# Patient Record
Sex: Female | Born: 1950 | Race: White | Hispanic: No | Marital: Married | Smoking: Never smoker
Health system: Southern US, Community
[De-identification: ages and names within clinical notes are randomized; demographics above are authoritative.]

## PROBLEM LIST (undated history)

## (undated) DIAGNOSIS — Z800 Family history of malignant neoplasm of digestive organs: Secondary | ICD-10-CM

## (undated) DIAGNOSIS — G500 Trigeminal neuralgia: Secondary | ICD-10-CM

## (undated) DIAGNOSIS — K5900 Constipation, unspecified: Secondary | ICD-10-CM

## (undated) DIAGNOSIS — I4891 Unspecified atrial fibrillation: Secondary | ICD-10-CM

## (undated) DIAGNOSIS — K219 Gastro-esophageal reflux disease without esophagitis: Secondary | ICD-10-CM

## (undated) DIAGNOSIS — R7303 Prediabetes: Secondary | ICD-10-CM

## (undated) DIAGNOSIS — I10 Essential (primary) hypertension: Secondary | ICD-10-CM

## (undated) DIAGNOSIS — M1710 Unilateral primary osteoarthritis, unspecified knee: Secondary | ICD-10-CM

## (undated) DIAGNOSIS — E559 Vitamin D deficiency, unspecified: Secondary | ICD-10-CM

## (undated) DIAGNOSIS — R002 Palpitations: Secondary | ICD-10-CM

## (undated) DIAGNOSIS — Z8042 Family history of malignant neoplasm of prostate: Secondary | ICD-10-CM

## (undated) DIAGNOSIS — N390 Urinary tract infection, site not specified: Secondary | ICD-10-CM

## (undated) DIAGNOSIS — M1990 Unspecified osteoarthritis, unspecified site: Secondary | ICD-10-CM

## (undated) DIAGNOSIS — E079 Disorder of thyroid, unspecified: Secondary | ICD-10-CM

## (undated) DIAGNOSIS — E669 Obesity, unspecified: Secondary | ICD-10-CM

## (undated) DIAGNOSIS — M2550 Pain in unspecified joint: Secondary | ICD-10-CM

## (undated) DIAGNOSIS — E739 Lactose intolerance, unspecified: Secondary | ICD-10-CM

## (undated) DIAGNOSIS — G4733 Obstructive sleep apnea (adult) (pediatric): Secondary | ICD-10-CM

## (undated) DIAGNOSIS — K5790 Diverticulosis of intestine, part unspecified, without perforation or abscess without bleeding: Secondary | ICD-10-CM

## (undated) DIAGNOSIS — K589 Irritable bowel syndrome without diarrhea: Secondary | ICD-10-CM

## (undated) DIAGNOSIS — E7800 Pure hypercholesterolemia, unspecified: Secondary | ICD-10-CM

## (undated) DIAGNOSIS — F419 Anxiety disorder, unspecified: Secondary | ICD-10-CM

## (undated) DIAGNOSIS — Z803 Family history of malignant neoplasm of breast: Secondary | ICD-10-CM

## (undated) DIAGNOSIS — C801 Malignant (primary) neoplasm, unspecified: Secondary | ICD-10-CM

## (undated) DIAGNOSIS — Z808 Family history of malignant neoplasm of other organs or systems: Secondary | ICD-10-CM

## (undated) DIAGNOSIS — K859 Acute pancreatitis without necrosis or infection, unspecified: Secondary | ICD-10-CM

## (undated) DIAGNOSIS — I499 Cardiac arrhythmia, unspecified: Secondary | ICD-10-CM

## (undated) HISTORY — DX: Anxiety disorder, unspecified: F41.9

## (undated) HISTORY — DX: Disorder of thyroid, unspecified: E07.9

## (undated) HISTORY — DX: Pure hypercholesterolemia, unspecified: E78.00

## (undated) HISTORY — DX: Acute pancreatitis without necrosis or infection, unspecified: K85.90

## (undated) HISTORY — PX: KNEE ARTHROSCOPY: SUR90

## (undated) HISTORY — DX: Unspecified atrial fibrillation: I48.91

## (undated) HISTORY — DX: Unilateral primary osteoarthritis, unspecified knee: M17.10

## (undated) HISTORY — DX: Family history of malignant neoplasm of breast: Z80.3

## (undated) HISTORY — DX: Prediabetes: R73.03

## (undated) HISTORY — DX: Obesity, unspecified: E66.9

## (undated) HISTORY — DX: Diverticulosis of intestine, part unspecified, without perforation or abscess without bleeding: K57.90

## (undated) HISTORY — DX: Obstructive sleep apnea (adult) (pediatric): G47.33

## (undated) HISTORY — DX: Constipation, unspecified: K59.00

## (undated) HISTORY — DX: Lactose intolerance, unspecified: E73.9

## (undated) HISTORY — DX: Family history of malignant neoplasm of digestive organs: Z80.0

## (undated) HISTORY — DX: Cardiac arrhythmia, unspecified: I49.9

## (undated) HISTORY — DX: Palpitations: R00.2

## (undated) HISTORY — DX: Vitamin D deficiency, unspecified: E55.9

## (undated) HISTORY — DX: Irritable bowel syndrome, unspecified: K58.9

## (undated) HISTORY — DX: Gastro-esophageal reflux disease without esophagitis: K21.9

## (undated) HISTORY — DX: Essential (primary) hypertension: I10

## (undated) HISTORY — PX: COLONOSCOPY: SHX174

## (undated) HISTORY — DX: Unspecified osteoarthritis, unspecified site: M19.90

## (undated) HISTORY — PX: TONSILLECTOMY: SUR1361

## (undated) HISTORY — DX: Family history of malignant neoplasm of prostate: Z80.42

## (undated) HISTORY — DX: Trigeminal neuralgia: G50.0

## (undated) HISTORY — PX: BLADDER REPAIR: SHX76

## (undated) HISTORY — DX: Pain in unspecified joint: M25.50

## (undated) HISTORY — PX: CEREBRAL MICROVASCULAR DECOMPRESSION: SHX1328

## (undated) HISTORY — PX: ESOPHAGOGASTRODUODENOSCOPY: SHX1529

## (undated) HISTORY — DX: Urinary tract infection, site not specified: N39.0

## (undated) HISTORY — PX: KNEE ARTHROSCOPY: SHX127

## (undated) HISTORY — DX: Family history of malignant neoplasm of other organs or systems: Z80.8

---

## 1978-07-13 HISTORY — PX: ABDOMINAL HYSTERECTOMY: SHX81

## 2019-02-10 ENCOUNTER — Encounter: Payer: Self-pay | Admitting: Internal Medicine

## 2019-03-14 ENCOUNTER — Ambulatory Visit: Payer: Self-pay | Admitting: Internal Medicine

## 2019-04-17 ENCOUNTER — Ambulatory Visit (INDEPENDENT_AMBULATORY_CARE_PROVIDER_SITE_OTHER): Payer: Medicare Other | Admitting: Internal Medicine

## 2019-04-17 ENCOUNTER — Encounter: Payer: Self-pay | Admitting: Internal Medicine

## 2019-04-17 VITALS — BP 124/70 | HR 58 | Temp 98.3°F | Ht 64.0 in | Wt 248.5 lb

## 2019-04-17 DIAGNOSIS — K5732 Diverticulitis of large intestine without perforation or abscess without bleeding: Secondary | ICD-10-CM | POA: Diagnosis not present

## 2019-04-17 DIAGNOSIS — Z800 Family history of malignant neoplasm of digestive organs: Secondary | ICD-10-CM

## 2019-04-17 DIAGNOSIS — R1030 Lower abdominal pain, unspecified: Secondary | ICD-10-CM | POA: Diagnosis not present

## 2019-04-17 DIAGNOSIS — K219 Gastro-esophageal reflux disease without esophagitis: Secondary | ICD-10-CM

## 2019-04-17 DIAGNOSIS — Z7901 Long term (current) use of anticoagulants: Secondary | ICD-10-CM

## 2019-04-17 MED ORDER — DICYCLOMINE HCL 10 MG PO CAPS: 10.0000 mg | ORAL_CAPSULE | Freq: Three times a day (TID) | ORAL | 2 refills | Status: DC | PRN

## 2019-04-17 MED ORDER — SUPREP BOWEL PREP KIT 17.5-3.13-1.6 GM/177ML PO SOLN: 1.0000 | ORAL | 0 refills | Status: DC

## 2019-04-17 MED ORDER — AMOXICILLIN-POT CLAVULANATE 875-125 MG PO TABS: 1.0000 | ORAL_TABLET | Freq: Two times a day (BID) | ORAL | 0 refills | Status: AC

## 2019-04-17 NOTE — Progress Notes (Signed)
Subjective:    Patient ID: Lisa Cardenas, female    DOB: [birth year], 68 y.o.   MRN: [id number]  HPI Lisa Cardenas is a 68 year old female with past medical history of colonic diverticulosis with history of diverticulitis, GERD, history of atrial fibrillation currently on Xarelto, history of hypertension, history of hyperlipidemia, history of thyroid disease, obesity who is seen to establish care for colon cancer screening but also to discuss lower abdominal pain and loose stools.  She is here alone today.  She reports that for the past week she has been having lower abdominal pain and discomfort.  She reports she generally just does not feel well.  She has noted some urgency and looser stools.  Occurring once or twice a day.  Worse with heavy or fatty foods.  She has lost about 10 pounds.  No blood in her stool or melena.  In the past she has been treated with Cipro and metronidazole for similar symptoms and her symptoms have improved.  She reports her reflux is under good control on Nexium 40 mg daily.  She will occasionally use a second dose if heartburn is troublesome.  No dysphagia or odynophagia.  Her mother had colon cancer in her late 53s.  She also has 2 aunts on her maternal side who have had colon cancer.  Her last colonoscopy was performed by Dr. West Carbo on 09/17/2010.  This showed no polypoid disease or colitis.  Diverticulosis in the sigmoid.  She had an EGD performed also on 09/17/2010 which showed a 5 cm hiatal hernia with erosions and marked reflux.  Biopsies were performed for Barrett's esophagus.  These showed mild chronic cardio gastritis and esophagitis.  There was no evidence for Barrett's esophagus.   Review of Systems As per HPI, otherwise negative  Current Medications, Allergies, Past Medical History, Past Surgical History, Family History and Social History were reviewed in [employer] record.     Objective:   Physical Exam BP 124/70   Pulse  (!) 58   Temp 98.3 F (36.8 C)   Ht 5\' 4"  (1.626 m)   Wt 248 lb 8 oz (112.7 kg)   BMI 42.65 kg/m  Gen: awake, alert, NAD HEENT: anicteric, op clear CV: RRR, no mrg Pulm: CTA b/l Abd: soft, obese, mild lower abdominal tenderness with deep palpation, nondistended, +BS throughout Ext: no c/c/e Neuro: nonfocal      Assessment & Plan:  68 year old female with past medical history of colonic diverticulosis with history of diverticulitis, GERD, history of atrial fibrillation currently on Xarelto, history of hypertension, history of hyperlipidemia, history of thyroid disease, obesity who is seen to establish care for colon cancer screening but also to discuss lower abdominal pain and loose stools.   1.  Lower abdominal pain/history of diverticulitis --she may have early mild diverticulitis.  I am going to treat her for this prior to screening colonoscopy. --Augmentin 875 mg twice daily x7 days --Bentyl 10 to 20 mg every 8 hours as needed lower abdominal crampy pain --She is asked to notify me if symptoms fail to improve  2.  Family history of colon cancer --screening colonoscopy recommended after treatment for diverticulitis.  We discussed the risk, benefits and alternatives and she is agreeable wishes to proceed.  Her Xarelto will need to be held and we will contact her cardiology who prescribes this medication. --Colonoscopy for screening  Will hold Xarelto 2 days prior to endoscopic procedures - will instruct when and how to resume after procedure.  Benefits and risks of procedure explained including risks of bleeding, perforation, infection, missed lesions, reactions to medications and possible need for hospitalization and surgery for complications. Additional rare but real risk of stroke or other vascular clotting events off Xarelto also explained and need to seek urgent help if any signs of these problems occur. Will communicate by phone or EMR with patient's  prescribing provider to confirm  that holding Xarelto is reasonable in this case.   3.  GERD with history of hiatal hernia --no alarm symptoms at present.  She had an upper endoscopy in 2012 without evidence of Barrett's esophagus, biopsies were performed and negative for Barrett's.  Continue Nexium 40 mg daily

## 2019-04-17 NOTE — Patient Instructions (Addendum)
We have sent the following medications to your pharmacy for you to pick up at your convenience: Augmentin 875 mg twice daily x 7 days Bentyl 10-20 mg every 8 hours as needed for lower abdominal pain/spasm  You have been scheduled for a colonoscopy. Please follow written instructions given to you at your visit today.  Please pick up your prep supplies at the pharmacy within the next 1-3 days. If you use inhalers (even only as needed), please bring them with you on the day of your procedure.  If you are age 7 or older, your body mass index should be between 23-30. Your Body mass index is 42.65 kg/m. If this is out of the aforementioned range listed, please consider follow up with your Primary Care Provider.  If you are age 9 or younger, your body mass index should be between 19-25. Your Body mass index is 42.65 kg/m. If this is out of the aformentioned range listed, please consider follow up with your Primary Care Provider.

## 2019-04-24 ENCOUNTER — Telehealth: Payer: Self-pay | Admitting: Internal Medicine

## 2019-04-24 NOTE — Telephone Encounter (Signed)
Pt reported that she is still experiencing diarrhea and pain on her right side.  Please advise.

## 2019-04-25 NOTE — Telephone Encounter (Signed)
If pain is now gone, then proceed to colonoscopy as scheduled If pain returns before this, I recommend CT scan abd/pelvis with contrast

## 2019-04-25 NOTE — Telephone Encounter (Signed)
Pt states she had some diarrhea and pain on her right side yesterday but today she is better. Discussed with her she can take Imodium OTC for diarrhea. Pt finished the augmentin that she was given and states she does feel better since completing the antibiotic. Let her know this information will be sent to Dr. Hilarie Fredrickson and if he has any further recommendations she will be called back.

## 2019-04-26 ENCOUNTER — Telehealth: Payer: Self-pay | Admitting: *Deleted

## 2019-04-26 NOTE — Telephone Encounter (Signed)
Pt scheduled to see Dr. Hilarie Fredrickson 05/11/19@10 :10am. Pt aware of appt.

## 2019-04-26 NOTE — Telephone Encounter (Signed)
Dr Donn Pierini from [facility] sent fax on 04/26/19 with the following:  "Dear [facility],  This is to inform you that the above mentioned patient has been cleared for her upcoming procedure. I feel this patient is low risk for a cardiac standpoint to proceed with colonoscopy with moderate sedation. She should hold her Xarelto 48 hours prior to the procedure and resume it 24 hours after the procedure.  Please do not hesitate to call me if I could be of further assistance.  Sincerely, Susie Cassette, MD [facility]"  I have spoken to patient to advise that per Dr Dorrene German, she may hold Xarelto 48 hours prior to her procedure and resume 24 hours after as long as okayed by Dr Hilarie Fredrickson. She verbalizes understanding.  Please look under "media" tab in Epic for original documentation.

## 2019-05-25 ENCOUNTER — Other Ambulatory Visit: Payer: Self-pay

## 2019-05-25 ENCOUNTER — Ambulatory Visit (AMBULATORY_SURGERY_CENTER): Payer: Medicare Other | Admitting: Internal Medicine

## 2019-05-25 ENCOUNTER — Encounter: Payer: Self-pay | Admitting: Internal Medicine

## 2019-05-25 VITALS — BP 110/49 | HR 56 | Temp 97.9°F | Resp 14 | Ht 64.0 in | Wt 248.0 lb

## 2019-05-25 DIAGNOSIS — Z800 Family history of malignant neoplasm of digestive organs: Secondary | ICD-10-CM | POA: Diagnosis present

## 2019-05-25 DIAGNOSIS — D120 Benign neoplasm of cecum: Secondary | ICD-10-CM

## 2019-05-25 DIAGNOSIS — D122 Benign neoplasm of ascending colon: Secondary | ICD-10-CM

## 2019-05-25 DIAGNOSIS — D124 Benign neoplasm of descending colon: Secondary | ICD-10-CM | POA: Diagnosis not present

## 2019-05-25 MED ORDER — SODIUM CHLORIDE 0.9 % IV SOLN: 500.0000 mL | Freq: Once | INTRAVENOUS | Status: DC

## 2019-05-25 NOTE — Progress Notes (Signed)
To PACU, VSS. Report to [email or web address] 

## 2019-05-25 NOTE — Progress Notes (Signed)
Called to room to assist during endoscopic procedure.  Patient ID and intended procedure confirmed with present staff. Received instructions for my participation in the procedure from the performing physician.  

## 2019-05-25 NOTE — Op Note (Signed)
[place] Patient Name: Lisa Cardenas Procedure Date: 05/25/2019 1:41 PM MRN: [id number] Endoscopist: Jerene Bears , MD Age: 68 Referring MD:  Date of Birth: 01/06/1951 Gender: Female Account #: [id number] Procedure:                Colonoscopy Indications:              Screening in patient at increased risk: Family                            history of 1st-degree relative with colorectal                            cancer before age 51 years, Last colonoscopy: 2012 Medicines:                Monitored Anesthesia Care Procedure:                Pre-Anesthesia Assessment:                           - Prior to the procedure, a History and Physical                            was performed, and patient medications and                            allergies were reviewed. The patient's tolerance of                            previous anesthesia was also reviewed. The risks                            and benefits of the procedure and the sedation                            options and risks were discussed with the patient.                            All questions were answered, and informed consent                            was obtained. Prior Anticoagulants: The patient has                            taken Xarelto (rivaroxaban), last dose was 2 days                            prior to procedure. ASA Grade Assessment: III - A                            patient with severe systemic disease. After                            reviewing the risks and benefits, the patient was  deemed in satisfactory condition to undergo the                            procedure.                           After obtaining informed consent, the colonoscope                            was passed under direct vision. Throughout the                            procedure, the patient's blood pressure, pulse, and                            oxygen saturations were monitored continuously. The                             Colonoscope was introduced through the anus and                            advanced to the cecum, identified by appendiceal                            orifice and ileocecal valve. The colonoscopy was                            performed without difficulty. The patient tolerated                            the procedure well. The quality of the bowel                            preparation was good. The ileocecal valve,                            appendiceal orifice, and rectum were photographed. Scope In: 1:44:51 PM Scope Out: 2:10:31 PM Scope Withdrawal Time: 0 hours 22 minutes 24 seconds  Total Procedure Duration: 0 hours 25 minutes 40 seconds  Findings:                 The digital rectal exam was normal.                           Three sessile polyps were found in the cecum. The                            polyps were 3 to 5 mm in size. These polyps were                            removed with a cold snare. Resection and retrieval                            were complete.  A 4 mm polyp was found in the ileocecal valve. The                            polyp was sessile. The polyp was removed with a                            cold biopsy forceps. Resection and retrieval were                            complete.                           Two sessile polyps were found in the ascending                            colon. The polyps were 4 to 5 mm in size. These                            polyps were removed with a cold snare. Resection                            and retrieval were complete.                           Two sessile polyps were found in the descending                            colon. The polyps were 3 to 4 mm in size. These                            polyps were removed with a cold snare. Resection                            and retrieval were complete.                           Multiple small-mouthed diverticula were found in                             the sigmoid colon. Petechia were visualized in                            association with the diverticular opening.                           Internal hemorrhoids were found during                            retroflexion. The hemorrhoids were small. Complications:            No immediate complications. Estimated Blood Loss:     Estimated blood loss was minimal. Impression:               - Three 3 to 5 mm polyps in the cecum, removed  with                            a cold snare. Resected and retrieved.                           - One 4 mm polyp at the ileocecal valve, removed                            with a cold biopsy forceps. Resected and retrieved.                           - Two 4 to 5 mm polyps in the ascending colon,                            removed with a cold snare. Resected and retrieved.                           - Two 3 to 4 mm polyps in the descending colon,                            removed with a cold snare. Resected and retrieved.                           - Moderate diverticulosis in the sigmoid colon.                            Petechia were visualized in association with the                            diverticular opening.                           - Internal hemorrhoids. Recommendation:           - Patient has a contact number available for                            emergencies. The signs and symptoms of potential                            delayed complications were discussed with the                            patient. Return to normal activities tomorrow.                            Written discharge instructions were provided to the                            patient.                           - Resume previous diet.                           -  Continue present medications.                           - Await pathology results.                           - Repeat colonoscopy is recommended for                            surveillance. The  colonoscopy date will be                            determined after pathology results from today's                            exam become available for review. Jerene Bears, MD 05/25/2019 2:19:00 PM This report has been signed electronically.

## 2019-05-25 NOTE — Progress Notes (Signed)
JB- Temp CW- Vitals 

## 2019-05-25 NOTE — Patient Instructions (Addendum)
PLease see handouts given to you on Polyps, Diverticulosis and Hemorrhoids. Resume xarelto tomorrow.     YOU HAD AN ENDOSCOPIC PROCEDURE TODAY AT [facility]:   Refer to the procedure report that was given to you for any specific questions about what was found during the examination.  If the procedure report does not answer your questions, please call your gastroenterologist to clarify.  If you requested that your care partner not be given the details of your procedure findings, then the procedure report has been included in a sealed envelope for you to review at your convenience later.  YOU SHOULD EXPECT: Some feelings of bloating in the abdomen. Passage of more gas than usual.  Walking can help get rid of the air that was put into your GI tract during the procedure and reduce the bloating. If you had a lower endoscopy (such as a colonoscopy or flexible sigmoidoscopy) you may notice spotting of blood in your stool or on the toilet paper. If you underwent a bowel prep for your procedure, you may not have a normal bowel movement for a few days.  Please Note:  You might notice some irritation and congestion in your nose or some drainage.  This is from the oxygen used during your procedure.  There is no need for concern and it should clear up in a day or so.  SYMPTOMS TO REPORT IMMEDIATELY:   Following lower endoscopy (colonoscopy or flexible sigmoidoscopy):  Excessive amounts of blood in the stool  Significant tenderness or worsening of abdominal pains  Swelling of the abdomen that is new, acute  Fever of 100F or higher   For urgent or emergent issues, a gastroenterologist can be reached at any hour by calling [phone].   DIET:  We do recommend a small meal at first, but then you may proceed to your regular diet.  Drink plenty of fluids but you should avoid alcoholic beverages for 24 hours.  ACTIVITY:  You should plan to take it easy for the rest of today and you  should NOT DRIVE or use heavy machinery until tomorrow (because of the sedation medicines used during the test).    FOLLOW UP: Our staff will call the number listed on your records 48-72 hours following your procedure to check on you and address any questions or concerns that you may have regarding the information given to you following your procedure. If we do not reach you, we will leave a message.  We will attempt to reach you two times.  During this call, we will ask if you have developed any symptoms of COVID 19. If you develop any symptoms (ie: fever, flu-like symptoms, shortness of breath, cough etc.) before then, please call [phone].  If you test positive for Covid 19 in the 2 weeks post procedure, please call and report this information to [place].    If any biopsies were taken you will be contacted by phone or by letter within the next 1-3 weeks.  Please call us at [phone] if you have not heard about the biopsies in 3 weeks.    SIGNATURES/CONFIDENTIALITY: You and/or your care partner have signed paperwork which will be entered into your electronic medical record.  These signatures attest to the fact that that the information above on your After Visit Summary has been reviewed and is understood.  Full responsibility of the confidentiality of this discharge information lies with you and/or your care-partner.

## 2019-05-27 ENCOUNTER — Other Ambulatory Visit: Payer: Self-pay | Admitting: Internal Medicine

## 2019-05-29 ENCOUNTER — Telehealth: Payer: Self-pay

## 2019-05-29 NOTE — Telephone Encounter (Signed)
  Follow up Call-  Call back number 05/25/2019  Post procedure Call Back phone  # [phone]  Permission to leave phone message No  Some recent data might be hidden     Patient questions:  Do you have a fever, pain , or abdominal swelling? No. Pain Score  0 *  Have you tolerated food without any problems? Yes.    Have you been able to return to your normal activities? Yes.    Do you have any questions about your discharge instructions: Diet   No. Medications  No. Follow up visit  No.  Do you have questions or concerns about your Care? No.  Actions: * If pain score is 4 or above: No action needed, pain <4.  1. Have you developed a fever since your procedure? no  2.   Have you had an respiratory symptoms (SOB or cough) since your procedure? no  3.   Have you tested positive for COVID 19 since your procedure no  4.   Have you had any family members/close contacts diagnosed with the COVID 19 since your procedure?  no   If yes to any of these questions please route to Joylene John, RN and Alphonsa Gin, Therapist, sports.

## 2019-05-30 ENCOUNTER — Encounter: Payer: Self-pay | Admitting: Internal Medicine

## 2019-06-26 ENCOUNTER — Telehealth: Payer: Self-pay | Admitting: Internal Medicine

## 2019-06-26 MED ORDER — AMOXICILLIN-POT CLAVULANATE 875-125 MG PO TABS: 1.0000 | ORAL_TABLET | Freq: Two times a day (BID) | ORAL | 0 refills | Status: AC

## 2019-06-26 MED ORDER — METRONIDAZOLE 250 MG PO TABS: 250.0000 mg | ORAL_TABLET | Freq: Three times a day (TID) | ORAL | 0 refills | Status: DC

## 2019-06-26 NOTE — Telephone Encounter (Signed)
The pt is calling with continued lower abd pain and cramping.  She has a history of diverticulitis. Colon done on 11/12.  She would like to have abx called in for diverticulitis.  Please advise

## 2019-06-26 NOTE — Telephone Encounter (Signed)
Cipro 500 mg BID Flagyl 250 mg TID  --both x 7 days  She should call if symptoms not better and completely resolved

## 2019-06-26 NOTE — Telephone Encounter (Signed)
The pt has been advised and flagyl prescription was cancelled.

## 2019-06-26 NOTE — Telephone Encounter (Signed)
The pt has been advised but states she has afib and cannot take cipro.  She says she was given augmentin with the last flare she had with the flagyl. Please advise

## 2019-06-26 NOTE — Telephone Encounter (Signed)
Ok change to augmentin 850 mg BID x 7 days (this will replace both cipro and flagyl)

## 2019-10-06 ENCOUNTER — Other Ambulatory Visit: Payer: Self-pay | Admitting: General Surgery

## 2019-10-25 ENCOUNTER — Telehealth: Payer: Self-pay | Admitting: Oncology

## 2019-10-25 NOTE — Telephone Encounter (Signed)
Received an urgent referral from Dr. Donne Hazel for a dx of breast cancer. Pt has been cld and scheduled to see Dr. Jana Hakim on 4/15 at 4pm w/labs at 330pm. Pt aware to arrive 15 minutes early.

## 2019-10-26 ENCOUNTER — Other Ambulatory Visit: Payer: Self-pay | Admitting: Licensed Clinical Social Worker

## 2019-10-26 ENCOUNTER — Telehealth: Payer: Self-pay | Admitting: Oncology

## 2019-10-26 ENCOUNTER — Encounter: Payer: Self-pay | Admitting: Licensed Clinical Social Worker

## 2019-10-26 ENCOUNTER — Other Ambulatory Visit: Payer: Self-pay | Admitting: General Surgery

## 2019-10-26 ENCOUNTER — Encounter: Payer: Self-pay | Admitting: *Deleted

## 2019-10-26 ENCOUNTER — Inpatient Hospital Stay: Payer: Medicare Other

## 2019-10-26 ENCOUNTER — Other Ambulatory Visit: Payer: Self-pay

## 2019-10-26 ENCOUNTER — Inpatient Hospital Stay (HOSPITAL_BASED_OUTPATIENT_CLINIC_OR_DEPARTMENT_OTHER): Payer: Medicare Other | Admitting: Licensed Clinical Social Worker

## 2019-10-26 ENCOUNTER — Telehealth: Payer: Self-pay | Admitting: *Deleted

## 2019-10-26 ENCOUNTER — Inpatient Hospital Stay: Payer: Medicare Other | Attending: Oncology | Admitting: Oncology

## 2019-10-26 ENCOUNTER — Other Ambulatory Visit: Payer: Self-pay | Admitting: *Deleted

## 2019-10-26 ENCOUNTER — Other Ambulatory Visit: Payer: Medicare Other

## 2019-10-26 VITALS — BP 155/70 | HR 58 | Temp 98.7°F | Resp 18 | Ht 64.0 in | Wt 240.3 lb

## 2019-10-26 DIAGNOSIS — E7800 Pure hypercholesterolemia, unspecified: Secondary | ICD-10-CM | POA: Insufficient documentation

## 2019-10-26 DIAGNOSIS — C50412 Malignant neoplasm of upper-outer quadrant of left female breast: Secondary | ICD-10-CM

## 2019-10-26 DIAGNOSIS — Z803 Family history of malignant neoplasm of breast: Secondary | ICD-10-CM

## 2019-10-26 DIAGNOSIS — K219 Gastro-esophageal reflux disease without esophagitis: Secondary | ICD-10-CM

## 2019-10-26 DIAGNOSIS — Z800 Family history of malignant neoplasm of digestive organs: Secondary | ICD-10-CM

## 2019-10-26 DIAGNOSIS — I4891 Unspecified atrial fibrillation: Secondary | ICD-10-CM | POA: Diagnosis not present

## 2019-10-26 DIAGNOSIS — Z170 Estrogen receptor positive status [ER+]: Secondary | ICD-10-CM | POA: Diagnosis not present

## 2019-10-26 DIAGNOSIS — Z6841 Body Mass Index (BMI) 40.0 and over, adult: Secondary | ICD-10-CM | POA: Insufficient documentation

## 2019-10-26 DIAGNOSIS — Z8042 Family history of malignant neoplasm of prostate: Secondary | ICD-10-CM | POA: Insufficient documentation

## 2019-10-26 DIAGNOSIS — Z79899 Other long term (current) drug therapy: Secondary | ICD-10-CM | POA: Insufficient documentation

## 2019-10-26 DIAGNOSIS — Z808 Family history of malignant neoplasm of other organs or systems: Secondary | ICD-10-CM

## 2019-10-26 DIAGNOSIS — Z90710 Acquired absence of both cervix and uterus: Secondary | ICD-10-CM

## 2019-10-26 DIAGNOSIS — I482 Chronic atrial fibrillation, unspecified: Secondary | ICD-10-CM

## 2019-10-26 DIAGNOSIS — C50812 Malignant neoplasm of overlapping sites of left female breast: Secondary | ICD-10-CM | POA: Diagnosis present

## 2019-10-26 DIAGNOSIS — Z7901 Long term (current) use of anticoagulants: Secondary | ICD-10-CM | POA: Insufficient documentation

## 2019-10-26 DIAGNOSIS — Z8616 Personal history of COVID-19: Secondary | ICD-10-CM

## 2019-10-26 LAB — CBC WITH DIFFERENTIAL (CANCER CENTER ONLY)
Abs Immature Granulocytes: 0.01 10*3/uL (ref 0.00–0.07)
Basophils Absolute: 0.1 10*3/uL (ref 0.0–0.1)
Basophils Relative: 1 %
Eosinophils Absolute: 0.1 10*3/uL (ref 0.0–0.5)
Eosinophils Relative: 2 %
HCT: 44 % (ref 36.0–46.0)
Hemoglobin: 14.2 g/dL (ref 12.0–15.0)
Immature Granulocytes: 0 %
Lymphocytes Relative: 24 %
Lymphs Abs: 1.6 10*3/uL (ref 0.7–4.0)
MCH: 28.1 pg (ref 26.0–34.0)
MCHC: 32.3 g/dL (ref 30.0–36.0)
MCV: 87 fL (ref 80.0–100.0)
Monocytes Absolute: 0.3 10*3/uL (ref 0.1–1.0)
Monocytes Relative: 4 %
Neutro Abs: 4.6 10*3/uL (ref 1.7–7.7)
Neutrophils Relative %: 69 %
Platelet Count: 219 10*3/uL (ref 150–400)
RBC: 5.06 MIL/uL (ref 3.87–5.11)
RDW: 14.7 % (ref 11.5–15.5)
WBC Count: 6.7 10*3/uL (ref 4.0–10.5)
nRBC: 0 % (ref 0.0–0.2)

## 2019-10-26 LAB — CMP (CANCER CENTER ONLY)
ALT: 15 U/L (ref 0–44)
AST: 11 U/L — ABNORMAL LOW (ref 15–41)
Albumin: 4.1 g/dL (ref 3.5–5.0)
Alkaline Phosphatase: 79 U/L (ref 38–126)
Anion gap: 8 (ref 5–15)
BUN: 12 mg/dL (ref 8–23)
CO2: 30 mmol/L (ref 22–32)
Calcium: 10 mg/dL (ref 8.9–10.3)
Chloride: 100 mmol/L (ref 98–111)
Creatinine: 0.78 mg/dL (ref 0.44–1.00)
GFR, Est AFR Am: >60 mL/min (ref >60)
GFR, Estimated: >60 mL/min (ref >60)
Glucose, Bld: 123 mg/dL — ABNORMAL HIGH (ref 70–99)
Potassium: 3.6 mmol/L (ref 3.5–5.1)
Sodium: 138 mmol/L (ref 135–145)
Total Bilirubin: 0.4 mg/dL (ref 0.3–1.2)
Total Protein: 7.4 g/dL (ref 6.5–8.1)

## 2019-10-26 LAB — GENETIC SCREENING ORDER

## 2019-10-26 NOTE — Progress Notes (Signed)
[place]  Telephone:(336) [phone] Fax:(336) [phone]     ID: MARIANN PALO DOB: 11-25-1950  MR#: [id number]  OQH#:[id number]  Patient Care Team: Moshe Cipro, MD as PCP - General (Internal Medicine) Rolm Bookbinder, MD as Consulting Physician (General Surgery) Ziasia Lenoir, Virgie Dad, MD as Consulting Physician (Oncology) Chauncey Cruel, MD OTHER MD:  CHIEF COMPLAINT: Estrogen receptor positive lobular breast cancer  CURRENT TREATMENT: Awaiting definitive surgery   HISTORY OF CURRENT ILLNESS: ZAKIYAH DIOP [pronounced [id number]] had routine screening mammography on 08/10/2019 showing a possible abnormality in the left breast. She underwent left diagnostic mammography with tomography and left breast ultrasonography in [place], [place] on 09/11/2019 showing: scattered fibroglandular densities asymmetry in upper-outer left breast with no definitive sonographic correlate.  Accordingly on 10/06/2019 she proceeded to biopsy of the left breast area in question. The pathology from this procedure ([id number] at [facility] in [place]) ) showed: invasive lobular carcinoma, E-cadherin negative, grade 1. Prognostic indicators significant for: estrogen receptor, greater than 90% positive and progesterone receptor, greater than 90% positive, both with strong staining intensity proliferation marker Ki67 at 5-7%. HER2 negative by immunohistochemistry.  The patient's subsequent history is as detailed below.   INTERVAL HISTORY: Tya was evaluated in the breast cancer clinic on 10/26/2019 accompanied by her husband Clair Gulling.  She has previously met with Dr. Donne Hazel who is setting her up for breast MRI.  Of note she was diagnosed with Covid-19 on 09/03/2019. She has since received the J&J vaccine.   REVIEW OF SYSTEMS: There were no specific symptoms leading to the original mammogram, which was routinely scheduled. The patient denies unusual headaches, visual changes, nausea,  vomiting, stiff neck, dizziness, or gait imbalance. There has been no cough, phlegm production, or pleurisy, no chest pain or pressure, and no change in bowel or bladder habits. The patient denies fever, rash, bleeding, unexplained fatigue or unexplained weight loss.  She has some knee pain problems and this limits her exercise.  She enjoys swimming but has not been able to do that during the pandemic.  A detailed review of systems was otherwise entirely negative.   PAST MEDICAL HISTORY: Past Medical History:  Diagnosis Date  . A-fib [facility])    no issues since May 2020  . Arrhythmia   . Diverticulosis   . Elevated cholesterol   . Family history of breast cancer   . Family history of melanoma   . Family history of pancreatic cancer   . Family history of prostate cancer   . Hypertension   . Obesity   . Pancreatitis   . Thyroid disease   . Trigeminal neuralgia   . UTI (urinary tract infection)   Chronic anticoagulation secondary to atrial fibrillation, GERD, irritable bowel syndrome  PAST SURGICAL HISTORY: Past Surgical History:  Procedure Laterality Date  . ABDOMINAL HYSTERECTOMY  [facility]  [facility]  [facility]OS[facility]     [facility]  . KNEE ARTHROSCOPY Left   . TONSILLECTOMY    Ovaries still in place, right fallopian tube removed, status post cystocele repair  FAMILY HISTORY: Family History  Problem Relation Age of Onset  . Colon cancer Mother        dx 47  . Breast cancer Maternal Grandmother   . Breast cancer Maternal Aunt   . Prostate cancer Maternal Uncle   . Breast cancer Paternal Aunt   . Melanoma Paternal Uncle   . Breast cancer Maternal Aunt   . Leukemia Maternal Uncle   .  Breast cancer Paternal Aunt   . Colon cancer Paternal Aunt   . Melanoma Daughter   The family history is best documented in the genetics note but in brief the patient's father died at the age of 74 from sepsis and the patient's mother at the age of 46 from COPD.  The patient's mother  had colon cancer.  The maternal grandmother had breast cancer in her late 53s and 2 maternal aunts had breast cancer and 1 maternal cousin had breast cancer and later developed pancreatic cancer.  On the father's side 2 paternal aunts had breast cancer, one paternal aunt had colon cancer, all older than 35 years old, 1 paternal uncle had leukemia and one paternal uncle had prostate cancer   GYNECOLOGIC HISTORY:  No LMP recorded. Menarche: 69 years old Age at first live birth: 69 years old [place] P 3 LMP hysterectomy age 61 Contraceptive used oral contraceptives only a few months, without complication HRT estrogen only for 7 to 10 years  Hysterectomy? yes BSO?  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  Her husband of more than 58 years, Clair Gulling, has a Oceanographer in Engineer, mining and work in Social research officer, government for [employer].  He is retired and now Health and safety inspector.  Daughter Cyril Mourning lives in [place] and owns and runs a center for pediatric treatment as well as a school for autistic children's.  Daughter Loree Fee in [place] has a Oceanographer in Animal nutritionist studies and teaches teaching at [employer]; daughter Janett Billow lives in [place] and works at [employer] base there.  The patient has 3 grandchildren.  Is a member of the [employer] denomination.  ADVANCED DIRECTIVES: In the absence of any documents to the contrary the patient's husband is her healthcare power of attorney   HEALTH MAINTENANCE: Social History   Tobacco Use  . Smoking status: Never Smoker  . Smokeless tobacco: Never Used  Substance Use Topics  . Alcohol use: Not Currently  . Drug use: Not Currently     Colonoscopy: October 2020/ [facility]  PAP: 2016  Bone density: Remote   Allergies  Allergen Reactions  . Plaquenil [Hydroxychloroquine]     Increased heart rate    Current Outpatient Medications  Medication Sig Dispense Refill  .  carbamazepine (TEGRETOL) 200 MG tablet Take 200 mg by mouth daily with breakfast. 1/2 tablet daily    . esomeprazole (NEXIUM) 40 MG capsule Take 40 mg by mouth 2 (two) times daily as needed.    . gabapentin (NEURONTIN) 300 MG capsule Take 300 mg by mouth daily.    Marland Kitchen levothyroxine (SYNTHROID) 125 MCG tablet Take 137 mcg by mouth daily. Current dose 137 mcg daily    . LORazepam (ATIVAN) 0.5 MG tablet Take 0.5 mg by mouth as needed for anxiety.    . metoprolol succinate (TOPROL-XL) 100 MG 24 hr tablet Take 100 mg by mouth 2 (two) times daily.    Marland Kitchen olmesartan-hydrochlorothiazide (BENICAR HCT) 20-12.5 MG tablet Take 1 tablet by mouth daily.    . primidone (MYSOLINE) 50 MG tablet Take 50 mg by mouth daily.     . rivaroxaban (XARELTO) 20 MG TABS tablet Take 20 mg by mouth daily.    . sertraline (ZOLOFT) 50 MG tablet Take 50 mg by mouth daily.    . simvastatin (ZOCOR) 40 MG tablet Take 40 mg by mouth daily.     No current facility-administered medications for this  visit.    OBJECTIVE: White woman who appears younger than stated age  69:   10/26/19 1550  BP: (!) 155/70  Pulse: (!) 58  Resp: 18  Temp: 98.7 F (37.1 C)  SpO2: 93%     Body mass index is 41.25 kg/m.   Wt Readings from Last 3 Encounters:  10/26/19 240 lb 4.8 oz (109 kg)  05/25/19 248 lb (112.5 kg)  04/17/19 248 lb 8 oz (112.7 kg)      ECOG FS:1 - Symptomatic but completely ambulatory  Ocular: Sclerae unicteric, pupils round and equal Ear-nose-throat: Wearing a mask Lymphatic: No cervical or supraclavicular adenopathy Lungs no rales or rhonchi Heart regular rate and rhythm Abd soft, nontender, positive bowel sounds MSK no focal spinal tenderness, no joint edema Neuro: non-focal, well-oriented, appropriate affect Breasts: The right breast is unremarkable.  The left breast is status post recent biopsy.  There is a minimal ecchymosis.  There is no palpable mass.  There are no skin or nipple changes of concern.  Both  axillae are benign.   LAB RESULTS:  CMP     Component Value Date/Time   NA 138 10/26/2019 1507   K 3.6 10/26/2019 1507   CL 100 10/26/2019 1507   CO2 30 10/26/2019 1507   GLUCOSE 123 (H) 10/26/2019 1507   BUN 12 10/26/2019 1507   CREATININE 0.78 10/26/2019 1507   CALCIUM 10.0 10/26/2019 1507   PROT 7.4 10/26/2019 1507   ALBUMIN 4.1 10/26/2019 1507   AST 11 (L) 10/26/2019 1507   ALT 15 10/26/2019 1507   ALKPHOS 79 10/26/2019 1507   BILITOT 0.4 10/26/2019 1507   GFRNONAA >60 10/26/2019 1507   GFRAA >60 10/26/2019 1507    No results found for: TOTALPROTELP, ALBUMINELP, A1GS, A2GS, BETS, BETA2SER, GAMS, MSPIKE, SPEI  Lab Results  Component Value Date   WBC 6.7 10/26/2019   NEUTROABS 4.6 10/26/2019   HGB 14.2 10/26/2019   HCT 44.0 10/26/2019   MCV 87.0 10/26/2019   PLT 219 10/26/2019    No results found for: LABCA2  No components found for: EQASTM196  No results for input(s): INR in the last 168 hours.  No results found for: LABCA2  No results found for: QIW979  No results found for: GXQ119  No results found for: ERD408  No results found for: CA2729  No components found for: HGQUANT  No results found for: CEA1 / No results found for: CEA1   No results found for: AFPTUMOR  No results found for: CHROMOGRNA  No results found for: KPAFRELGTCHN, LAMBDASER, KAPLAMBRATIO (kappa/lambda light chains)  No results found for: HGBA, HGBA2QUANT, HGBFQUANT, HGBSQUAN (Hemoglobinopathy evaluation)   No results found for: LDH  No results found for: IRON, TIBC, IRONPCTSAT (Iron and TIBC)  No results found for: FERRITIN  Urinalysis No results found for: COLORURINE, APPEARANCEUR, LABSPEC, PHURINE, GLUCOSEU, HGBUR, BILIRUBINUR, KETONESUR, PROTEINUR, UROBILINOGEN, NITRITE, LEUKOCYTESUR   STUDIES: No results found.   ELIGIBLE FOR AVAILABLE RESEARCH PROTOCOL:AET  ASSESSMENT: 69 y.o.  [place], [place] woman status post left breast overlapping sites biopsy 10/06/2019  for a clinical TXN0, stage IA invasive lobular carcinoma, grade 1, E-cadherin negative, estrogen and progesterone receptor positive, HER-2 not amplified, with an MIB-1 of 5-7%.  (a) breast MRI  (1) genetics testing 10/26/2019  (2) definitive surgery pending  (3) chemotherapy not anticipated: Oncotype may be requested  (4) adjuvant radiation  (5) to start anastrozole at the completion of local treatment.    PLAN: I met today with Ellarose to review  her new diagnosis. Specifically we discussed the biology of her breast cancer, its diagnosis, staging, treatment  options and prognosis. We first reviewed the fact that cancer is not one disease but more than 100 different diseases and that it is important to keep them separate-- otherwise when friends and relatives discuss their own cancer experiences with Cliffie confusion can result. Similarly we explained that if breast cancer spreads to the bone or liver, the patient would not have bone cancer or liver cancer, but breast cancer in the bone and breast cancer in the liver: one cancer in three places-- not 3 different cancers which otherwise would have to be treated in 3 different ways.  We discussed the difference between local and systemic therapy. In terms of loco-regional treatment, lumpectomy plus radiation is equivalent to mastectomy as far as survival is concerned. For this reason, and because the cosmetic results are generally superior, we recommend breast conserving surgery.   Even though the patient lives in [place] she is considering getting treated here and I will arrange for consult with radiation oncology to begin those discussions.  We then discussed the rationale for systemic therapy. There is some risk that this cancer may have already spread to other parts of her body. Patients frequently ask at this point about bone scans, CAT scans and PET scans to find out if they have occult breast cancer somewhere else. The problem is that in  early stage disease we are much more likely to find false positives then true cancers and this would expose the patient to unnecessary procedures as well as unnecessary radiation. Scans cannot answer the question the patient really would like to know, which is whether she has microscopic disease elsewhere in her body. For those reasons we do not recommend them.  Of course we would proceed to aggressive evaluation of any symptoms that might suggest metastatic disease, but that is not the case here.  Next we went over the options for systemic therapy which are anti-estrogens, anti-HER-2 immunotherapy, and chemotherapy. Anisha does not meet criteria for anti-HER-2 immunotherapy. She is a good candidate for anti-estrogens.  The question of chemotherapy is more complicated. Chemotherapy is most effective in rapidly growing, aggressive tumors. It is much less effective in low-grade, slow growing cancers, like Audine 's. For that reason I do not anticipate that she will benefit from chemotherapy or need chemotherapy to have a good prognosis.  Nevertheless if the cancer is greater than 1 cm we are going to request an Oncotype from the definitive surgical sample, as suggested by NCCN guidelines. That will help [place] make a definitive decision regarding chemotherapy in this case.  The patient also qualifies for genetics testing. In patients who carry a deleterious mutation [for example in a  BRCA gene], the risk of a new breast cancer developing in the future may be sufficiently great that the patient may choose bilateral mastectomies. However if she wishes to keep her breasts in that situation it is safe to do so. That would require intensified screening, which generally means not only yearly mammography but a yearly breast MRI as well.  After today's discussion it seems clear that intensified screening would be her clear choice  We then briefly discussed antiestrogens and I particularly recommended anastrozole given  her cardiac history.  However she will not start this until after she is done with surgery and radiation and we will have time to review side effects toxicities and complications again at that time.  Tate has a good  understanding of the overall plan. She agrees with it. She knows the goal of treatment in her case is cure. She will call with any problems that may develop before her next visit here.  Total encounter time 70 minutes.Sarajane Jews C. Oluwademilade Mckiver, MD 10/26/2019 5:08 PM Medical Oncology and Hematology [facility] [address], [place] [address] Tel. [phone]    Fax. [phone]   This document serves as a record of services personally performed by Lurline Del, MD. It was created on his behalf by Wilburn Mylar, a trained medical scribe. The creation of this record is based on the scribe's personal observations and the provider's statements to them.   I, Lurline Del MD, have reviewed the above documentation for accuracy and completeness, and I agree with the above.    *Total Encounter Time as defined by the Centers for Medicare and Medicaid Services includes, in addition to the face-to-face time of a patient visit (documented in the note above) non-face-to-face time: obtaining and reviewing outside history, ordering and reviewing medications, tests or procedures, care coordination (communications with other health care professionals or caregivers) and documentation in the medical record.

## 2019-10-26 NOTE — Telephone Encounter (Signed)
I spoke to patient about coming in today at 3 for genetics.  She agreed.

## 2019-10-26 NOTE — Telephone Encounter (Signed)
I received a staff message from [facility] 4/15 stating to cancel her appts for today w/ Dr Jana Hakim.  I then called [facility] and spoke with [place] who has advised me that she will NOT be cancelling her appointments today w/ Magrinat.  IB message sent back to [facility] with this updated information

## 2019-10-27 LAB — SURGICAL PATHOLOGY

## 2019-10-27 NOTE — Progress Notes (Addendum)
REFERRING PROVIDER: Rolm Bookbinder, MD [address] [place],  [place] [address]  PRIMARY PROVIDER:  Moshe Cipro, MD  PRIMARY REASON FOR VISIT:  1. Malignant neoplasm of upper-outer quadrant of left breast in female, estrogen receptor positive ([place])   2. Family history of breast cancer   3. Family history of pancreatic cancer   4. Family history of prostate cancer   5. Family history of melanoma      HISTORY OF PRESENT ILLNESS:   Lisa Cardenas, a 69 y.o. female, was seen for a [place] cancer genetics consultation at the request of Dr. Currie Paris due to a personal and family history of cancer.  Lisa Cardenas presents to clinic today to discuss the possibility of a hereditary predisposition to cancer, genetic testing, and to further clarify her future cancer risks, as well as potential cancer risks for family members.   In 2021, at the age of 74, Lisa Cardenas was diagnosed with invasive lobular carcinoma of the left breast, ER/PR+, Her2-. The treatment plan includes surgery, adjuvant radiation and anastrozole.    CANCER HISTORY:  Oncology History   No history exists.     RISK FACTORS:  Menarche was at age 71.  First live birth at age 81.  OCP use for approximately 2 months Ovaries intact: yes.  Hysterectomy: yes.  Menopausal status: postmenopausal.  HRT use: 7 years Colonoscopy: yes; every 3 years. Mammogram within the last year: yes. Number of breast biopsies: 1. Up to date with pelvic exams: yes. Any excessive radiation exposure in the past: no  Past Medical History:  Diagnosis Date  . A-fib [facility])    no issues since May 2020  . Arrhythmia   . Diverticulosis   . Elevated cholesterol   . Family history of breast cancer   . Family history of melanoma   . Family history of pancreatic cancer   . Family history of prostate cancer   . Hypertension   . Obesity   . Pancreatitis   . Thyroid disease   . Trigeminal neuralgia   . UTI (urinary tract infection)      Past Surgical History:  Procedure Laterality Date  . ABDOMINAL HYSTERECTOMY  [facility]  [facility]  . KNEE ARTHROSCOPY Left   . TONSILLECTOMY      Social History   Socioeconomic History  . Marital status: Married    Spouse name: Not on file  . Number of children: Not on file  . Years of education: Not on file  . Highest education level: Not on file  Occupational History  . Not on file  Tobacco Use  . Smoking status: Never Smoker  . Smokeless tobacco: Never Used  Substance and Sexual Activity  . Alcohol use: Not Currently  . Drug use: Not Currently  . Sexual activity: Not on file  Other Topics Concern  . Not on file  Social History Narrative  . Not on file   Social Determinants of Health   Financial Resource Strain:   . Difficulty of Paying Living Expenses:   Food Insecurity:   . Worried About Charity fundraiser in the Last Year:   . Arboriculturist in the Last Year:   Transportation Needs:   . Film/video editor (Medical):   Marland Kitchen Lack of Transportation (Non-Medical):   Physical Activity:   . Days of Exercise per Week:   . Minutes of Exercise per Session:  Stress:   . Feeling of Stress :   Social Connections:   . Frequency of Communication with Friends and Family:   . Frequency of Social Gatherings with Friends and Family:   . Attends Religious Services:   . Active Member of Clubs or Organizations:   . Attends Archivist Meetings:   Marland Kitchen Marital Status:      FAMILY HISTORY:  We obtained a detailed, 4-generation family history.  Significant diagnoses are listed below: Family History  Problem Relation Age of Onset  . Colon cancer Mother        dx 106  . Breast cancer Maternal Grandmother   . Breast cancer Maternal Aunt   . Prostate cancer Maternal Uncle   . Breast cancer Paternal Aunt   . Melanoma Paternal Uncle   . Breast cancer Maternal Aunt   . Leukemia Maternal Uncle   . Breast cancer  Paternal Aunt   . Colon cancer Paternal Aunt   . Melanoma Daughter    Lisa Cardenas has 3 daughters, ages 66, 71 and 6. Her oldest daughter has had a few melanomas removed in areas that had sun exposure. Patient does not have siblings.  Lisa Cardenas mother was diagnosed with colon cancer at 5 and died recently at 42. Patient had 4 maternal aunts and 2 maternal uncles. One aunt had breast cancer in her 36s and lung cancer. Another aunt had breast cancer in her 63s. An uncle had prostate cancer in his 67s and another had leukemia in his 24s. A maternal cousin had breast cancer in her 64s, pancreatic cancer at 67 and died at 9. Maternal grandmother had breast cancer in her 63s, grandfather passed in his 66s as well.   Lisa Cardenas's father died at 43, no cancers. Patient had 4 paternal aunts and 1 paternal uncle. One of her aunts had breast cancer and colon cancer diagnosed in her 61s, and another aunt had breast in her 39s. An uncle had melanoma at 28 and died at 66. Paternal grandfather died "young" and grandmother died in her 29s.   Lisa Cardenas is unaware of previous family history of genetic testing for hereditary cancer risks. Patient's maternal ancestors are of [place] descent, and paternal ancestors are of [place] descent. There is no reported Ashkenazi Jewish ancestry. There is no known consanguinity.  GENETIC COUNSELING ASSESSMENT: Lisa Cardenas is a 69 y.o. female with a personal and family history of breast cancer and other cancers which is somewhat suggestive of a hereditary cancer syndrome and predisposition to cancer. We, therefore, discussed and recommended the following at today's visit.   DISCUSSION: We discussed that 5 - 10% of breast cancer is hereditary, with most cases associated with BRCA1/BRCA2 mutations.  There are other genes that can be associated with hereditary breast cancer syndromes.   We discussed that testing is beneficial for several reasons including surgical decision-making for  breast cancer, knowing how to follow individuals after completing their treatment, and understand if other family members could be at risk for cancer and allow them to undergo genetic testing.   We reviewed the characteristics, features and inheritance patterns of hereditary cancer syndromes. We also discussed genetic testing, including the appropriate family members to test, the process of testing, insurance coverage and turn-around-time for results. We discussed the implications of a negative, positive and/or variant of uncertain significant result. We recommended Lisa Cardenas pursue genetic testing for the [facility] STAT Breast Cancer Panel + Common Hereditary Cancers gene panel.   The STAT Breast  cancer panel offered by [facility] includes sequencing and rearrangement analysis for the following 9 genes:  ATM, BRCA1, BRCA2, CDH1, CHEK2, PALB2, PTEN, STK11 and TP53.    The Common Hereditary Cancers Panel offered by [facility] includes sequencing and/or deletion duplication testing of the following 48 genes: APC, ATM, AXIN2, BARD1, BMPR1A, BRCA1, BRCA2, BRIP1, CDH1, CDKN2A (p14ARF), CDKN2A (p16INK4a), CKD4, CHEK2, CTNNA1, DICER1, EPCAM (Deletion/duplication testing only), GREM1 (promoter region deletion/duplication testing only), KIT, MEN1, MLH1, MSH2, MSH3, MSH6, MUTYH, NBN, NF1, NHTL1, PALB2, PDGFRA, PMS2, POLD1, POLE, PTEN, RAD50, RAD51C, RAD51D, RNF43, SDHB, SDHC, SDHD, SMAD4, SMARCA4. STK11, TP53, TSC1, TSC2, and VHL.  The following genes were evaluated for sequence changes only: SDHA and HOXB13 c.251G>A variant only.  Based on Lisa Cardenas's personal and family history of cancer, she meets medical criteria for genetic testing. Despite that she meets criteria, she may still have an out of pocket cost.   PLAN: After considering the risks, benefits, and limitations, Lisa Cardenas provided informed consent to pursue genetic testing and the blood sample was sent to [facility] for analysis of the STAT Breast  Cancer Panel + Common Hereditary Cancers Panel. Results should be available within approximately 1weeks' time, at which point they will be disclosed by telephone to Lisa Cardenas, as will any additional recommendations warranted by these results. Lisa Cardenas will receive a summary of her genetic counseling visit and a copy of her results once available. This information will also be available in Epic.   Lisa Cardenas questions were answered to her satisfaction today. Our contact information was provided should additional questions or concerns arise. Thank you for the referral and allowing [place] to share in the care of your patient.   Faith Rogue, MS, [facility] Genetic Counselor [email or web address] Phone: [phone]  The patient was seen for a total of 25 minutes in face-to-face genetic counseling. Patient's husband was also present.  Drs. Magrinat, Lindi Adie and/or Burr Medico were available for discussion regarding this case.   _______________________________________________________________________ For Office Staff:  Number of people involved in session: 2 Was an Intern/ student involved with case: no

## 2019-10-30 ENCOUNTER — Encounter: Payer: Self-pay | Admitting: *Deleted

## 2019-10-30 ENCOUNTER — Telehealth: Payer: Self-pay | Admitting: Oncology

## 2019-10-30 NOTE — Telephone Encounter (Signed)
Scheduled appts per 4/15 los. Pt confirmed appt date and time.

## 2019-11-03 NOTE — Progress Notes (Signed)
Location of Breast Cancer: Malignant neoplasm of upper-outer quadrant of LEFT breast, estrogen receptor positive  Histology per Pathology Report:  10/06/2019 (Biopsy done at [facility] in [place], [place]) Invasive lobular carcinoma, E-cadherin negative, grade 1.   Receptor Status: ER(90%), PR (90%), Her2-neu (negative), Ki-67(5-7%)  Did patient present with symptoms (if so, please note symptoms) or was this found on screening mammography?:  Routine screening mammography on 08/10/2019 showing a possible abnormality in the left breast. She underwent left diagnostic mammography with tomography and left breast ultrasonography in [place], [place] on 09/11/2019 showing: scattered fibroglandular densities asymmetry in upper-outer left breast with no definitive sonographic correlate  Past/Anticipated interventions by surgeon, if any: TBD: She has previously met with Dr. Rolm Bookbinder who is setting her up for breast MRI 11/07/2019.  Past/Anticipated interventions by medical oncology, if any:  Under care of Dr. Sarajane Jews Magrinat 10/26/2019 (1) genetics testing 10/26/2019 (2) definitive surgery pending (3) chemotherapy not anticipated: Oncotype may be requested (4) adjuvant radiation (5) to start anastrozole at the completion of local treatment.  Lymphedema issues, if any:  None    Pain issues, if any:  None   SAFETY ISSUES:  Prior radiation? No  Pacemaker/ICD? No  Possible current pregnancy? No--hysterectomy in 1980  Is the patient on methotrexate? No  Current Complaints / other details:   Patient scheduled for breast MRI this morning as well. Otherwise, nothing of note.    Zola Button, RN 11/03/2019,3:47 PM

## 2019-11-06 ENCOUNTER — Encounter: Payer: Self-pay | Admitting: Rehabilitation

## 2019-11-06 ENCOUNTER — Telehealth: Payer: Self-pay | Admitting: Licensed Clinical Social Worker

## 2019-11-06 ENCOUNTER — Ambulatory Visit: Payer: Medicare Other | Attending: General Surgery | Admitting: Rehabilitation

## 2019-11-06 ENCOUNTER — Other Ambulatory Visit: Payer: Self-pay

## 2019-11-06 DIAGNOSIS — Z170 Estrogen receptor positive status [ER+]: Secondary | ICD-10-CM | POA: Diagnosis present

## 2019-11-06 DIAGNOSIS — C50912 Malignant neoplasm of unspecified site of left female breast: Secondary | ICD-10-CM | POA: Diagnosis present

## 2019-11-06 DIAGNOSIS — R293 Abnormal posture: Secondary | ICD-10-CM | POA: Diagnosis present

## 2019-11-06 NOTE — Therapy (Signed)
[place], [place], [address] Phone: [phone]   Fax:  [phone]  Physical Therapy Evaluation  Patient Details  Name: Lisa Cardenas MRN: [id number] Date of Birth: 10/01/1950 Referring Provider (PT): Dr. Donne Hazel    Encounter Date: 11/06/2019  PT End of Session - 11/06/19 1045    Visit Number  1    Number of Visits  2    Date for PT Re-Evaluation  12/11/19    PT Start Time  1006    PT Stop Time  1043    PT Time Calculation (min)  37 min    Activity Tolerance  Patient tolerated treatment well    Behavior During Therapy  [facility] for tasks assessed/performed       Past Medical History:  Diagnosis Date  . A-fib [facility])    no issues since May 2020  . Arrhythmia   . Diverticulosis   . Elevated cholesterol   . Family history of breast cancer   . Family history of melanoma   . Family history of pancreatic cancer   . Family history of prostate cancer   . Hypertension   . Obesity   . Pancreatitis   . Thyroid disease   . Trigeminal neuralgia   . UTI (urinary tract infection)     Past Surgical History:  Procedure Laterality Date  . ABDOMINAL HYSTERECTOMY  1980  [facility]  [facility]OS[facility]     [facility]  . KNEE ARTHROSCOPY Left   . TONSILLECTOMY      There were no vitals filed for this visit.   Subjective Assessment - 11/06/19 1011    Subjective  I move ok.  Just arthritis.    Pertinent History  Pt will be having Left lumpectomy with SLNB with Dr. Donne Hazel most likely no chemotherapy and will have radiation. 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   Other history: A-fib, high cholesterol ,HTN, trigeminal neuralgia, essential tremors    Patient Stated Goals  learn from all providers    Currently in Pain?  No/denies         [facility] PT Assessment - 11/06/19 0001      Assessment   Medical Diagnosis  Left breast cancer    Referring Provider (PT)  Dr.  Donne Hazel     Onset Date/Surgical Date  11/06/19    Hand Dominance  Right    Next MD Visit  tomorrow      Precautions   Precaution Comments  surgical/lymphedema after surgery      Restrictions   Weight Bearing Restrictions  No      Balance Screen   Has the patient fallen in the past 6 months  No    Has the patient had a decrease in activity level because of a fear of falling?   No    Is the patient reluctant to leave their home because of a fear of falling?   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    Leisure  walking; gardening       Cognition   Overall  Cognitive Status  Within Functional Limits for tasks assessed      Coordination   Gross Motor Movements are Fluid and Coordinated  Yes      Posture/Postural Control   Posture/Postural Control  Postural limitations    Postural Limitations  Rounded Shoulders;Forward head;Increased thoracic kyphosis      ROM / Strength   AROM / PROM / Strength  AROM      AROM   AROM Assessment Site  Shoulder    Right/Left Shoulder  Right;Left    Right Shoulder Extension  65 Degrees    Right Shoulder Flexion  170 Degrees    Right Shoulder ABduction  165 Degrees    Right Shoulder Internal Rotation  70 Degrees    Right Shoulder External Rotation  85 Degrees    Left Shoulder Extension  70 Degrees    Left Shoulder Flexion  164 Degrees    Left Shoulder ABduction  160 Degrees    Left Shoulder Internal Rotation  70 Degrees    Left Shoulder External Rotation  85 Degrees        LYMPHEDEMA/ONCOLOGY QUESTIONNAIRE - 11/06/19 1026      Type   Cancer Type  left breast cancer      Lymphedema Assessments   Lymphedema Assessments  Upper extremities       Right Upper Extremity Lymphedema   15 cm Proximal to Olecranon Process  39.4 cm    10 cm Proximal to Olecranon Process  35.3 cm    Olecranon Process  30 cm    10 cm Proximal to Ulnar Styloid Process  22.8 cm    Just Proximal to Ulnar Styloid Process  16.6 cm    Across Hand at [employer]  20.1 cm    At [place] of 2nd Digit  6.7 cm      Left Upper Extremity Lymphedema   15 cm Proximal to Olecranon Process  41.1 cm    10 cm Proximal to Olecranon Process  37.4 cm    Olecranon Process  29.5 cm    10 cm Proximal to Ulnar Styloid Process  23.4 cm    Just Proximal to Ulnar Styloid Process  16.6 cm    Across Hand at [employer]  20 cm    At [place] of 2nd Digit  6.4 cm             Objective measurements completed on examination: See above findings.              PT Education - 11/06/19 1044    Education Details  lymphedema briefly, POC, post op stretches, ABC class    Person(s) Educated  Patient    Methods  Explanation;Demonstration;Tactile cues;Verbal cues;Handout    Comprehension  Verbalized understanding           Breast Clinic Goals - 11/06/19 1050      Patient will be able to verbalize understanding of pertinent lymphedema risk reduction practices relevant to her diagnosis specifically related to skin care.   Status  Achieved      Patient will be able to return demonstrate and/or verbalize understanding of the post-op home exercise program related to regaining shoulder range of motion.   Status  Achieved      Patient will be able to verbalize understanding of the importance of attending the postoperative After Breast Cancer Class for further lymphedema risk reduction education and therapeutic exercise.   Status  Achieved  Plan - 11/06/19 1045    Clinical Impression Statement  Pt presents pre-operatively for her left sided invasive lobular carcinoma ER/PR positive, HER2negative for post op exercise instruction and lymphedema assessment.   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  Pt was unable to do the SOZO screen due to A-fib.    Personal Factors and Comorbidities  Age;Fitness;Comorbidity 2    Comorbidities  will be having radiation and SLNB    Stability/Clinical Decision Making  Stable/Uncomplicated    Clinical Decision Making  Low    Rehab Potential  Excellent    PT Frequency  Other (comment)   post op visit   PT Duration  8 weeks    PT Treatment/Interventions  ADLs/Self Care Home Management;Patient/family education;Therapeutic exercise;Manual techniques;Manual lymph drainage;Passive range of motion;Scar mobilization    PT Next Visit Plan  post op screen with visits as needed; going to do ABC class or need in person instruction? give info on getting compression if needed    PT Home Exercise Plan  post op    Consulted and Agree with Plan of Care  Patient       Patient will benefit from skilled therapeutic intervention in order to improve the following deficits and impairments:  Postural dysfunction  Visit Diagnosis: Abnormal posture  Malignant neoplasm of left breast in female, estrogen receptor positive, unspecified site of breast [facility])     Problem List Patient Active Problem List   Diagnosis Date Noted  . Malignant neoplasm of upper-outer quadrant of left breast in female, estrogen receptor positive ([place]) 10/26/2019  . Atrial fibrillation ([place]) 10/26/2019  . Chronic anticoagulation 10/26/2019  . Morbid obesity with BMI of 40.0-44.9, adult ([place]) 10/26/2019  . Family history of breast cancer   . Family history of pancreatic cancer   . Family history of prostate cancer   . Family history of melanoma     Stark Bray 11/06/2019, 10:50 AM  [place], [place], [address] Phone: [phone]   Fax:  [phone]  Name: Lisa Cardenas MRN: [id number] Date of Birth: 10-31-1950

## 2019-11-06 NOTE — Patient Instructions (Signed)
Physical Therapy Information for After Breast Cancer Surgery/Treatment:   Lymphedema is a swelling condition that you may be at risk for in your arm if you have lymph nodes removed from the armpit area.  After a sentinel node biopsy, the risk is approximately 5-9% and is higher after an axillary node dissection.  There is treatment available for this condition and it is not life-threatening.  Contact your physician or physical therapist with concerns.  You may begin the 4 shoulder/posture exercises (see additional sheet) when permitted by your physician (typically a week after surgery).  If you have drains, you may need to wait until those are removed before beginning range of motion exercises.  A general recommendation is to not lift your arms above shoulder height until drains are removed.  These exercises should be done to your tolerance and gently.  This is not a "no pain/no gain" type of recovery so listen to your body and stretch into the range of motion that you can tolerate, stopping if you have pain.  If you are having immediate reconstruction, ask your plastic surgeon about doing exercises as he or she may want you to wait.  We encourage you to attend the free one time ABC (After Breast Cancer) class offered by [facility].  You will learn information related to lymphedema risk, prevention and treatment and additional exercises to regain mobility following surgery.  You can call [phone] for more information.  This is offered the 1st and 3rd Monday of each month.  You only attend the class one time.  While undergoing any medical procedure or treatment, try to avoid blood pressure being taken or needle sticks from occurring on the arm on the side of cancer.   This recommendation begins after surgery and continues for the rest of your life.  This may help reduce your risk of getting lymphedema (swelling in your arm).  An excellent resource for those seeking information  on lymphedema is the National Lymphedema Network's web site. It can be accessed at [facility].org  If you notice swelling in your hand, arm or breast at any time following surgery (even if it is many years from now), please contact your doctor or physical therapist to discuss this.  Lymphedema can be treated at any time but it is easier for you if it is treated early on.  If you feel like your shoulder motion is not returning to normal in a reasonable amount of time, please contact your surgeon or physical therapist.   ABC CLASS After Breast Cancer Class  After Breast Cancer Class is a specially designed exercise class to assist you in a safe recover after having breast cancer surgery.  In this class you will learn how to get back to full function whether your drains were just removed or if you had surgery a month ago.  This one-time class is held the 1st and 3rd Monday of every month from 11:00 a.m. until 12:00 noon at the [facility] located at [place], [place] [address]  This class is FREE and space is limited. For more information or to register for the next available class, call [phone].  Class Goals   Understand specific stretches to improve the flexibility of you chest and shoulder.  Learn ways to safely strengthen your upper body and improve your posture.  Understand the warning signs of infection and why you may be at risk for an arm infection.  Learn about Lymphedema  and prevention.  ** You do not attend this class until after surgery.  Drains must be removed to participate  Patient was instructed today in a home exercise program today for post op shoulder range of motion. These included active assist shoulder flexion in sitting, scapular retraction, wall walking with shoulder abduction, and hands behind head external rotation.  She was encouraged to do these twice a day, holding 3 seconds and repeating 5 times when permitted  by her physician.

## 2019-11-06 NOTE — Telephone Encounter (Signed)
Revealed negative genetic testing.   We discussed that we do not know why she has breast cancer or why there is cancer in the family. It could be due to a different gene that we are not testing, or something our current technology cannot pick up.  It will be important for her to keep in contact with genetics to learn if additional testing may be needed in the future.  

## 2019-11-07 ENCOUNTER — Encounter: Payer: Self-pay | Admitting: Radiation Oncology

## 2019-11-07 ENCOUNTER — Other Ambulatory Visit: Payer: Self-pay | Admitting: General Surgery

## 2019-11-07 ENCOUNTER — Other Ambulatory Visit: Payer: Self-pay

## 2019-11-07 ENCOUNTER — Encounter: Payer: Self-pay | Admitting: Licensed Clinical Social Worker

## 2019-11-07 ENCOUNTER — Ambulatory Visit
Admission: RE | Admit: 2019-11-07 | Discharge: 2019-11-07 | Disposition: A | Discharge status: Home / Self Care | Payer: Medicare Other | Source: Ambulatory Visit | Attending: General Surgery | Admitting: General Surgery

## 2019-11-07 ENCOUNTER — Ambulatory Visit
Admission: RE | Admit: 2019-11-07 | Discharge: 2019-11-07 | Disposition: A | Discharge status: Home / Self Care | Payer: Medicare Other | Source: Ambulatory Visit | Attending: Radiation Oncology | Admitting: Radiation Oncology

## 2019-11-07 ENCOUNTER — Ambulatory Visit: Payer: Self-pay | Admitting: Licensed Clinical Social Worker

## 2019-11-07 DIAGNOSIS — Z170 Estrogen receptor positive status [ER+]: Secondary | ICD-10-CM

## 2019-11-07 DIAGNOSIS — C50412 Malignant neoplasm of upper-outer quadrant of left female breast: Secondary | ICD-10-CM

## 2019-11-07 DIAGNOSIS — Z808 Family history of malignant neoplasm of other organs or systems: Secondary | ICD-10-CM

## 2019-11-07 DIAGNOSIS — Z1379 Encounter for other screening for genetic and chromosomal anomalies: Secondary | ICD-10-CM

## 2019-11-07 DIAGNOSIS — Z1331 Encounter for screening for depression: Secondary | ICD-10-CM | POA: Insufficient documentation

## 2019-11-07 DIAGNOSIS — Z800 Family history of malignant neoplasm of digestive organs: Secondary | ICD-10-CM

## 2019-11-07 DIAGNOSIS — Z8042 Family history of malignant neoplasm of prostate: Secondary | ICD-10-CM

## 2019-11-07 DIAGNOSIS — Z803 Family history of malignant neoplasm of breast: Secondary | ICD-10-CM

## 2019-11-07 MED ORDER — GADOBUTROL 1 MMOL/ML IV SOLN
10.0000 mL | Freq: Once | INTRAVENOUS | Status: AC | PRN
  Administered 2019-11-07: 11:00:00 10 mL via INTRAVENOUS

## 2019-11-07 NOTE — Progress Notes (Signed)
HPI:  Ms. Sassi was previously seen in the [facility] clinic due to a personal and family history of cancer and concerns regarding a hereditary predisposition to cancer. Please refer to our prior cancer genetics clinic note for more information regarding our discussion, assessment and recommendations, at the time. Ms. Byrer recent genetic test results were disclosed to her, as were recommendations warranted by these results. These results and recommendations are discussed in more detail below.  CANCER HISTORY:  Oncology History  Malignant neoplasm of upper-outer quadrant of left breast in female, estrogen receptor positive ([place])  10/26/2019 Initial Diagnosis   Malignant neoplasm of upper-outer quadrant of left breast in female, estrogen receptor positive ([place])   11/01/2019 Cancer Staging   Staging form: Breast, AJCC 8th Edition - Clinical: Stage IA (cT1a, cN0, cM0, G2, ER+, PR+, HER2-) - Signed by Gardenia Phlegm, NP on 11/01/2019   11/03/2019 Genetic Testing   Negative genetic testing. No pathogenic variants identified on the [facility] Breast Cancer STAT Panel + Common Hereditary Cancers Panel. The report date is 11/03/2019.  The STAT Breast cancer panel offered by [facility] includes sequencing and rearrangement analysis for the following 9 genes:  ATM, BRCA1, BRCA2, CDH1, CHEK2, PALB2, PTEN, STK11 and TP53.    The Common Hereditary Cancers Panel offered by [facility] includes sequencing and/or deletion duplication testing of the following 48 genes: APC, ATM, AXIN2, BARD1, BMPR1A, BRCA1, BRCA2, BRIP1, CDH1, CDKN2A (p14ARF), CDKN2A (p16INK4a), CKD4, CHEK2, CTNNA1, DICER1, EPCAM (Deletion/duplication testing only), GREM1 (promoter region deletion/duplication testing only), KIT, MEN1, MLH1, MSH2, MSH3, MSH6, MUTYH, NBN, NF1, NHTL1, PALB2, PDGFRA, PMS2, POLD1, POLE, PTEN, RAD50, RAD51C, RAD51D, RNF43, SDHB, SDHC, SDHD, SMAD4, SMARCA4. STK11, TP53, TSC1, TSC2, and VHL.  The following  genes were evaluated for sequence changes only: SDHA and HOXB13 c.251G>A variant only.     FAMILY HISTORY:  We obtained a detailed, 4-generation family history.  Significant diagnoses are listed below: Family History  Problem Relation Age of Onset  . Colon cancer Mother        dx 105  . Breast cancer Maternal Grandmother   . Breast cancer Maternal Aunt   . Prostate cancer Maternal Uncle   . Breast cancer Paternal Aunt   . Melanoma Paternal Uncle   . Breast cancer Maternal Aunt   . Leukemia Maternal Uncle   . Breast cancer Paternal Aunt   . Colon cancer Paternal Aunt   . Melanoma Daughter    Ms. Lovejoy has 3 daughters, ages 38, 44 and 81. Her oldest daughter has had a few melanomas removed in areas that had sun exposure. Patient does not have siblings.  Ms. Thang mother was diagnosed with colon cancer at 53 and died recently at 31. Patient had 4 maternal aunts and 2 maternal uncles. One aunt had breast cancer in her 6s and lung cancer. Another aunt had breast cancer in her 49s. An uncle had prostate cancer in his 70s and another had leukemia in his 24s. A maternal cousin had breast cancer in her 12s, pancreatic cancer at 57 and died at 88. Maternal grandmother had breast cancer in her 55s, grandfather passed in his 71s as well.   Ms. Hackman's father died at 34, no cancers. Patient had 4 paternal aunts and 1 paternal uncle. One of her aunts had breast cancer and colon cancer diagnosed in her 47s, and another aunt had breast in her 70s. An uncle had melanoma at 24 and died at 2. Paternal grandfather died "young" and grandmother died  in her 56s.   Ms. Borgerding is unaware of previous family history of genetic testing for hereditary cancer risks. Patient's maternal ancestors are of [place] descent, and paternal ancestors are of [place] descent. There is no reported Ashkenazi Jewish ancestry. There is no known consanguinity.  GENETIC TEST RESULTS: Genetic testing reported out on 11/03/2019  through the [facility] Breast Cancer STAT Panel + Common Hereditary cancer panel found no pathogenic mutations.   The STAT Breast cancer panel offered by [facility] includes sequencing and rearrangement analysis for the following 9 genes:  ATM, BRCA1, BRCA2, CDH1, CHEK2, PALB2, PTEN, STK11 and TP53.    The Common Hereditary Cancers Panel offered by [facility] includes sequencing and/or deletion duplication testing of the following 48 genes: APC, ATM, AXIN2, BARD1, BMPR1A, BRCA1, BRCA2, BRIP1, CDH1, CDKN2A (p14ARF), CDKN2A (p16INK4a), CKD4, CHEK2, CTNNA1, DICER1, EPCAM (Deletion/duplication testing only), GREM1 (promoter region deletion/duplication testing only), KIT, MEN1, MLH1, MSH2, MSH3, MSH6, MUTYH, NBN, NF1, NHTL1, PALB2, PDGFRA, PMS2, POLD1, POLE, PTEN, RAD50, RAD51C, RAD51D, RNF43, SDHB, SDHC, SDHD, SMAD4, SMARCA4. STK11, TP53, TSC1, TSC2, and VHL.  The following genes were evaluated for sequence changes only: SDHA and HOXB13 c.251G>A variant only.  The test report has been scanned into EPIC and is located under the Molecular Pathology section of the Results Review tab.  A portion of the result report is included below for reference.     We discussed with Ms. Riecke that because current genetic testing is not perfect, it is possible there may be a gene mutation in one of these genes that current testing cannot detect, but that chance is small.  We also discussed, that there could be another gene that has not yet been discovered, or that we have not yet tested, that is responsible for the cancer diagnoses in the family. It is also possible there is a hereditary cause for the cancer in the family that Ms. Zavada did not inherit and therefore was not identified in her testing.  Therefore, it is important to remain in touch with cancer genetics in the future so that we can continue to offer Ms. Bonawitz the most up to date genetic testing.   ADDITIONAL GENETIC TESTING: We discussed with Ms. Silbernagel that her  genetic testing was fairly extensive.  If there are genes identified to increase cancer risk that can be analyzed in the future, we would be happy to discuss and coordinate this testing at that time.    CANCER SCREENING RECOMMENDATIONS: Ms. Eppinger test result is considered negative (normal).  This means that we have not identified a hereditary cause for her  personal and family history of cancer at this time. Most cancers happen by chance and this negative test suggests that her cancer may fall into this category.    While reassuring, this does not definitively rule out a hereditary predisposition to cancer. It is still possible that there could be genetic mutations that are undetectable by current technology. There could be genetic mutations in genes that have not been tested or identified to increase cancer risk.  Therefore, it is recommended she continue to follow the cancer management and screening guidelines provided by her oncology and primary healthcare provider.   An individual's cancer risk and medical management are not determined by genetic test results alone. Overall cancer risk assessment incorporates additional factors, including personal medical history, family history, and any available genetic information that may result in a personalized plan for cancer prevention and surveillance.    RECOMMENDATIONS FOR FAMILY MEMBERS:  Relatives  in this family might be at some increased risk of developing cancer, over the general population risk, simply due to the family history of cancer.  We recommended female relatives in this family have a yearly mammogram beginning at age 38, or 52 years younger than the earliest onset of cancer, an annual clinical breast exam, and perform monthly breast self-exams. Female relatives in this family should also have a gynecological exam as recommended by their primary provider. All family members should have a colonoscopy by age 61, or as directed by their  physicians.   It is also possible there is a hereditary cause for the cancer in Ms. Hodgman's family that she did not inherit and therefore was not identified in her.  Based on Ms. Critz's family history, we recommended her maternal relatives, especially those related to her cousin who had breast and pancreatic cancer, have genetic counseling and testing. Ms. Suh will let us know if we can be of any assistance in coordinating genetic counseling and/or testing for these family members.  FOLLOW-UP: Lastly, we discussed with Ms. Lege that cancer genetics is a rapidly advancing field and it is possible that new genetic tests will be appropriate for her and/or her family members in the future. We encouraged her to remain in contact with cancer genetics on an annual basis so we can update her personal and family histories and let her know of advances in cancer genetics that may benefit this family.   Our contact number was provided. Ms. Rau questions were answered to her satisfaction, and she knows she is welcome to call us at anytime with additional questions or concerns.   Faith Rogue, MS, [facility] Genetic Counselor [email or web address] Phone: [phone]

## 2019-11-07 NOTE — Progress Notes (Signed)
Radiation Oncology         [phone] ________________________________  Initial outpatient Consultation by telephone.  The patient opted for telemedicine to maximize safety during the pandemic.  MyChart video was not obtainable.   Name: Lisa Cardenas MRN: [id number]  Date: 11/07/2019  DOB: August 23, 1950  HQ:[id number], Legrand Como, MD  Magrinat, Virgie Dad, MD   REFERRING PHYSICIAN: Magrinat, Virgie Dad, MD  DIAGNOSIS:    ICD-10-CM   1. Malignant neoplasm of upper-outer quadrant of left breast in female, estrogen receptor positive ([place])  C50.412    Z17.0   Cancer Staging Malignant neoplasm of upper-outer quadrant of left breast in female, estrogen receptor positive ([place]) Staging form: Breast, AJCC 8th Edition - Clinical: Stage IA (cT1a, cN0, cM0, G2, ER+, PR+, HER2-) - Signed by Gardenia Phlegm, NP on 11/01/2019  ( Note -staging is incomplete, pending MRI of breasts)  CHIEF COMPLAINT: Here to discuss management of left breast cancer  HISTORY OF PRESENT ILLNESS::Lisa Cardenas is a 69 y.o. female who presented with breast abnormality on the following imaging: screening mammogram on the date of 08/10/2019.  Symptoms, if any, at that time, were: none.   Ultrasound of breast on 09/11/2019 revealed scattered fibroglandular densities in upper-outer left breast without sonographic correlate.   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  ER status: >90%; PR status >90%, Her2 status negative; Grade 1.  She met with Dr. Jana Hakim on 10/26/2019, who recommended breast MRI, definitive surgery, possible oncotype testing, adjuvant radiation therapy, and finally anastrozole. Of note, she also underwent genetic testing the same day, and this returned negative results.  She is scheduled for breast MRI later today.  She is a retired Haematologist.  She reports a history of atrial fibrillation and is on Xarelto and metoprolol.  She denies having a pacemaker.   PREVIOUS RADIATION THERAPY:  No  PAST MEDICAL HISTORY:  has a past medical history of A-fib ([place]), Arrhythmia, Diverticulosis, Elevated cholesterol, Family history of breast cancer, Family history of melanoma, Family history of pancreatic cancer, Family history of prostate cancer, Hypertension, Obesity, Pancreatitis, Thyroid disease, Trigeminal neuralgia, and UTI (urinary tract infection).    PAST SURGICAL HISTORY: Past Surgical History:  Procedure Laterality Date  . ABDOMINAL HYSTERECTOMY  1980  [facility]  [facility]OPH[facility]ODU[facility]ENOSCOPY     [facility]  . KNEE ARTHROSCOPY Left   . TONSILLECTOMY      FAMILY HISTORY: family history includes Breast cancer in her maternal aunt, maternal aunt, maternal grandmother, paternal aunt, and paternal aunt; Colon cancer in her mother and paternal aunt; Leukemia in her maternal uncle; Melanoma in her daughter and paternal uncle; Prostate cancer in her maternal uncle.  SOCIAL HISTORY:  reports that she has never smoked. She has never used smokeless tobacco. She reports previous alcohol use. She reports previous drug use.  ALLERGIES: Plaquenil [hydroxychloroquine]  MEDICATIONS:  Current Outpatient Medications  Medication Sig Dispense Refill  . carbamazepine (TEGRETOL) 200 MG tablet Take 200 mg by mouth daily with breakfast. 1/2 tablet daily    . celecoxib (CELEBREX) 200 MG capsule Take 200 mg by mouth every 30 (thirty) days.    Marland Kitchen esomeprazole (NEXIUM) 40 MG capsule Take 40 mg by mouth 2 (two) times daily as needed.    . gabapentin (NEURONTIN) 300 MG capsule Take 300 mg by mouth daily.    Marland Kitchen levothyroxine (SYNTHROID) 137 MCG tablet Take 137 mcg by mouth daily. Current dose 137 mcg daily    .  LORazepam (ATIVAN) 0.5 MG tablet Take 0.5 mg by mouth as needed for anxiety.    . metoprolol succinate (TOPROL-XL) 100 MG 24 hr tablet Take 100 mg by mouth 2 (two) times daily.    Marland Kitchen olmesartan-hydrochlorothiazide (BENICAR HCT) 20-12.5 MG tablet Take 1 tablet by mouth daily.    . primidone  (MYSOLINE) 50 MG tablet Take 50 mg by mouth daily.     . rivaroxaban (XARELTO) 20 MG TABS tablet Take 20 mg by mouth daily.    . sertraline (ZOLOFT) 50 MG tablet Take 50 mg by mouth daily.    . simvastatin (ZOCOR) 40 MG tablet Take 40 mg by mouth daily.    . Vitamin D, Ergocalciferol, (DRISDOL) 1.25 MG (50000 UNIT) CAPS capsule Take 50,000 Units by mouth once a week.     No current facility-administered medications for this encounter.    REVIEW OF SYSTEMS: As above  PHYSICAL EXAM:  vitals were not taken for this visit.   General: Alert and oriented, in no acute distress   LABORATORY DATA:  Lab Results  Component Value Date   WBC 6.7 10/26/2019   HGB 14.2 10/26/2019   HCT 44.0 10/26/2019   MCV 87.0 10/26/2019   PLT 219 10/26/2019   CMP     Component Value Date/Time   NA 138 10/26/2019 1507   K 3.6 10/26/2019 1507   CL 100 10/26/2019 1507   CO2 30 10/26/2019 1507   GLUCOSE 123 (H) 10/26/2019 1507   BUN 12 10/26/2019 1507   CREATININE 0.78 10/26/2019 1507   CALCIUM 10.0 10/26/2019 1507   PROT 7.4 10/26/2019 1507   ALBUMIN 4.1 10/26/2019 1507   AST 11 (L) 10/26/2019 1507   ALT 15 10/26/2019 1507   ALKPHOS 79 10/26/2019 1507   BILITOT 0.4 10/26/2019 1507   GFRNONAA >60 10/26/2019 1507   GFRAA >60 10/26/2019 1507        RADIOGRAPHY:    As above    IMPRESSION/PLAN: Left Breast Cancer   This is a delightful 69 year old woman from [place] with left breast cancer.  She would like to receive all of her treatment in [place]  It was a pleasure meeting the patient today. We discussed the risks, benefits, and side effects of radiotherapy.  We discussed that in the setting of lumpectomy for stage I breast cancer, generally patients benefit from a 4-week course of radiotherapy to the breast.  We discussed that if she has positive nodes or needs a mastectomy she would more likely require a 6-week course of radiotherapy.  I recommend adjuvant radiotherapy to reduce her  risk of locoregional recurrence by 2/3.  We discussed that I would give the patient a few weeks to heal following surgery before starting treatment planning.  If chemotherapy were to be given, this would precede radiotherapy. We spoke about acute effects including skin irritation and fatigue as well as much less common late effects including internal organ injury or irritation. We spoke about the latest technology (including heart sparing techniques) that is used to minimize the risk of late effects for patients undergoing radiotherapy to the breast or chest wall. No guarantees of treatment were given. The patient is enthusiastic about proceeding with treatment. I look forward to participating in the patient's care.  I will await her referral back to me for postoperative follow-up and eventual CT simulation/treatment planning.  Of note, she has received the [place] for COVID-19.  This encounter was provided by telemedicine platform by telephone.  The  patient opted for telemedicine to maximize safety during the pandemic.  MyChart video was not obtainable. The patient has given verbal consent for this type of encounter and has been advised to only accept a meeting of this type in a secure network environment. On date of service, in total, I spent 35 minutes on this encounter. The attendants for this meeting include Eppie Gibson  and Kennyth Arnold.  During the encounter, Eppie Gibson was located at [facility] Radiation Oncology Department.  Lisa Cardenas was located at home.     __________________________________________   Eppie Gibson, MD   This document serves as a record of services personally performed by Eppie Gibson, MD. It was created on her behalf by Wilburn Mylar, a trained medical scribe. The creation of this record is based on the scribe's personal observations and the provider's statements to them. This document has been checked and approved by the  attending provider.

## 2019-11-08 ENCOUNTER — Encounter: Payer: Self-pay | Admitting: *Deleted

## 2019-11-10 ENCOUNTER — Ambulatory Visit
Admission: RE | Admit: 2019-11-10 | Discharge: 2019-11-10 | Disposition: A | Discharge status: Home / Self Care | Payer: Medicare Other | Source: Ambulatory Visit | Attending: General Surgery | Admitting: General Surgery

## 2019-11-10 ENCOUNTER — Other Ambulatory Visit: Payer: Medicare Other

## 2019-11-10 ENCOUNTER — Other Ambulatory Visit: Payer: Self-pay

## 2019-11-10 DIAGNOSIS — Z170 Estrogen receptor positive status [ER+]: Secondary | ICD-10-CM

## 2019-11-10 DIAGNOSIS — C50412 Malignant neoplasm of upper-outer quadrant of left female breast: Secondary | ICD-10-CM

## 2019-11-11 HISTORY — PX: BREAST BIOPSY: SHX20

## 2019-11-13 ENCOUNTER — Other Ambulatory Visit: Payer: Self-pay | Admitting: General Surgery

## 2019-11-13 DIAGNOSIS — Z170 Estrogen receptor positive status [ER+]: Secondary | ICD-10-CM

## 2019-11-13 DIAGNOSIS — C50412 Malignant neoplasm of upper-outer quadrant of left female breast: Secondary | ICD-10-CM

## 2019-11-16 ENCOUNTER — Encounter: Payer: Self-pay | Admitting: *Deleted

## 2019-11-16 ENCOUNTER — Ambulatory Visit: Payer: Medicare Other | Admitting: Physical Therapy

## 2019-11-17 ENCOUNTER — Encounter (HOSPITAL_BASED_OUTPATIENT_CLINIC_OR_DEPARTMENT_OTHER): Payer: Self-pay | Admitting: General Surgery

## 2019-11-17 ENCOUNTER — Other Ambulatory Visit: Payer: Self-pay

## 2019-11-20 ENCOUNTER — Encounter (HOSPITAL_BASED_OUTPATIENT_CLINIC_OR_DEPARTMENT_OTHER)
Admission: RE | Admit: 2019-11-20 | Discharge: 2019-11-20 | Disposition: A | Discharge status: Home / Self Care | Payer: Medicare Other | Source: Ambulatory Visit | Attending: General Surgery | Admitting: General Surgery

## 2019-11-20 ENCOUNTER — Other Ambulatory Visit: Payer: Self-pay

## 2019-11-20 ENCOUNTER — Other Ambulatory Visit (HOSPITAL_COMMUNITY)
Admission: RE | Admit: 2019-11-20 | Discharge: 2019-11-20 | Disposition: A | Discharge status: Home / Self Care | Payer: Medicare Other | Source: Ambulatory Visit | Attending: General Surgery | Admitting: General Surgery

## 2019-11-20 DIAGNOSIS — I10 Essential (primary) hypertension: Secondary | ICD-10-CM | POA: Diagnosis not present

## 2019-11-20 DIAGNOSIS — Z20822 Contact with and (suspected) exposure to covid-19: Secondary | ICD-10-CM | POA: Diagnosis not present

## 2019-11-20 DIAGNOSIS — Z01818 Encounter for other preprocedural examination: Secondary | ICD-10-CM | POA: Diagnosis not present

## 2019-11-20 DIAGNOSIS — R001 Bradycardia, unspecified: Secondary | ICD-10-CM | POA: Diagnosis not present

## 2019-11-20 LAB — BASIC METABOLIC PANEL
Anion gap: 10 (ref 5–15)
BUN: 15 mg/dL (ref 8–23)
CO2: 27 mmol/L (ref 22–32)
Calcium: 9.8 mg/dL (ref 8.9–10.3)
Chloride: 101 mmol/L (ref 98–111)
Creatinine, Ser: 0.76 mg/dL (ref 0.44–1.00)
GFR calc Af Amer: >60 mL/min (ref >60)
GFR calc non Af Amer: >60 mL/min (ref >60)
Glucose, Bld: 100 mg/dL — ABNORMAL HIGH (ref 70–99)
Potassium: 4 mmol/L (ref 3.5–5.1)
Sodium: 138 mmol/L (ref 135–145)

## 2019-11-20 LAB — SARS CORONAVIRUS 2 (TAT 6-24 HRS): SARS Coronavirus 2: NEGATIVE

## 2019-11-20 MED ORDER — ENSURE PRE-SURGERY PO LIQD: 296.0000 mL | Freq: Once | ORAL | Status: DC

## 2019-11-20 NOTE — Progress Notes (Signed)

## 2019-11-23 ENCOUNTER — Ambulatory Visit
Admission: RE | Admit: 2019-11-23 | Discharge: 2019-11-23 | Disposition: A | Discharge status: Home / Self Care | Payer: Medicare Other | Source: Ambulatory Visit | Attending: General Surgery | Admitting: General Surgery

## 2019-11-23 ENCOUNTER — Ambulatory Visit (HOSPITAL_BASED_OUTPATIENT_CLINIC_OR_DEPARTMENT_OTHER): Payer: Medicare Other | Admitting: Anesthesiology

## 2019-11-23 ENCOUNTER — Ambulatory Visit (HOSPITAL_BASED_OUTPATIENT_CLINIC_OR_DEPARTMENT_OTHER)
Admission: RE | Admit: 2019-11-23 | Discharge: 2019-11-23 | Disposition: A | Discharge status: Home / Self Care | Payer: Medicare Other | Attending: General Surgery | Admitting: General Surgery

## 2019-11-23 ENCOUNTER — Encounter (HOSPITAL_BASED_OUTPATIENT_CLINIC_OR_DEPARTMENT_OTHER): Payer: Self-pay | Admitting: General Surgery

## 2019-11-23 ENCOUNTER — Other Ambulatory Visit: Payer: Self-pay | Admitting: General Surgery

## 2019-11-23 ENCOUNTER — Encounter (HOSPITAL_BASED_OUTPATIENT_CLINIC_OR_DEPARTMENT_OTHER)
Admission: RE | Disposition: A | Discharge status: Home / Self Care | Payer: Self-pay | Source: Home / Self Care | Attending: General Surgery

## 2019-11-23 ENCOUNTER — Ambulatory Visit (HOSPITAL_COMMUNITY)
Admission: RE | Admit: 2019-11-23 | Discharge: 2019-11-23 | Disposition: A | Discharge status: Home / Self Care | Payer: Medicare Other | Source: Ambulatory Visit | Attending: General Surgery | Admitting: General Surgery

## 2019-11-23 ENCOUNTER — Other Ambulatory Visit: Payer: Self-pay

## 2019-11-23 DIAGNOSIS — C50412 Malignant neoplasm of upper-outer quadrant of left female breast: Secondary | ICD-10-CM | POA: Diagnosis present

## 2019-11-23 DIAGNOSIS — Z8619 Personal history of other infectious and parasitic diseases: Secondary | ICD-10-CM | POA: Insufficient documentation

## 2019-11-23 DIAGNOSIS — E039 Hypothyroidism, unspecified: Secondary | ICD-10-CM | POA: Diagnosis not present

## 2019-11-23 DIAGNOSIS — Z6841 Body Mass Index (BMI) 40.0 and over, adult: Secondary | ICD-10-CM | POA: Insufficient documentation

## 2019-11-23 DIAGNOSIS — Z170 Estrogen receptor positive status [ER+]: Secondary | ICD-10-CM | POA: Insufficient documentation

## 2019-11-23 DIAGNOSIS — I4891 Unspecified atrial fibrillation: Secondary | ICD-10-CM | POA: Diagnosis not present

## 2019-11-23 DIAGNOSIS — Z803 Family history of malignant neoplasm of breast: Secondary | ICD-10-CM | POA: Diagnosis not present

## 2019-11-23 DIAGNOSIS — Z79899 Other long term (current) drug therapy: Secondary | ICD-10-CM | POA: Diagnosis not present

## 2019-11-23 DIAGNOSIS — I10 Essential (primary) hypertension: Secondary | ICD-10-CM | POA: Diagnosis not present

## 2019-11-23 DIAGNOSIS — Z79890 Hormone replacement therapy (postmenopausal): Secondary | ICD-10-CM | POA: Diagnosis not present

## 2019-11-23 DIAGNOSIS — Z7901 Long term (current) use of anticoagulants: Secondary | ICD-10-CM | POA: Diagnosis not present

## 2019-11-23 HISTORY — PX: BREAST LUMPECTOMY: SHX2

## 2019-11-23 HISTORY — PX: BREAST LUMPECTOMY WITH RADIOACTIVE SEED AND SENTINEL LYMPH NODE BIOPSY: SHX6550

## 2019-11-23 SURGERY — BREAST LUMPECTOMY WITH RADIOACTIVE SEED AND SENTINEL LYMPH NODE BIOPSY
Anesthesia: Regional | Site: Breast | Laterality: Left

## 2019-11-23 MED ORDER — FENTANYL CITRATE (PF) 100 MCG/2ML IJ SOLN
INTRAMUSCULAR | Status: DC | PRN
  Administered 2019-11-23: 25 ug via INTRAVENOUS
  Administered 2019-11-23: 50 ug via INTRAVENOUS
  Administered 2019-11-23: 25 ug via INTRAVENOUS

## 2019-11-23 MED ORDER — MIDAZOLAM HCL 2 MG/2ML IJ SOLN
INTRAMUSCULAR | Status: AC
  Filled 2019-11-23: qty 2

## 2019-11-23 MED ORDER — TECHNETIUM TC 99M SULFUR COLLOID FILTERED
1.0000 | Freq: Once | INTRAVENOUS | Status: AC | PRN
  Administered 2019-11-23: 1 via INTRADERMAL

## 2019-11-23 MED ORDER — ROPIVACAINE HCL 5 MG/ML IJ SOLN
INTRAMUSCULAR | Status: DC | PRN
Start: 2019-11-23 — End: 2019-11-23
  Administered 2019-11-23: 30 mL via PERINEURAL

## 2019-11-23 MED ORDER — ONDANSETRON HCL 4 MG/2ML IJ SOLN
INTRAMUSCULAR | Status: DC | PRN
  Administered 2019-11-23: 4 mg via INTRAVENOUS

## 2019-11-23 MED ORDER — DEXAMETHASONE SODIUM PHOSPHATE 10 MG/ML IJ SOLN
INTRAMUSCULAR | Status: DC | PRN
  Administered 2019-11-23: 5 mg

## 2019-11-23 MED ORDER — FENTANYL CITRATE (PF) 100 MCG/2ML IJ SOLN
INTRAMUSCULAR | Status: AC
  Filled 2019-11-23: qty 2

## 2019-11-23 MED ORDER — TRAMADOL HCL 50 MG PO TABS: 50.0000 mg | ORAL_TABLET | Freq: Four times a day (QID) | ORAL | 0 refills | Status: DC | PRN

## 2019-11-23 MED ORDER — PROPOFOL 10 MG/ML IV BOLUS
INTRAVENOUS | Status: AC
  Filled 2019-11-23: qty 20

## 2019-11-23 MED ORDER — BUPIVACAINE HCL (PF) 0.25 % IJ SOLN
INTRAMUSCULAR | Status: DC | PRN
  Administered 2019-11-23: 5 mL

## 2019-11-23 MED ORDER — KETOROLAC TROMETHAMINE 30 MG/ML IJ SOLN
30.0000 mg | Freq: Once | INTRAMUSCULAR | Status: DC | PRN
  Administered 2019-11-23: 30 mg via INTRAVENOUS

## 2019-11-23 MED ORDER — ACETAMINOPHEN 500 MG PO TABS
ORAL_TABLET | ORAL | Status: AC
  Filled 2019-11-23: qty 2

## 2019-11-23 MED ORDER — GABAPENTIN 100 MG PO CAPS
ORAL_CAPSULE | ORAL | Status: AC
  Filled 2019-11-23: qty 1

## 2019-11-23 MED ORDER — HEMOSTATIC AGENTS (NO CHARGE) OPTIME
TOPICAL | Status: DC | PRN
  Administered 2019-11-23: 1 via TOPICAL

## 2019-11-23 MED ORDER — DEXAMETHASONE SODIUM PHOSPHATE 4 MG/ML IJ SOLN
INTRAMUSCULAR | Status: DC | PRN
  Administered 2019-11-23: 10 mg via INTRAVENOUS

## 2019-11-23 MED ORDER — EPHEDRINE SULFATE-NACL 50-0.9 MG/10ML-% IV SOSY
PREFILLED_SYRINGE | INTRAVENOUS | Status: DC | PRN
  Administered 2019-11-23: 5 mg via INTRAVENOUS
  Administered 2019-11-23: 20 mg via INTRAVENOUS
  Administered 2019-11-23: 10 mg via INTRAVENOUS

## 2019-11-23 MED ORDER — ACETAMINOPHEN 500 MG PO TABS
1000.0000 mg | ORAL_TABLET | ORAL | Status: AC
  Administered 2019-11-23: 1000 mg via ORAL

## 2019-11-23 MED ORDER — LIDOCAINE HCL (CARDIAC) PF 100 MG/5ML IV SOSY
PREFILLED_SYRINGE | INTRAVENOUS | Status: DC | PRN
  Administered 2019-11-23: 50 mg via INTRAVENOUS

## 2019-11-23 MED ORDER — GABAPENTIN 100 MG PO CAPS
100.0000 mg | ORAL_CAPSULE | ORAL | Status: AC
  Administered 2019-11-23: 100 mg via ORAL

## 2019-11-23 MED ORDER — KETOROLAC TROMETHAMINE 30 MG/ML IJ SOLN
INTRAMUSCULAR | Status: AC
  Filled 2019-11-23: qty 1

## 2019-11-23 MED ORDER — PHENYLEPHRINE 40 MCG/ML (10ML) SYRINGE FOR IV PUSH (FOR BLOOD PRESSURE SUPPORT)
PREFILLED_SYRINGE | INTRAVENOUS | Status: AC
  Filled 2019-11-23: qty 10

## 2019-11-23 MED ORDER — LACTATED RINGERS IV SOLN: INTRAVENOUS | Status: DC

## 2019-11-23 MED ORDER — MIDAZOLAM HCL 2 MG/2ML IJ SOLN
1.0000 mg | INTRAMUSCULAR | Status: DC | PRN
  Administered 2019-11-23: 2 mg via INTRAVENOUS

## 2019-11-23 MED ORDER — OXYCODONE HCL 5 MG PO TABS
5.0000 mg | ORAL_TABLET | Freq: Once | ORAL | Status: AC
  Administered 2019-11-23: 5 mg via ORAL

## 2019-11-23 MED ORDER — FENTANYL CITRATE (PF) 100 MCG/2ML IJ SOLN
50.0000 ug | INTRAMUSCULAR | Status: DC | PRN
  Administered 2019-11-23: 100 ug via INTRAVENOUS

## 2019-11-23 MED ORDER — OXYCODONE HCL 5 MG PO TABS
ORAL_TABLET | ORAL | Status: AC
  Filled 2019-11-23: qty 1

## 2019-11-23 MED ORDER — CEFAZOLIN SODIUM-DEXTROSE 2-4 GM/100ML-% IV SOLN
INTRAVENOUS | Status: AC
  Filled 2019-11-23: qty 100

## 2019-11-23 MED ORDER — PROPOFOL 10 MG/ML IV BOLUS
INTRAVENOUS | Status: DC | PRN
  Administered 2019-11-23: 180 mg via INTRAVENOUS

## 2019-11-23 MED ORDER — FENTANYL CITRATE (PF) 100 MCG/2ML IJ SOLN
25.0000 ug | INTRAMUSCULAR | Status: DC | PRN
  Administered 2019-11-23: 50 ug via INTRAVENOUS

## 2019-11-23 MED ORDER — CEFAZOLIN SODIUM-DEXTROSE 2-4 GM/100ML-% IV SOLN
2.0000 g | INTRAVENOUS | Status: AC
  Administered 2019-11-23: 2 g via INTRAVENOUS

## 2019-11-23 SURGICAL SUPPLY — 56 items
APPLIER CLIP 9.375 MED OPEN (MISCELLANEOUS) ×2
BINDER BREAST LRG (GAUZE/BANDAGES/DRESSINGS) IMPLANT
BINDER BREAST MEDIUM (GAUZE/BANDAGES/DRESSINGS) IMPLANT
BINDER BREAST XLRG (GAUZE/BANDAGES/DRESSINGS) IMPLANT
BINDER BREAST XXLRG (GAUZE/BANDAGES/DRESSINGS) ×1 IMPLANT
BLADE SURG 15 STRL LF DISP TIS (BLADE) ×1 IMPLANT
BLADE SURG 15 STRL SS (BLADE) ×1
CANISTER SUC SOCK COL 7IN (MISCELLANEOUS) IMPLANT
CANISTER SUCT 1200ML W/VALVE (MISCELLANEOUS) IMPLANT
CHLORAPREP W/TINT 26 (MISCELLANEOUS) ×2 IMPLANT
CLIP APPLIE 9.375 MED OPEN (MISCELLANEOUS) IMPLANT
CLIP VESOCCLUDE SM WIDE 6/CT (CLIP) ×2 IMPLANT
COVER BACK TABLE 60X90IN (DRAPES) ×2 IMPLANT
COVER MAYO STAND STRL (DRAPES) ×2 IMPLANT
COVER PROBE W GEL 5X96 (DRAPES) ×2 IMPLANT
COVER WAND RF STERILE (DRAPES) IMPLANT
DECANTER SPIKE VIAL GLASS SM (MISCELLANEOUS) IMPLANT
DERMABOND ADVANCED (GAUZE/BANDAGES/DRESSINGS) ×1
DERMABOND ADVANCED .7 DNX12 (GAUZE/BANDAGES/DRESSINGS) ×1 IMPLANT
DRAPE LAPAROSCOPIC ABDOMINAL (DRAPES) ×2 IMPLANT
DRAPE UTILITY XL STRL (DRAPES) ×2 IMPLANT
ELECT COATED BLADE 2.86 ST (ELECTRODE) ×2 IMPLANT
ELECT REM PT RETURN 9FT ADLT (ELECTROSURGICAL) ×2
ELECTRODE REM PT RTRN 9FT ADLT (ELECTROSURGICAL) ×1 IMPLANT
GLOVE BIO SURGEON STRL SZ7 (GLOVE) ×4 IMPLANT
GLOVE BIOGEL PI IND STRL 7.5 (GLOVE) ×1 IMPLANT
GLOVE BIOGEL PI INDICATOR 7.5 (GLOVE) ×1
GOWN STRL REUS W/ TWL LRG LVL3 (GOWN DISPOSABLE) ×2 IMPLANT
GOWN STRL REUS W/TWL LRG LVL3 (GOWN DISPOSABLE) ×2
HEMOSTAT ARISTA ABSORB 3G PWDR (HEMOSTASIS) ×1 IMPLANT
KIT MARKER MARGIN INK (KITS) ×2 IMPLANT
NDL HYPO 25X1 1.5 SAFETY (NEEDLE) ×1 IMPLANT
NDL SAFETY ECLIPSE 18X1.5 (NEEDLE) IMPLANT
NEEDLE HYPO 18GX1.5 SHARP (NEEDLE)
NEEDLE HYPO 25X1 1.5 SAFETY (NEEDLE) ×2 IMPLANT
NS IRRIG 1000ML POUR BTL (IV SOLUTION) IMPLANT
PENCIL SMOKE EVACUATOR (MISCELLANEOUS) ×2 IMPLANT
RETRACTOR ONETRAX LX 90X20 (MISCELLANEOUS) ×1 IMPLANT
SET BASIN DAY SURGERY F.S. (CUSTOM PROCEDURE TRAY) ×2 IMPLANT
SLEEVE SCD COMPRESS KNEE MED (MISCELLANEOUS) ×2 IMPLANT
SPONGE LAP 4X18 RFD (DISPOSABLE) ×2 IMPLANT
STRIP CLOSURE SKIN 1/2X4 (GAUZE/BANDAGES/DRESSINGS) ×2 IMPLANT
SUT ETHILON 2 0 FS 18 (SUTURE) IMPLANT
SUT MNCRL AB 4-0 PS2 18 (SUTURE) ×2 IMPLANT
SUT MON AB 5-0 PS2 18 (SUTURE) ×1 IMPLANT
SUT SILK 2 0 SH (SUTURE) ×2 IMPLANT
SUT VIC AB 2-0 SH 27 (SUTURE) ×2
SUT VIC AB 2-0 SH 27XBRD (SUTURE) ×1 IMPLANT
SUT VIC AB 3-0 SH 27 (SUTURE) ×2
SUT VIC AB 3-0 SH 27X BRD (SUTURE) ×1 IMPLANT
SUT VIC AB 5-0 PS2 18 (SUTURE) ×1 IMPLANT
SYR CONTROL 10ML LL (SYRINGE) ×2 IMPLANT
TOWEL GREEN STERILE FF (TOWEL DISPOSABLE) ×2 IMPLANT
TRAY FAXITRON CT DISP (TRAY / TRAY PROCEDURE) ×2 IMPLANT
TUBE CONNECTING 20X1/4 (TUBING) IMPLANT
YANKAUER SUCT BULB TIP NO VENT (SUCTIONS) IMPLANT

## 2019-11-23 NOTE — Op Note (Signed)
Preoperative diagnosis: clinical stage Ileft breast cancer Postoperative diagnosis: saa Procedure: 1. Left breast seed guided lumpectomy 2. Left deep axillary sentinel node biopsy Surgeon: Dr Serita Grammes Anesthesia: general with pec block EBL: 25 cc Specimens: 1. Left breast lumpectomy containing both seed and clip 2. Deepleft axillary nodeswithhighest count was39 3. Additional left breast margins medial, anterior, inferior and posterior marked short superior , long lateral double deep Complications none Drains none Sponge and needle count correct dispo recovery stable  Indications:69 yof with family history of breast cancer in 2 paternal and 2 maternal aunts. she has had covid 2/21 and has 3 weeks ago had JJ vaccine. she has no prior breast history. she is on xarelto for afib. 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 she had no mass or dc. she had screening mm that showed a left uoq asymmetry. there is no [place] correlate. dont see where they did [place] of axilla in [place]. Breasts are b density. 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 MRI showed no additional disease and small mass. We elected to proceed with lumpectomy/sn.    Procedure: She hadher seedplaced prior to beginning. After informed consent was obtained she first underwent a pectoral block. Antibiotics were given. SCDs were in place. She underwent injection of technetium in the standard periareolar fashion. She was then placed under general anesthesia. She was prepped and draped in the standard sterile surgical fashion. A surgical timeout was then performed..  The tumor was in thecentral upper breast and I infiltrated marcaine around the areola and then made a periareolar incision to hide the scar later. I then used the neoprobe to remove the breast cancer with an attempt to get a clear margin. Mammogram confirmed removal of the seed. The clip had migrated at  biopsy.I did remove additional margins.I then obtained hemostasis. I then placed clips in the cavity.I pulled the breast tissue together with 2-0 vicryl. The skin was closed with 3-0 vicryl and 5-0 monocryl. Glue and steristrips were placed  I then made a curvilinear incision below the axillary hairline. There was radioactivity noted.I then entered the axilla. I removed what appear to be a couple sentinel nodes with highest radioactivity as above. There was no background radioactivity. Hemostasis was obtained. I closed the axillary fasciawith 2-0 Vicryl. I then closed the dermis with 3-0 Vicryl and the skin with 4-0 Monocryl. Glue and Steri-Strips were applied. She tolerated this well. A binder was placed. She was extubated and transferred to recovery in stable condition.

## 2019-11-23 NOTE — Discharge Instructions (Signed)
No tylenol until 4pm No ibuprofen until [place] Office Phone Number [phone]  BREAST BIOPSY/ PARTIAL MASTECTOMY: POST OP INSTRUCTIONS Take 400 mg of ibuprofen every 8 hours or 650 mg tylenol every 6 hours for next 72 hours then as needed. Use ice several times daily also. Always review your discharge instruction sheet given to you by the facility where your surgery was performed.  IF YOU HAVE DISABILITY OR FAMILY LEAVE FORMS, YOU MUST BRING THEM TO THE OFFICE FOR PROCESSING.  DO NOT GIVE THEM TO YOUR DOCTOR.  1. A prescription for pain medication may be given to you upon discharge.  Take your pain medication as prescribed, if needed.  If narcotic pain medicine is not needed, then you may take acetaminophen (Tylenol), naprosyn (Alleve) or ibuprofen (Advil) as needed. 2. Take your usually prescribed medications unless otherwise directed 3. If you need a refill on your pain medication, please contact your pharmacy.  They will contact our office to request authorization.  Prescriptions will not be filled after 5pm or on week-ends. 4. You should eat very light the first 24 hours after surgery, such as soup, crackers, pudding, etc.  Resume your normal diet the day after surgery. 5. Most patients will experience some swelling and bruising in the breast.  Ice packs and a good support bra will help.  Wear the breast binder provided or a sports bra for 72 hours day and night.  After that wear a sports bra during the day until you return to the office. Swelling and bruising can take several days to resolve.  6. It is common to experience some constipation if taking pain medication after surgery.  Increasing fluid intake and taking a stool softener will usually help or prevent this problem from occurring.  A mild laxative (Milk of Magnesia or Miralax) should be taken according to package directions if there are no bowel movements after 48 hours. 7. Unless discharge  instructions indicate otherwise, you may remove your bandages 48 hours after surgery and you may shower at that time.  You may have steri-strips (small skin tapes) in place directly over the incision.  These strips should be left on the skin for 7-10 days and will come off on their own.  If your surgeon used skin glue on the incision, you may shower in 24 hours.  The glue will flake off over the next 2-3 weeks.  Any sutures or staples will be removed at the office during your follow-up visit. 8. ACTIVITIES:  You may resume regular daily activities (gradually increasing) beginning the next day.  Wearing a good support bra or sports bra minimizes pain and swelling.  You may have sexual intercourse when it is comfortable. a. You may drive when you no longer are taking prescription pain medication, you can comfortably wear a seatbelt, and you can safely maneuver your car and apply brakes. b. RETURN TO WORK:  ______________________________________________________________________________________ 9. You should see your doctor in the office for a follow-up appointment approximately two weeks after your surgery.  Your doctor's nurse will typically make your follow-up appointment when she calls you with your pathology report.  Expect your pathology report 3-4 business days after your surgery.  You may call to check if you do not hear from [place] after three days. 10. OTHER INSTRUCTIONS: _______________________________________________________________________________________________ _____________________________________________________________________________________________________________________________________ _____________________________________________________________________________________________________________________________________ _____________________________________________________________________________________________________________________________________  WHEN TO CALL DR WAKEFIELD: 1. Fever over  101.0 2. Nausea and/or vomiting. 3. Extreme swelling or bruising. 4. Continued bleeding from incision.  5. Increased pain, redness, or drainage from the incision.  The clinic staff is available to answer your questions during regular business hours.  Please don't hesitate to call and ask to speak to one of the nurses for clinical concerns.  If you have a medical emergency, go to the nearest emergency room or call 911.  A surgeon from [facility] is always on call at the hospital.  For further questions, please visit [email or web address]    Post Anesthesia Home Care Instructions  Activity: Get plenty of rest for the remainder of the day. A responsible individual must stay with you for 24 hours following the procedure.  For the next 24 hours, DO NOT: -Drive a car -Paediatric nurse -Drink alcoholic beverages -Take any medication unless instructed by your physician -Make any legal decisions or sign important papers.  Meals: Start with liquid foods such as gelatin or soup. Progress to regular foods as tolerated. Avoid greasy, spicy, heavy foods. If nausea and/or vomiting occur, drink only clear liquids until the nausea and/or vomiting subsides. Call your physician if vomiting continues.  Special Instructions/Symptoms: Your throat may feel dry or sore from the anesthesia or the breathing tube placed in your throat during surgery. If this causes discomfort, gargle with warm salt water. The discomfort should disappear within 24 hours.  If you had a scopolamine patch placed behind your ear for the management of post- operative nausea and/or vomiting:  1. The medication in the patch is effective for 72 hours, after which it should be removed.  Wrap patch in a tissue and discard in the trash. Wash hands thoroughly with soap and water. 2. You may remove the patch earlier than 72 hours if you experience unpleasant side effects which may include dry mouth, dizziness or visual  disturbances. 3. Avoid touching the patch. Wash your hands with soap and water after contact with the patch.

## 2019-11-23 NOTE — Anesthesia Preprocedure Evaluation (Addendum)
Anesthesia Evaluation  Patient identified by MRN, date of birth, ID band Patient awake    Reviewed: Allergy & Precautions, NPO status , Patient's Chart, lab work & pertinent test results, reviewed documented beta blocker date and time   Airway Mallampati: III  TM Distance: >3 FB Neck ROM: Full    Dental no notable dental hx. (+) Teeth Intact, Dental Advisory Given   Pulmonary neg pulmonary ROS,    Pulmonary exam normal breath sounds clear to auscultation       Cardiovascular hypertension, Pt. on home beta blockers and Pt. on medications Normal cardiovascular exam+ dysrhythmias Atrial Fibrillation  Rhythm:Regular Rate:Normal  TTE 2018 Normal EF, mild MR   Neuro/Psych  Neuromuscular disease (trigeminal neuralgia) negative psych ROS   GI/Hepatic negative GI ROS, Neg liver ROS,   Endo/Other  Hypothyroidism Morbid obesity (BMI 41)  Renal/GU negative Renal ROS  negative genitourinary   Musculoskeletal negative musculoskeletal ROS (+)   Abdominal   Peds  Hematology  (+) Blood dyscrasia (on xarelto), ,   Anesthesia Other Findings   Reproductive/Obstetrics                            Anesthesia Physical Anesthesia Plan  ASA: III  Anesthesia Plan: General and Regional   Post-op Pain Management:  Regional for Post-op pain   Induction: Intravenous  PONV Risk Score and Plan: 3 and Midazolam, Dexamethasone and Ondansetron  Airway Management Planned: Oral ETT  Additional Equipment:   Intra-op Plan:   Post-operative Plan: Extubation in OR  Informed Consent: I have reviewed the patients History and Physical, chart, labs and discussed the procedure including the risks, benefits and alternatives for the proposed anesthesia with the patient or authorized representative who has indicated his/her understanding and acceptance.     Dental advisory given  Plan Discussed with: CRNA  Anesthesia Plan  Comments:         Anesthesia Quick Evaluation

## 2019-11-23 NOTE — Anesthesia Postprocedure Evaluation (Signed)
Anesthesia Post Note  Patient: SHULAMIT CALLIHAN  Procedure(s) Performed: LEFT BREAST LUMPECTOMY WITH RADIOACTIVE SEED AND LEFT AXILLARY SENTINEL LYMPH NODE BIOPSY (Left Breast)     Patient location during evaluation: PACU Anesthesia Type: General and Regional Level of consciousness: awake and alert Pain management: pain level controlled Vital Signs Assessment: post-procedure vital signs reviewed and stable Respiratory status: spontaneous breathing, nonlabored ventilation, respiratory function stable and patient connected to nasal cannula oxygen Cardiovascular status: blood pressure returned to baseline and stable Postop Assessment: no apparent nausea or vomiting Anesthetic complications: no    Last Vitals:  Vitals:   11/23/19 1315 11/23/19 1346  BP: 127/76 136/73  Pulse: (!) 54 (!) 59  Resp: 10 18  Temp:  36.6 C  SpO2: 96% 95%    Last Pain:  Vitals:   11/23/19 1346  TempSrc: Oral  PainSc: 4                  Haydan Mansouri L Lyndie Vanderloop

## 2019-11-23 NOTE — Progress Notes (Signed)
   Assisted Dr. Woodrum with left, ultrasound guided, pectoralis block. Side rails up, monitors on throughout procedure. See vital signs in flow sheet. Tolerated Procedure well. 

## 2019-11-23 NOTE — Progress Notes (Signed)
Nuc med injections completed. Patient tolerated well.  Emotional support provided.

## 2019-11-23 NOTE — Anesthesia Procedure Notes (Signed)
Procedure Name: LMA Insertion Date/Time: 11/23/2019 10:59 AM Performed by: [place] Keaun Schnabel C, CRNA Pre-anesthesia Checklist: Patient identified, Emergency Drugs available, Suction available and Patient being monitored Patient Re-evaluated:Patient Re-evaluated prior to induction Oxygen Delivery Method: Circle system utilized Preoxygenation: Pre-oxygenation with 100% oxygen Induction Type: IV induction Ventilation: Mask ventilation without difficulty LMA: LMA inserted LMA Size: 4.0 Number of attempts: 1 Airway Equipment and Method: Bite block Placement Confirmation: positive ETCO2 Tube secured with: Tape Dental Injury: Teeth and Oropharynx as per pre-operative assessment

## 2019-11-23 NOTE — Anesthesia Procedure Notes (Signed)
Anesthesia Regional Block: Pectoralis block   Pre-Anesthetic Checklist: ,, timeout performed, Correct Patient, Correct Site, Correct Laterality, Correct Procedure, Correct Position, site marked, Risks and benefits discussed,  Surgical consent,  Pre-op evaluation,  At surgeon's request and post-op pain management  Laterality: Left  Prep: Maximum Sterile Barrier Precautions used, chloraprep       Needles:  Injection technique: Single-shot  Needle Type: Echogenic Stimulator Needle     Needle Length: 9cm  Needle Gauge: 22     Additional Needles:   Procedures:,,,, ultrasound used (permanent image in chart),,,,  Narrative:  Start time: 11/23/2019 9:40 AM End time: 11/23/2019 9:50 AM Injection made incrementally with aspirations every 5 mL.  Performed by: Personally  Anesthesiologist: Freddrick March, MD  Additional Notes: Monitors applied. No increased pain on injection. No increased resistance to injection. Injection made in 5cc increments. Good needle visualization. Patient tolerated procedure well.

## 2019-11-23 NOTE — Transfer of Care (Signed)
Immediate Anesthesia Transfer of Care Note  Patient: Lisa Cardenas  Procedure(s) Performed: LEFT BREAST LUMPECTOMY WITH RADIOACTIVE SEED AND LEFT AXILLARY SENTINEL LYMPH NODE BIOPSY (Left Breast)  Patient Location: PACU  Anesthesia Type:General  Level of Consciousness: awake, alert  and oriented  Airway & Oxygen Therapy: Patient Spontanous Breathing and Patient connected to face mask oxygen  Post-op Assessment: Report given to RN and Post -op Vital signs reviewed and stable  Post vital signs: Reviewed and stable  Last Vitals:  Vitals Value Taken Time  BP 118/51 11/23/19 1210  Temp    Pulse 58 11/23/19 1212  Resp 16 11/23/19 1212  SpO2 100 % 11/23/19 1212  Vitals shown include unvalidated device data.  Last Pain:  Vitals:   11/23/19 0946  TempSrc: Oral  PainSc: 0-No pain      Patients Stated Pain Goal: 3 ([id number])  Complications: No apparent anesthesia complications

## 2019-11-23 NOTE — H&P (Signed)
.18 yof with family history of breast cancer in 2 paternal and 2 maternal aunts. she has had covid 2/21 and has 3 weeks ago had JJ vaccine. she has no prior breast history. she is on xarelto for afib. 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 she had no mass or dc. she had screening mm that showed a left uoq asymmetry. there is no [place] correlate. dont see where they did [place] of axilla in [place]. Breasts are b density. 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 she is here to discuss options   Past Surgical History Breast Biopsy  Left. Colon Polyp Removal - Colonoscopy  Hysterectomy (not due to cancer) - Partial  Knee Surgery  Left. Tonsillectomy   Diagnostic Studies History  Colonoscopy  within last year Mammogram  within last year Pap Smear  >5 years ago  Allergies  No Known Drug Allergies   Allergies Reconciled   Medication History LORazepam (0.5MG Tablet, Oral) Active. Sertraline HCl (100MG Tablet, Oral) Active. Simvastatin (40MG Tablet, Oral) Active. Primidone (50MG Tablet, Oral) Active. Olmesartan Medoxomil-HCTZ (20-12.5MG Tablet, Oral) Active. Xarelto (20MG Tablet, Oral) Active. Gabapentin (300MG Capsule, Oral) Active. Levothyroxine Sodium (137MCG Tablet, Oral) Active. Esomeprazole Magnesium (40MG Capsule DR, Oral) Active. carBAMazepine (200MG Tablet, Oral) Active. Metoprolol Tartrate (100MG Tablet, Oral) Active. Vitamin D (Ergocalciferol) (1.25 MG(50000 UT) Capsule, Oral) Active. Medications Reconciled  Social History  Caffeine use  Carbonated beverages, Tea. No alcohol use  No drug use  Tobacco use  Never smoker.  Family History  Arthritis  Mother. Breast Cancer  Family Members In General. Colon Cancer  Family Members In General, Mother. Diabetes Mellitus  Daughter. Heart Disease  Mother. Hypertension  Mother. Migraine Headache  Mother. Prostate Cancer  Family Members In  General. Respiratory Condition  Father. Thyroid problems  Daughter, Mother.    Physical Exam Rolm Bookbinder MD; 10/23/2019 2:00 PM) General Mental Status-Alert. Orientation-Oriented X3. Breast Nipples-No Discharge. Breast Lump-No Palpable Breast Mass. Lymphatic Head & Neck General Head & Neck Lymphatics: Bilateral - Description - Normal. Axillary General Axillary Region: Bilateral - Description - Normal. 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 We discussed all of the different options for treatment for breast cancer including surgery, chemotherapy, radiation therapy, Herceptin, and antiestrogen therapy. We discussed a sentinel node biopsy as nodes appear negative. We discussed the performance of that with injection of radioactive tracer. we will await the permanent pathology to make any other first further decisions in terms of her treatment. We discussed up to a 5-7% risk lifetime of chronic shoulder pain as well as lymphedema associated with a sentinel lymph node biopsy. will get sozo measurements. We discussed the options for treatment of the breast cancer which included lumpectomy versus a mastectomy. We discussed the performance of the lumpectomy with radioactive seed placement. We discussed a 5-10% chance of a positive margin requiring reexcision in the operating room. We also discussed that she will need radiation therapy if she undergoes lumpectomy. The breast cannot undergo more radiation therapy in the same breast after lumpectomy in the future. We discussed mastectomy and the postoperative care for that as well. Mastectomy can be followed by reconstruction. The decision for lumpectomy vs mastectomy has no impact on decision for chemotherapy. Most mastectomy  patients will not  need radiation therapy. We discussed that there is no difference in her survival whether she undergoes lumpectomy with radiation therapy or antiestrogen therapy versus a mastectomy. There is also no real difference between her recurrence in the breast. We discussed the risks of operation including bleeding, infection, possible reoperation. She understands her further therapy is based on surgery results

## 2019-11-24 ENCOUNTER — Encounter: Payer: Self-pay | Admitting: *Deleted

## 2019-11-27 ENCOUNTER — Other Ambulatory Visit: Payer: Medicare Other

## 2019-11-28 ENCOUNTER — Encounter: Payer: Self-pay | Admitting: *Deleted

## 2019-11-28 ENCOUNTER — Telehealth: Payer: Self-pay | Admitting: *Deleted

## 2019-11-28 LAB — SURGICAL PATHOLOGY

## 2019-11-28 NOTE — Telephone Encounter (Signed)
Ordered oncotype per Dr. Magrinat.  Faxed requisition to pathology. 

## 2019-12-12 ENCOUNTER — Telehealth: Payer: Self-pay | Admitting: *Deleted

## 2019-12-12 ENCOUNTER — Encounter: Payer: Self-pay | Admitting: *Deleted

## 2019-12-12 NOTE — Telephone Encounter (Signed)
Received oncotype results of 18/5%.  Patient is aware.  Msg sent for an appointment with Dr. Isidore Moos.  Patient would like to keep appointment with Dr. Jana Hakim for 6/21.

## 2019-12-13 ENCOUNTER — Encounter (HOSPITAL_COMMUNITY): Payer: Self-pay | Admitting: Oncology

## 2019-12-14 ENCOUNTER — Encounter: Payer: Self-pay | Admitting: *Deleted

## 2019-12-14 ENCOUNTER — Encounter: Payer: Self-pay | Admitting: Oncology

## 2019-12-20 NOTE — Progress Notes (Signed)
Location of Breast Cancer: Malignant neoplasm of upper-outer quadrant of LEFT breast, estrogen receptor positive  Histology per Pathology Report:  11/23/2019 FINAL MICROSCOPIC DIAGNOSIS:  A. BREAST, LEFT, LUMPECTOMY:  - Invasive and in situ lobular carcinoma, 0.9 cm.  - Margins not involved.  - Biopsy site changes.  B. SENTINEL LYMPH NODE, LEFT AXILLARY, BIOPSY:  - One lymph node with no metastatic carcinoma (0/1).  C. BREAST, LEFT ADDITIONAL MEDIAL MARGIN, EXCISION:  - Benign breast tissue.  - No malignancy identified.  - Final medial margin greater than 1 cm.  D. BREAST, LEFT ADDITIONAL POSTERIOR MARGIN, EXCISION:  - Benign breast tissue.  - No malignancy identified.  - Final posterior margin greater than 1 cm.  E. BREAST, LEFT ADDITIONAL INFERIOR MARGIN, EXCISION:  - Benign breast tissue.  - No malignancy identified.  - Final inferior margin greater than 1 cm.  F. BREAST, LEFT ADDITIONAL ANTERIOR MARGIN, EXCISION:  - Benign breast tissue.  - No malignancy identified.  - Final anterior margin greater than 1 cm.   Receptor Status: ER(90%), PR (90%), Her2-neu (negative), Ki-67 (5-7%)  Did patient present with symptoms (if so, please note symptoms) or was this found on screening mammography?:  Routine screening mammography on01/28/2021showing a possible abnormality in the leftbreast. She underwent leftdiagnostic mammography with tomography and leftbreast ultrasonography in [place], Kerin Ransom 3/1/2021showing: scattered fibroglandular densitiesasymmetry in upper-outer left breast with no definitive sonographic correlate  Past/Anticipated interventions by surgeon, if any: 11/23/2019 Dr. Rolm Bookbinder 1. Left breast seed guided lumpectomy 2. Left deep axillary sentinel node   Past/Anticipated interventions by medical oncology, if any:  Under care of Dr. Sarajane Jews Magrinat 1) chemotherapy not anticipated (per [facility]-Breast Navigator "Received oncotype results of  18/5%") 2) adjuvant radiation 3) to start anastrozole at the completion of local treatment F/U with Dr. Jana Hakim for 01/01/2020  Lymphedema issues, if any:  Patient denies    Pain issues, if any:  Minor discomfort to left axilla   SAFETY ISSUES:  Prior radiation? No  Pacemaker/ICD? No  Possible current pregnancy? No--hysterectomy in 1980  Is the patient on methotrexate? No  Current Complaints / other details:  Patient reports she still has a hematoma to the left breast. Dr. Donne Hazel is aware and told her to massage area with vitamin E oil, but patient doesn't feel area has improved. She also is dealing with a flare up of her trigeminal neuralgia, and has had to increase her Tegretol and Gabapentin prescriptions to manage.

## 2019-12-22 ENCOUNTER — Ambulatory Visit
Admission: RE | Admit: 2019-12-22 | Discharge: 2019-12-22 | Disposition: A | Discharge status: Home / Self Care | Payer: Medicare Other | Source: Ambulatory Visit | Attending: Radiation Oncology | Admitting: Radiation Oncology

## 2019-12-22 ENCOUNTER — Other Ambulatory Visit: Payer: Self-pay

## 2019-12-22 ENCOUNTER — Encounter: Payer: Self-pay | Admitting: Radiation Oncology

## 2019-12-22 VITALS — BP 141/64 | HR 54 | Temp 97.5°F | Resp 20 | Ht 64.0 in | Wt 249.2 lb

## 2019-12-22 DIAGNOSIS — Z170 Estrogen receptor positive status [ER+]: Secondary | ICD-10-CM | POA: Insufficient documentation

## 2019-12-22 DIAGNOSIS — C50412 Malignant neoplasm of upper-outer quadrant of left female breast: Secondary | ICD-10-CM

## 2019-12-22 DIAGNOSIS — Z510 Encounter for antineoplastic radiation therapy: Secondary | ICD-10-CM | POA: Insufficient documentation

## 2019-12-22 NOTE — Progress Notes (Signed)
Radiation Oncology         [phone]) [phone] ________________________________  Name: Lisa Cardenas MRN: [id number]  Date: 12/22/2019  DOB: 09-Jul-1951  Follow-Up Visit Note  Outpatient  CC: Moshe Cipro, MD  Magrinat, Virgie Dad, MD  Diagnosis:      ICD-10-CM   1. Malignant neoplasm of upper-outer quadrant of left breast in female, estrogen receptor positive ([place])  C50.412    Z17.0      Cancer Staging Malignant neoplasm of upper-outer quadrant of left breast in female, estrogen receptor positive ([place]) Staging form: Breast, AJCC 8th Edition - Clinical: Stage IA (cT1a, cN0, cM0, G2, ER+, PR+, HER2-) - Signed by Gardenia Phlegm, NP on 11/01/2019 - Pathologic stage from 11/23/2019: Stage IA (pT1b, pN0, cM0, G2, ER+, PR+, HER2-) - Signed by Gardenia Phlegm, NP on 12/13/2019   CHIEF COMPLAINT: Here to discuss management of left breast cancer  Narrative:  The patient returns today for follow-up to discuss radiation treatment options. She was seen in consultation on 11/07/19.     Since consultation date, she underwent breast MRI on 11/07/19 revealing: 7 mm biopsy-proven upper-outer left breast malignancy; no evidence of malignancy in the remainder of the left breast, the right breast, or either axilla.    She opted to proceed with left lumpectomy on date of 11/23/19 with pathology report revealing: tumor size of 0.9 cm; histology of lobular carcinoma; margin status to invasive disease of >10 mm and margin status to in situ disease of >10 mm; nodal status of negative (0/1); Grade 2.  Oncotype DX was obtained on the final surgical sample and the recurrence score of 18 predicts a risk of recurrence outside the breast over the next 9 years of 5%, if the patient's only systemic therapy is an antiestrogen for 5 years.  It also predicts no benefit from chemotherapy.  Symptomatically, the patient reports: Patient reports she still has a hematoma to the left breast. Dr. Donne Hazel is aware  and told her to massage area with vitamin E oil, but patient doesn't feel area has improved. She also is dealing with a flare up of her trigeminal neuralgia, and has had to increase her Tegretol and Gabapentin prescriptions to manage.          ALLERGIES:  is allergic to plaquenil [hydroxychloroquine].  Meds: Current Outpatient Medications  Medication Sig Dispense Refill  . carbamazepine (TEGRETOL) 200 MG tablet Take 200 mg by mouth daily with breakfast.     . celecoxib (CELEBREX) 200 MG capsule Take 200 mg by mouth every 30 (thirty) days.    Marland Kitchen esomeprazole (NEXIUM) 40 MG capsule Take 40 mg by mouth 2 (two) times daily as needed.    . gabapentin (NEURONTIN) 300 MG capsule Take 300 mg by mouth 2 (two) times daily. One in late morning and one at night    . levothyroxine (SYNTHROID) 137 MCG tablet Take 137 mcg by mouth daily. Current dose 137 mcg daily    . LORazepam (ATIVAN) 0.5 MG tablet Take 0.5 mg by mouth as needed for anxiety.    . metoprolol succinate (TOPROL-XL) 100 MG 24 hr tablet Take 100 mg by mouth 2 (two) times daily.    Marland Kitchen olmesartan-hydrochlorothiazide (BENICAR HCT) 20-12.5 MG tablet Take 1 tablet by mouth daily.    . primidone (MYSOLINE) 50 MG tablet Take 50 mg by mouth daily.     . rivaroxaban (XARELTO) 20 MG TABS tablet Take 20 mg by mouth daily.    . sertraline (ZOLOFT) 100 MG  tablet Take 50 mg by mouth daily.    . sertraline (ZOLOFT) 50 MG tablet Take 50 mg by mouth daily.    . simvastatin (ZOCOR) 40 MG tablet Take 40 mg by mouth daily.    . traMADol (ULTRAM) 50 MG tablet Take 1 tablet (50 mg total) by mouth every 6 (six) hours as needed. 10 tablet 0  . Vitamin D, Ergocalciferol, (DRISDOL) 1.25 MG (50000 UNIT) CAPS capsule Take 50,000 Units by mouth once a week.     No current facility-administered medications for this encounter.    Physical Findings:  height is _0  (1.626 m) and weight is 249 lb 3.2 oz (113 kg). Her temperature is 97.5 F (36.4 C) (abnormal). Her blood  pressure is 141/64 (abnormal) and her pulse is 54 (abnormal). Her respiration is 20 and oxygen saturation is 97%. .     General: Alert and oriented, in no acute distress Psychiatric: Judgment and insight are intact. Affect is appropriate. Breast exam reveals diffuse left breast bruising and palpable seroma v hematoma; no sign of infection; scars healed well.  Lab Findings: Lab Results  Component Value Date   WBC 6.7 10/26/2019   HGB 14.2 10/26/2019   HCT 44.0 10/26/2019   MCV 87.0 10/26/2019   PLT 219 10/26/2019    Radiographic Findings: NM Sentinel Node Inj-No Rpt (Breast)  Result Date: 11/23/2019 Sulfur colloid was injected by the nuclear medicine technologist for melanoma sentinel node.   MM Breast Surgical Specimen  Result Date: 11/23/2019 CLINICAL DATA:  Radioactive seed localization was performed of the left breast on Nov 23, 2019 prior to lumpectomy. Please note that the biopsy clip is known to be approximately 1 cm and 2 cm inferior to the site of left breast cancer. EXAM: SPECIMEN RADIOGRAPH OF THE LEFT BREAST COMPARISON:  Previous exam(s). FINDINGS: Status post excision of the left breast. The radioactive seed is present completely intact, and were marked for pathology. The biopsy clip, which was separate from the biopsy-proven cancer is not included in the specimen. IMPRESSION: Specimen radiograph of the left breast. Electronically Signed   By: Curlene Dolphin M.D.   On: 11/23/2019 11:33   MM LT RADIOACTIVE SEED LOC MAMMO GUIDE  Result Date: 11/23/2019 CLINICAL DATA:  Newly diagnosed LEFT breast cancer scheduled for breast conservation surgery requiring preoperative radioactive seed localization. EXAM: MAMMOGRAPHIC GUIDED RADIOACTIVE SEED LOCALIZATION OF THE LEFT BREAST COMPARISON:  Previous exam(s). FINDINGS: Patient presents for radioactive seed localization prior to breast conservation surgery. I met with the patient and we discussed the procedure of seed localization including  benefits and alternatives. We discussed the high likelihood of a successful procedure. We discussed the risks of the procedure including infection, bleeding, tissue injury and further surgery. We discussed the low dose of radioactivity involved in the procedure. Informed, written consent was given. Case was reviewed with Dr. Donne Hazel per the procedure. The usual time-out protocol was performed immediately prior to the procedure. Using mammographic guidance, sterile technique, 1% lidocaine and an I-125 radioactive seed, the mass and underlying biopsy clip was localized using a lateral approach. The follow-up mammogram images confirm the seed in the expected location and were marked for Dr. Donne Hazel. Follow-up survey of the patient confirms presence of the radioactive seed. Order number of I-125 seed:  [id number]. Total activity:  7.544 millicuries reference Date: 10/26/2019 The patient tolerated the procedure well and was released from the [facility]. She was given instructions regarding seed removal. IMPRESSION: Radioactive seed localization left breast. No apparent complications.  Electronically Signed   By: Franki Cabot M.D.   On: 11/23/2019 08:18    Impression/Plan: Left Breast Cancer  We discussed adjuvant radiotherapy today.  I recommend 3.5 weeks directed at the left breast in order to reduce the risk of locoregional recurrence by 2/3.  The risks, benefits and side effects of this treatment were discussed in detail.  She understands that radiotherapy is associated with skin irritation and fatigue in the acute setting. Late effects can include cosmetic changes and rare injury to internal organs.  She is enthusiastic about proceeding with treatment. A consent form has been signed and placed in her chart.  I suspect her hematoma v seroma will linger for months; it is not prohibitive against starting RT.  We discussed her trigeminal neuralgia - if it does not respond adequately to her medication I am  happen to refer her to a neurosurgeon for alternative therapies.  Simulation will occur today.  On date of service, in total, I spent 30 min on this encounter. She was seen in person. _____________________________________   Eppie Gibson, MD   This document serves as a record of services personally performed by Eppie Gibson, MD. It was created on her behalf by Wilburn Mylar, a trained medical scribe. The creation of this record is based on the scribe's personal observations and the provider's statements to them. This document has been checked and approved by the attending provider.

## 2019-12-23 ENCOUNTER — Encounter: Payer: Self-pay | Admitting: Radiation Oncology

## 2019-12-26 DIAGNOSIS — Z510 Encounter for antineoplastic radiation therapy: Secondary | ICD-10-CM | POA: Diagnosis not present

## 2019-12-27 ENCOUNTER — Encounter: Payer: Self-pay | Admitting: *Deleted

## 2019-12-31 NOTE — Progress Notes (Signed)
[facility]  Telephone:(336) [phone] Fax:(336) [phone]     ID: Lisa Cardenas DOB: 11-24-2000  MR#: [id number]  MEQ#:[id number]  Patient Care Team: Moshe Cipro, MD as PCP - General (Internal Medicine) Rolm Bookbinder, MD as Consulting Physician (General Surgery) Sheliah Fiorillo, Virgie Dad, MD as Consulting Physician (Oncology) Pyrtle, Lajuan Lines, MD as Consulting Physician (Gastroenterology) Rockwell Germany, RN as Oncology Nurse Navigator Mauro Kaufmann, RN as Oncology Nurse Navigator Chauncey Cruel, MD OTHER MD:  CHIEF COMPLAINT: Estrogen receptor positive lobular breast cancer  CURRENT TREATMENT: Adjuvant ready   INTERVAL HISTORY: [name] returns today for follow up of her estrogen receptor positive lobular breast cancer. She was evaluated in the breast cancer clinic on 10/26/2019.  Since her last visit here she underwent left lumpectomy and sentinel lymph node biopsy 11/23/2019, showing ([id number]) invasive lobular carcinoma, measuring 0.9 cm, with negative margins.  The single sentinel lymph node was clear.  An Oncotype was obtained on the material showing a score of 18, predicting a risk of recurrence outside the breast after 9 years of 5% if the patient's only systemic therapy is antiestrogens for 5 years.  She did well with her surgery and is already scheduled to start radiation treatments 01/02/2020   REVIEW OF SShe has already underYSTEMS: Paulena took primarily Tylenol for her postoperative pain.  She says she has a seroma in the surgical breast.  It is not tender or erythematous and she has had no fevers and no dehiscence.  She feels a bit tired but still get through her ADLs without difficulty..  A detailed review of systems today was otherwise stable.   HISTORY OF CURRENT ILLNESS: From the original intake note:  Lisa Cardenas [pronounced [id number]] had routine screening mammography on 08/10/2019 showing a possible abnormality in the left breast. She  underwent left diagnostic mammography with tomography and left breast ultrasonography in [place], [place] on 09/11/2019 showing: scattered fibroglandular densities asymmetry in upper-outer left breast with no definitive sonographic correlate.  Accordingly on 10/06/2019 she proceeded to biopsy of the left breast area in question. The pathology from this procedure ([id number] at [facility] [place]) ) showed: invasive lobular carcinoma, E-cadherin negative, grade 1. Prognostic indicators significant for: estrogen receptor, greater than 90% positive and progesterone receptor, greater than 90% positive, both with strong staining intensity proliferation marker Ki67 at 5-7%. HER2 negative by immunohistochemistry.  The patient's subsequent history is as detailed below.   PAST MEDICAL HISTORY: Past Medical History:  Diagnosis Date  . A-fib [facility])    no issues since May 2020  . Arrhythmia   . Diverticulosis   . Elevated cholesterol   . Family history of breast cancer   . Family history of melanoma   . Family history of pancreatic cancer   . Family history of prostate cancer   . Hypertension   . Obesity   . Pancreatitis   . Thyroid disease   . Trigeminal neuralgia   . Trigeminal neuralgia   . UTI (urinary tract infection)   Chronic anticoagulation secondary to atrial fibrillation, GERD, irritable bowel syndrome   PAST SURGICAL HISTORY: Past Surgical History:  Procedure Laterality Date  . ABDOMINAL HYSTERECTOMY  1980  . BLADDER REPAIR    . BREAST LUMPECTOMY WITH RADIOACTIVE SEED AND SENTINEL LYMPH NODE BIOPSY Left 11/23/2019   Procedure: LEFT BREAST LUMPECTOMY WITH RADIOACTIVE SEED AND LEFT AXILLARY SENTINEL LYMPH NODE BIOPSY;  Surgeon: Rolm Bookbinder, MD;  Location: [employer] [facility];  Service: General;  Laterality: Left;  PEC BLOCK  . [facility]  [facility]     [facility]  . KNEE ARTHROSCOPY Left   . TONSILLECTOMY    Ovaries still in place, right  fallopian tube removed, status post cystocele repair   FAMILY HISTORY: Family History  Problem Relation Age of Onset  . Colon cancer Mother        dx 48  . Breast cancer Maternal Grandmother   . Breast cancer Maternal Aunt   . Prostate cancer Maternal Uncle   . Breast cancer Paternal Aunt   . Melanoma Paternal Uncle   . Breast cancer Maternal Aunt   . Leukemia Maternal Uncle   . Breast cancer Paternal Aunt   . Colon cancer Paternal Aunt   . Melanoma Daughter   The family history is best documented in the genetics note but in brief the patient's father died at the age of 79 from sepsis and the patient's mother at the age of 70 from COPD.  The patient's mother had colon cancer.  The maternal grandmother had breast cancer in her late 11s and 2 maternal aunts had breast cancer and 1 maternal cousin had breast cancer and later developed pancreatic cancer.  On the father's side 2 paternal aunts had breast cancer, one paternal aunt had colon cancer, all older than 40 years old, 1 paternal uncle had leukemia and one paternal uncle had prostate cancer   GYNECOLOGIC HISTORY:  No LMP recorded. Patient has had a hysterectomy. Menarche: 69 years old Age at first live birth: 69 years old [place] P 3 LMP hysterectomy age 14 Contraceptive used oral contraceptives only a few months, without complication HRT estrogen only for 7 to 10 years  Hysterectomy? yes BSO?  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  Her husband of more than 78 years, Lisa Cardenas, has a Oceanographer in Engineer, mining and work in Social research officer, government for [employer].  He is retired and now Health and safety inspector.  Daughter Lisa Cardenas lives in [place] and owns and runs a center for pediatric treatment as well as a school for autistic children's.  Daughter Lisa Cardenas in [place] has a Oceanographer in Animal nutritionist studies and teaches teaching at [employer]; daughter Lisa Cardenas lives in [place] and works at [employer] base there.  The patient has 3 grandchildren.  She is a member of the [employer] denomination.   ADVANCED DIRECTIVES: In the absence of any documents to the contrary the patient's husband is her healthcare power of attorney   HEALTH MAINTENANCE: Social History   Tobacco Use  . Smoking status: Never Smoker  . Smokeless tobacco: Never Used  Vaping Use  . Vaping Use: Never used  Substance Use Topics  . Alcohol use: Not Currently  . Drug use: Not Currently     Colonoscopy: October 2020/ Pyrtle  PAP: 2016  Bone density: Remote   Allergies  Allergen Reactions  . Plaquenil [Hydroxychloroquine]     Increased heart rate    Current Outpatient Medications  Medication Sig Dispense Refill  . carbamazepine (TEGRETOL) 200 MG tablet Take 200 mg by mouth daily with breakfast.     . celecoxib (CELEBREX) 200 MG capsule Take 200 mg by mouth every 30 (thirty) days.    Marland Kitchen esomeprazole (NEXIUM) 40 MG capsule Take 40 mg by mouth 2 (two) times daily as needed.    . gabapentin (NEURONTIN) 300 MG capsule Take  300 mg by mouth 2 (two) times daily. One in late morning and one at night    . levothyroxine (SYNTHROID) 137 MCG tablet Take 137 mcg by mouth daily. Current dose 137 mcg daily    . LORazepam (ATIVAN) 0.5 MG tablet Take 0.5 mg by mouth as needed for anxiety.    . metoprolol succinate (TOPROL-XL) 100 MG 24 hr tablet Take 100 mg by mouth 2 (two) times daily.    Marland Kitchen olmesartan-hydrochlorothiazide (BENICAR HCT) 20-12.5 MG tablet Take 1 tablet by mouth daily.    . primidone (MYSOLINE) 50 MG tablet Take 50 mg by mouth daily.     . rivaroxaban (XARELTO) 20 MG TABS tablet Take 20 mg by mouth daily.    . sertraline (ZOLOFT) 100 MG tablet Take 50 mg by mouth daily.    . sertraline (ZOLOFT) 50 MG tablet Take 50 mg by mouth daily.    . simvastatin (ZOCOR) 40 MG tablet Take 40 mg by mouth daily.    . traMADol (ULTRAM) 50 MG tablet Take 1 tablet (50 mg total) by mouth every 6  (six) hours as needed. 10 tablet 0  . Vitamin D, Ergocalciferol, (DRISDOL) 1.25 MG (50000 UNIT) CAPS capsule Take 50,000 Units by mouth once a week.     No current facility-administered medications for this visit.    OBJECTIVE: White woman in no acute distress  Vitals:   01/01/20 1504  BP: (!) 120/55  Pulse: 61  Resp: 18  Temp: 98.3 F (36.8 C)  SpO2: 99%     Body mass index is 42.55 kg/m.   Wt Readings from Last 3 Encounters:  01/01/20 247 lb 14.4 oz (112.4 kg)  12/22/19 249 lb 3.2 oz (113 kg)  11/23/19 240 lb 15.4 oz (109.3 kg)      ECOG FS:1 - Symptomatic but completely ambulatory  Sclerae unicteric, EOMs intact Wearing a mask No cervical or supraclavicular adenopathy Lungs no rales or rhonchi Heart regular rate and rhythm Abd soft, obese nontender, positive bowel sounds MSK no focal spinal tenderness, no upper extremity lymphedema Neuro: nonfocal, well oriented, appropriate affect Breasts: The right breast is benign.  The left breast is status post lumpectomy.  The incisions are healing very nicely.  There is no dehiscence or erythema.  In the superior aspect of the breast there is an area which may be a seroma or may be scar tissue.  It is fairly firm.  Both axillae are benign.   LAB RESULTS:  CMP     Component Value Date/Time   NA 138 11/20/2019 1100   K 4.0 11/20/2019 1100   CL 101 11/20/2019 1100   CO2 27 11/20/2019 1100   GLUCOSE 100 (H) 11/20/2019 1100   BUN 15 11/20/2019 1100   CREATININE 0.76 11/20/2019 1100   CREATININE 0.78 10/26/2019 1507   CALCIUM 9.8 11/20/2019 1100   PROT 7.4 10/26/2019 1507   ALBUMIN 4.1 10/26/2019 1507   AST 11 (L) 10/26/2019 1507   ALT 15 10/26/2019 1507   ALKPHOS 79 10/26/2019 1507   BILITOT 0.4 10/26/2019 1507   GFRNONAA >60 11/20/2019 1100   GFRNONAA >60 10/26/2019 1507   GFRAA >60 11/20/2019 1100   GFRAA >60 10/26/2019 1507    No results found for: TOTALPROTELP, ALBUMINELP, A1GS, A2GS, BETS, BETA2SER, GAMS,  MSPIKE, SPEI  Lab Results  Component Value Date   WBC 6.7 10/26/2019   NEUTROABS 4.6 10/26/2019   HGB 14.2 10/26/2019   HCT 44.0 10/26/2019   MCV 87.0 10/26/2019  PLT 219 10/26/2019    No results found for: LABCA2  No components found for: [id number]  No results for input(s): INR in the last 168 hours.  No results found for: LABCA2  No results found for: WUJ811  No results found for: BJY782  No results found for: NFA213  No results found for: CA2729  No components found for: HGQUANT  No results found for: CEA1 / No results found for: CEA1   No results found for: AFPTUMOR  No results found for: CHROMOGRNA  No results found for: KPAFRELGTCHN, LAMBDASER, KAPLAMBRATIO (kappa/lambda light chains)  No results found for: HGBA, HGBA2QUANT, HGBFQUANT, HGBSQUAN (Hemoglobinopathy evaluation)   No results found for: LDH  No results found for: IRON, TIBC, IRONPCTSAT (Iron and TIBC)  No results found for: FERRITIN  Urinalysis No results found for: COLORURINE, APPEARANCEUR, LABSPEC, PHURINE, GLUCOSEU, HGBUR, BILIRUBINUR, KETONESUR, PROTEINUR, UROBILINOGEN, NITRITE, LEUKOCYTESUR   STUDIES: No results found.   ELIGIBLE FOR AVAILABLE RESEARCH PROTOCOL:AET  ASSESSMENT: 69 y.o.  [place], [place] woman status post left breast overlapping sites biopsy 10/06/2019 for a clinical TXN0, stage IA invasive lobular carcinoma, grade 1, E-cadherin negative, estrogen and progesterone receptor positive, HER-2 not amplified, with an MIB-1 of 5-7%.  (a) breast MRI 11/07/2019 showed a 0.7 cm upper outer quadrant mass in the left breast with no other evidence of disease  (1) genetics testing 04/23/2021on the [facility] Breast Cancer STAT Panel + Common Hereditary Cancers Panel found no deleterious mutations in ATM, BRCA1, BRCA2, CDH1, CHEK2, PALB2, PTEN, STK11 and TP53, APC, ATM, AXIN2, BARD1, BMPR1A, BRCA1, BRCA2, BRIP1, CDH1, CDKN2A (p14ARF), CDKN2A (p16INK4a), CKD4, CHEK2, CTNNA1, DICER1, EPCAM  (Deletion/duplication testing only), GREM1 (promoter region deletion/duplication testing only), KIT, MEN1, MLH1, MSH2, MSH3, MSH6, MUTYH, NBN, NF1, NHTL1, PALB2, PDGFRA, PMS2, POLD1, POLE, PTEN, RAD50, RAD51C, RAD51D, RNF43, SDHB, SDHC, SDHD, SMAD4, SMARCA4. STK11, TP53, TSC1, TSC2, and VHL.  The following genes were evaluated for sequence changes only: SDHA and HOXB13 c.251G>A variant only.  (2) status post left lumpectomy and sentinel lymph node sampling 11/23/2019 for a pT1b pN0, stage IA invasive lobular carcinoma, with negative margins  (a) 1 sentinel lymph node removed  (3) Oncotype score of 18 predicts a risk of recurrence outside the breast in the next 9 years of 5% if the patient's only systemic therapy is an antiestrogen for 5 years.  It predicts no benefit from chemotherapy.  (4) adjuvant radiation starting 01/02/2020  (5) to start anastrozole 02/26/2020  (a) bone density scan October 2021   PLAN: Denea did well with her surgery and is recovering uneventfully.  She is concerned about the possible seroma in her breast.  She understands that this can be reabsorbed although sometimes it organizes and becomes more like a permanent scar.  She is starting radiation tomorrow.  She will have a total of 4weeks.  We discussed some of the possible complications of that and specifically the benefit of the breath-holding technique which is available here  After that she will be off a couple of weeks and then start anastrozole.  She has a good understanding of the possible toxicities side effects and complications of that as opposed to tamoxifen and we are staying away from tamoxifen chiefly because of concerns regarding clots even though she is on NOAC's.  I am adding a bone density to her next mammogram which will be in October.  I will then see her in November.  She knows to call for any other issue that may develop before the next visit  Total encounter time 30 minutes.Sarajane Jews C.  Ashle Stief, MD 01/01/2020 3:14 PM Medical Oncology and Hematology [facility] [place], [place] [address] Tel. [phone]    Fax. [phone]   This document serves as a record of services personally performed by Lurline Del, MD. It was created on his behalf by Wilburn Mylar, a trained medical scribe. The creation of this record is based on the scribe's personal observations and the provider's statements to them.   I, Lurline Del MD, have reviewed the above documentation for accuracy and completeness, and I agree with the above.   *Total Encounter Time as defined by the Centers for Medicare and Medicaid Services includes, in addition to the face-to-face time of a patient visit (documented in the note above) non-face-to-face time: obtaining and reviewing outside history, ordering and reviewing medications, tests or procedures, care coordination (communications with other health care professionals or caregivers) and documentation in the medical record.

## 2020-01-01 ENCOUNTER — Other Ambulatory Visit: Payer: Self-pay

## 2020-01-01 ENCOUNTER — Inpatient Hospital Stay: Payer: Medicare Other | Attending: Oncology | Admitting: Oncology

## 2020-01-01 ENCOUNTER — Telehealth: Payer: Self-pay | Admitting: Oncology

## 2020-01-01 VITALS — BP 120/55 | HR 61 | Temp 98.3°F | Resp 18 | Ht 64.0 in | Wt 247.9 lb

## 2020-01-01 DIAGNOSIS — Z6841 Body Mass Index (BMI) 40.0 and over, adult: Secondary | ICD-10-CM

## 2020-01-01 DIAGNOSIS — Z808 Family history of malignant neoplasm of other organs or systems: Secondary | ICD-10-CM | POA: Insufficient documentation

## 2020-01-01 DIAGNOSIS — C50412 Malignant neoplasm of upper-outer quadrant of left female breast: Secondary | ICD-10-CM

## 2020-01-01 DIAGNOSIS — Z800 Family history of malignant neoplasm of digestive organs: Secondary | ICD-10-CM | POA: Diagnosis not present

## 2020-01-01 DIAGNOSIS — Z803 Family history of malignant neoplasm of breast: Secondary | ICD-10-CM | POA: Insufficient documentation

## 2020-01-01 DIAGNOSIS — Z90710 Acquired absence of both cervix and uterus: Secondary | ICD-10-CM | POA: Diagnosis not present

## 2020-01-01 DIAGNOSIS — C50812 Malignant neoplasm of overlapping sites of left female breast: Secondary | ICD-10-CM | POA: Insufficient documentation

## 2020-01-01 DIAGNOSIS — Z170 Estrogen receptor positive status [ER+]: Secondary | ICD-10-CM

## 2020-01-01 NOTE — Telephone Encounter (Signed)
Scheduled appts per 6/21 los. Gave pt a print out of AVS.  

## 2020-01-02 ENCOUNTER — Other Ambulatory Visit: Payer: Self-pay

## 2020-01-02 ENCOUNTER — Ambulatory Visit
Admission: RE | Admit: 2020-01-02 | Discharge: 2020-01-02 | Disposition: A | Discharge status: Home / Self Care | Payer: Medicare Other | Source: Ambulatory Visit | Attending: Radiation Oncology | Admitting: Radiation Oncology

## 2020-01-02 DIAGNOSIS — Z510 Encounter for antineoplastic radiation therapy: Secondary | ICD-10-CM | POA: Diagnosis not present

## 2020-01-03 ENCOUNTER — Ambulatory Visit
Admission: RE | Admit: 2020-01-03 | Discharge: 2020-01-03 | Disposition: A | Discharge status: Home / Self Care | Payer: Medicare Other | Source: Ambulatory Visit | Attending: Radiation Oncology | Admitting: Radiation Oncology

## 2020-01-03 ENCOUNTER — Other Ambulatory Visit: Payer: Self-pay

## 2020-01-03 DIAGNOSIS — Z510 Encounter for antineoplastic radiation therapy: Secondary | ICD-10-CM | POA: Diagnosis not present

## 2020-01-04 ENCOUNTER — Ambulatory Visit
Admission: RE | Admit: 2020-01-04 | Discharge: 2020-01-04 | Disposition: A | Discharge status: Home / Self Care | Payer: Medicare Other | Source: Ambulatory Visit | Attending: Radiation Oncology | Admitting: Radiation Oncology

## 2020-01-04 DIAGNOSIS — Z510 Encounter for antineoplastic radiation therapy: Secondary | ICD-10-CM | POA: Diagnosis not present

## 2020-01-05 ENCOUNTER — Other Ambulatory Visit: Payer: Self-pay

## 2020-01-05 ENCOUNTER — Ambulatory Visit
Admission: RE | Admit: 2020-01-05 | Discharge: 2020-01-05 | Disposition: A | Discharge status: Home / Self Care | Payer: Medicare Other | Source: Ambulatory Visit | Attending: Radiation Oncology | Admitting: Radiation Oncology

## 2020-01-05 DIAGNOSIS — Z510 Encounter for antineoplastic radiation therapy: Secondary | ICD-10-CM | POA: Diagnosis not present

## 2020-01-08 ENCOUNTER — Ambulatory Visit
Admission: RE | Admit: 2020-01-08 | Discharge: 2020-01-08 | Disposition: A | Discharge status: Home / Self Care | Payer: Medicare Other | Source: Ambulatory Visit | Attending: Radiation Oncology | Admitting: Radiation Oncology

## 2020-01-08 ENCOUNTER — Ambulatory Visit: Payer: Medicare Other | Attending: Oncology | Admitting: Physical Therapy

## 2020-01-08 ENCOUNTER — Encounter: Payer: Self-pay | Admitting: Physical Therapy

## 2020-01-08 ENCOUNTER — Other Ambulatory Visit: Payer: Self-pay

## 2020-01-08 DIAGNOSIS — R293 Abnormal posture: Secondary | ICD-10-CM | POA: Diagnosis present

## 2020-01-08 DIAGNOSIS — C50912 Malignant neoplasm of unspecified site of left female breast: Secondary | ICD-10-CM | POA: Diagnosis present

## 2020-01-08 DIAGNOSIS — Z170 Estrogen receptor positive status [ER+]: Secondary | ICD-10-CM

## 2020-01-08 DIAGNOSIS — Z510 Encounter for antineoplastic radiation therapy: Secondary | ICD-10-CM | POA: Diagnosis not present

## 2020-01-08 DIAGNOSIS — R600 Localized edema: Secondary | ICD-10-CM | POA: Insufficient documentation

## 2020-01-08 MED ORDER — SONAFINE EX EMUL
1.0000 "application " | Freq: Two times a day (BID) | CUTANEOUS | Status: DC
  Administered 2020-01-08: 1 via TOPICAL

## 2020-01-08 MED ORDER — ALRA NON-METALLIC DEODORANT (RAD-ONC)
1.0000 "application " | Freq: Once | TOPICAL | Status: AC
  Administered 2020-01-08: 1 via TOPICAL

## 2020-01-08 NOTE — Therapy (Signed)
[place], [place], [address] Phone: [phone]   Fax:  [phone]  Physical Therapy Treatment  Patient Details  Name: Lisa Cardenas MRN: [id number] Date of Birth: 02-04-1951 Referring Provider (PT): [facility]   Encounter Date: 01/08/2020   PT End of Session - 01/08/20 1352    Visit Number 2    Number of Visits 6    Date for PT Re-Evaluation 02/05/20    PT Start Time 1301    PT Stop Time 1345    PT Time Calculation (min) 44 min    Activity Tolerance Patient tolerated treatment well    Behavior During Therapy [facility] for tasks assessed/performed           Past Medical History:  Diagnosis Date  . A-fib [facility])    no issues since May 2020  . Arrhythmia   . Diverticulosis   . Elevated cholesterol   . Family history of breast cancer   . Family history of melanoma   . Family history of pancreatic cancer   . Family history of prostate cancer   . Hypertension   . Obesity   . Pancreatitis   . Thyroid disease   . Trigeminal neuralgia   . Trigeminal neuralgia   . UTI (urinary tract infection)     Past Surgical History:  Procedure Laterality Date  . ABDOMINAL HYSTERECTOMY  1980  . BLADDER REPAIR    . BREAST LUMPECTOMY WITH RADIOACTIVE SEED AND SENTINEL LYMPH NODE BIOPSY Left 11/23/2019   Procedure: LEFT BREAST LUMPECTOMY WITH RADIOACTIVE SEED AND LEFT AXILLARY SENTINEL LYMPH NODE BIOPSY;  Surgeon: Rolm Bookbinder, MD;  Location: [facility];  Service: General;  Laterality: Left;  PEC BLOCK  . COLONOSCOPY     [facility]  . [facility]OPHAGOGASTRODUODENOSCOPY     [facility]  . KNEE ARTHROSCOPY Left   . TONSILLECTOMY      There were no vitals filed for this visit.   Subjective Assessment - 01/08/20 1305    Subjective I feel like I am doing really well. I have a seroma in my left breast. I have some numbness in my left armpit.    Pertinent History Left lumpectomy with SLNB 11/23/19,  with Dr. Donne Hazel  most likely no chemotherapy and will have radiation. 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   Other history: A-fib, high cholesterol ,HTN, trigeminal neuralgia, essential tremors    Patient Stated Goals learn from all providers    Currently in Pain? No/denies    Pain Score 0-No pain              [facility] PT Assessment - 01/08/20 0001      Assessment   Medical Diagnosis Left breast cancer    Referring Provider (PT) [facility]    Onset Date/Surgical Date 11/23/19    Hand Dominance Right    Prior Therapy eval for baseline in April 2021 at this clinic      Precautions   Precautions Other (comment)    Precaution Comments risk of lymphedema      Restrictions   Weight Bearing Restrictions No      Balance Screen   Has the patient fallen in the past 6 months No    Has the patient had a decrease in activity level because of a fear of falling?  No    Is the patient reluctant to leave their home because of a fear of falling?  No      Posture/Postural Control  Posture/Postural Control Postural limitations    Postural Limitations Rounded Shoulders;Forward head;Increased thoracic kyphosis      AROM   Left Shoulder Extension 82 Degrees    Left Shoulder Flexion 165 Degrees    Left Shoulder ABduction 158 Degrees    Left Shoulder Internal Rotation 68 Degrees    Left Shoulder External Rotation 84 Degrees             LYMPHEDEMA/ONCOLOGY QUESTIONNAIRE - 01/08/20 0001      Left Upper Extremity Lymphedema   15 cm Proximal to Olecranon Process 41 cm    10 cm Proximal to Olecranon Process 38.2 cm    Olecranon Process 28.5 cm    10 cm Proximal to Ulnar Styloid Process 23.1 cm    Just Proximal to Ulnar Styloid Process 16.6 cm    Across Hand at [employer] 19.9 cm    At [place] of 2nd Digit 6.5 cm                      [facility] Adult PT Treatment/Exercise - 01/08/20 0001      Manual Therapy   Manual Therapy Edema management    Edema Management created foam chip  pack for pt to wear in a sports bra for additional compression                  PT Education - 01/08/20 1400    Education Details wear sports bra with chip pack for compression, lymphedema vs seroma, purpose of compression sleeve    Person(s) Educated Patient    Methods Explanation    Comprehension Verbalized understanding               PT Long Term Goals - 01/08/20 1351      PT LONG TERM GOAL #1   Title Pt will receive compression sleeve for prophylactic use when she flies    Time 4    Period Weeks    Status New    Target Date 02/05/20      PT LONG TERM GOAL #2   Title Pt will be independent in self MLD for L breast to help decrease seroma    Time 4    Period Weeks    Status New    Target Date 02/05/20                 Plan - 01/08/20 1354    Clinical Impression Statement Pt's presents to PT following a lumpectomy and SLNB on 11/23/19. She only had 1 node biopsied and it was negative. Discused with pt that she has a very low risk of developing lymphedema. Issued an rx for a compression sleeve for prophylactic use when she flies. ROM measurements taken today are [facility] and are nearly equivalent to baseline. She does have a seroma in her left breast which is hard. Created foam chip pack for pt to wear in her bra to help with this. Educated pt to wear a sports bra that provides compression in addition to chip pack to help decrease seroma. Circumferential measurements are nearly the same as baseline except for 10 cm proximal to the olecranon which did increase slightly. Will monitor this at future appointments. Pt would benefit from skilled PT services to decrease left breast swelling, assist pt with obtaining appropriate compression garments and instruct pt in self MLD for seroma.    Rehab Potential Good    PT Frequency 1x / week    PT Duration 4 weeks  PT Treatment/Interventions ADLs/Self Care Home Management;Patient/family education;Therapeutic exercise;Manual  techniques;Manual lymph drainage;Passive range of motion;Scar mobilization    PT Next Visit Plan continue to measure circumferences of UEs and monitor for swelling, see if pt got compression sleeve (rx issued today), instruct in MLD for L breast and arm and issue handout, MLD for seroma L breast    PT Home Exercise Plan wear sports bra with chip pack    Consulted and Agree with Plan of Care Patient           Patient will benefit from skilled therapeutic intervention in order to improve the following deficits and impairments:  Postural dysfunction, Increased edema, Decreased knowledge of precautions  Visit Diagnosis: Localized edema  Abnormal posture  Malignant neoplasm of left breast in female, estrogen receptor positive, unspecified site of breast [facility])     Problem List Patient Active Problem List   Diagnosis Date Noted  . Genetic testing 11/07/2019  . Malignant neoplasm of upper-outer quadrant of left breast in female, estrogen receptor positive ([place]) 10/26/2019  . Atrial fibrillation ([place]) 10/26/2019  . Chronic anticoagulation 10/26/2019  . Morbid obesity with BMI of 40.0-44.9, adult ([employer]) 10/26/2019  . Family history of breast cancer   . Family history of pancreatic cancer   . Family history of prostate cancer   . Family history of melanoma     Manus Gunning 01/08/2020, 2:02 PM  [place], [place], [address] Phone: [phone]   Fax:  [phone]  Name: Lisa Cardenas MRN: [id number] Date of Birth: Jul 26, 1950  Manus Gunning, PT 01/08/20 2:02 PM

## 2020-01-09 ENCOUNTER — Ambulatory Visit
Admission: RE | Admit: 2020-01-09 | Discharge: 2020-01-09 | Disposition: A | Discharge status: Home / Self Care | Payer: Medicare Other | Source: Ambulatory Visit | Attending: Radiation Oncology | Admitting: Radiation Oncology

## 2020-01-09 ENCOUNTER — Other Ambulatory Visit: Payer: Self-pay

## 2020-01-09 DIAGNOSIS — Z510 Encounter for antineoplastic radiation therapy: Secondary | ICD-10-CM | POA: Diagnosis not present

## 2020-01-10 ENCOUNTER — Other Ambulatory Visit: Payer: Self-pay

## 2020-01-10 ENCOUNTER — Ambulatory Visit (HOSPITAL_COMMUNITY)
Admission: EM | Admit: 2020-01-10 | Discharge: 2020-01-10 | Disposition: A | Discharge status: Home / Self Care | Payer: Medicare Other | Attending: Family Medicine | Admitting: Family Medicine

## 2020-01-10 ENCOUNTER — Encounter (HOSPITAL_COMMUNITY): Payer: Self-pay

## 2020-01-10 ENCOUNTER — Ambulatory Visit
Admission: RE | Admit: 2020-01-10 | Discharge: 2020-01-10 | Disposition: A | Discharge status: Home / Self Care | Payer: Medicare Other | Source: Ambulatory Visit | Attending: Radiation Oncology | Admitting: Radiation Oncology

## 2020-01-10 ENCOUNTER — Encounter: Payer: Self-pay | Admitting: Oncology

## 2020-01-10 DIAGNOSIS — L0231 Cutaneous abscess of buttock: Secondary | ICD-10-CM | POA: Diagnosis not present

## 2020-01-10 DIAGNOSIS — Z510 Encounter for antineoplastic radiation therapy: Secondary | ICD-10-CM | POA: Diagnosis not present

## 2020-01-10 HISTORY — DX: Malignant (primary) neoplasm, unspecified: C80.1

## 2020-01-10 MED ORDER — DOXYCYCLINE HYCLATE 100 MG PO CAPS
100.0000 mg | ORAL_CAPSULE | Freq: Two times a day (BID) | ORAL | 0 refills | Status: AC
Start: 2020-01-10 — End: 2020-01-15

## 2020-01-10 NOTE — Discharge Instructions (Signed)
Continue doxycycline, I refilled 5 more days  Packing to be removed in 24-46 hours  Apply warm compresses/hot rags to area with massage to express further drainage especially the first 24-48 hours  Return if symptoms returning or not improving

## 2020-01-10 NOTE — ED Triage Notes (Signed)
Pt has abscess in perianal areax5 days. PT started doxycycline yesterday, pcp started her on it.

## 2020-01-11 ENCOUNTER — Ambulatory Visit (HOSPITAL_COMMUNITY)
Admission: EM | Admit: 2020-01-11 | Discharge: 2020-01-11 | Disposition: A | Discharge status: Home / Self Care | Payer: Medicare Other

## 2020-01-11 ENCOUNTER — Ambulatory Visit
Admission: RE | Admit: 2020-01-11 | Discharge: 2020-01-11 | Disposition: A | Discharge status: Home / Self Care | Payer: Medicare Other | Source: Ambulatory Visit | Attending: Radiation Oncology | Admitting: Radiation Oncology

## 2020-01-11 ENCOUNTER — Other Ambulatory Visit: Payer: Self-pay

## 2020-01-11 DIAGNOSIS — Z170 Estrogen receptor positive status [ER+]: Secondary | ICD-10-CM | POA: Insufficient documentation

## 2020-01-11 DIAGNOSIS — Z510 Encounter for antineoplastic radiation therapy: Secondary | ICD-10-CM | POA: Diagnosis present

## 2020-01-11 DIAGNOSIS — C50412 Malignant neoplasm of upper-outer quadrant of left female breast: Secondary | ICD-10-CM | POA: Insufficient documentation

## 2020-01-11 DIAGNOSIS — Z5189 Encounter for other specified aftercare: Secondary | ICD-10-CM

## 2020-01-11 NOTE — ED Provider Notes (Signed)
[facility]    CSN: [id number] Arrival date & time: 01/11/20  1240      History   Chief Complaint Chief Complaint  Patient presents with   Wound Check    HPI ZOA DOWTY is a 69 y.o. female presenting today for wound check of abscess.  Patient was here yesterday and had I&D of left gluteal abscess.  Packing was placed, but fell out at home.  HPI  Past Medical History:  Diagnosis Date   A-fib [facility])    no issues since May 2020   Arrhythmia    Cancer ([place])    Diverticulosis    Elevated cholesterol    Family history of breast cancer    Family history of melanoma    Family history of pancreatic cancer    Family history of prostate cancer    Hypertension    Obesity    Pancreatitis    Thyroid disease    Trigeminal neuralgia    Trigeminal neuralgia    UTI (urinary tract infection)     Patient Active Problem List   Diagnosis Date Noted   Genetic testing 11/07/2019   Malignant neoplasm of upper-outer quadrant of left breast in female, estrogen receptor positive ([place]) 10/26/2019   Atrial fibrillation ([place]) 10/26/2019   Chronic anticoagulation 10/26/2019   Morbid obesity with BMI of 40.0-44.9, adult ([place]) 10/26/2019   Family history of breast cancer    Family history of pancreatic cancer    Family history of prostate cancer    Family history of melanoma     Past Surgical History:  Procedure Laterality Date   ABDOMINAL HYSTERECTOMY  1980   BLADDER REPAIR     BREAST LUMPECTOMY WITH RADIOACTIVE SEED AND SENTINEL LYMPH NODE BIOPSY Left 11/23/2019   Procedure: LEFT BREAST LUMPECTOMY WITH RADIOACTIVE SEED AND LEFT AXILLARY SENTINEL LYMPH NODE BIOPSY;  Surgeon: Rolm Bookbinder, MD;  Location: [facility];  Service: General;  Laterality: Left;  PEC BLOCK   COLONOSCOPY     [facility]   [facility]OPHAGOGASTRODUODENOSCOPY     [facility]   KNEE ARTHROSCOPY Left    TONSILLECTOMY      OB History   No obstetric history on file.       Home Medications    Prior to Admission medications   Medication Sig Start Date End Date Taking? Authorizing Provider  carbamazepine (TEGRETOL) 200 MG tablet Take 200 mg by mouth daily with breakfast.     [provider]  celecoxib (CELEBREX) 200 MG capsule Take 200 mg by mouth every 30 (thirty) days. 10/09/19   [provider]  doxycycline (VIBRAMYCIN) 100 MG capsule Take 1 capsule (100 mg total) by mouth 2 (two) times daily for 5 days. 01/10/20 01/15/20  Dartanian Knaggs C, PA-C  esomeprazole (NEXIUM) 40 MG capsule Take 40 mg by mouth 2 (two) times daily as needed.    [provider]  gabapentin (NEURONTIN) 300 MG capsule Take 300 mg by mouth 2 (two) times daily. One in late morning and one at night    [provider]  levothyroxine (SYNTHROID) 137 MCG tablet Take 137 mcg by mouth daily. Current dose 137 mcg daily    [provider]  LORazepam (ATIVAN) 0.5 MG tablet Take 0.5 mg by mouth as needed for anxiety.    [provider]  metoprolol succinate (TOPROL-XL) 100 MG 24 hr tablet Take 100 mg by mouth 2 (two) times daily.    [provider]  olmesartan-hydrochlorothiazide (BENICAR HCT) 20-12.5 MG tablet  Take 1 tablet by mouth daily.    [provider]  primidone (MYSOLINE) 50 MG tablet Take 50 mg by mouth daily.     [provider]  rivaroxaban (XARELTO) 20 MG TABS tablet Take 20 mg by mouth daily.    [provider]  sertraline (ZOLOFT) 100 MG tablet Take 50 mg by mouth daily. 11/14/19   [provider]  sertraline (ZOLOFT) 50 MG tablet Take 50 mg by mouth daily.    [provider]  simvastatin (ZOCOR) 40 MG tablet Take 40 mg by mouth daily.    [provider]  traMADol (ULTRAM) 50 MG tablet Take 1 tablet (50 mg total) by mouth every 6 (six) hours as needed. 11/23/19   Rolm Bookbinder, MD  Vitamin D, Ergocalciferol, (DRISDOL) 1.25 MG (50000 UNIT) CAPS capsule Take 50,000 Units  by mouth once a week. 09/06/19   [provider]    Family History Family History  Problem Relation Age of Onset   Colon cancer Mother        dx 79   Breast cancer Maternal Grandmother    Breast cancer Maternal Aunt    Prostate cancer Maternal Uncle    Breast cancer Paternal Aunt    Melanoma Paternal Uncle    Breast cancer Maternal Aunt    Leukemia Maternal Uncle    Breast cancer Paternal Aunt    Colon cancer Paternal Aunt    Melanoma Daughter     Social History Social History   Tobacco Use   Smoking status: Never Smoker   Smokeless tobacco: Never Used  Scientific laboratory technician Use: Never used  Substance Use Topics   Alcohol use: Not Currently   Drug use: Not Currently     Allergies   Plaquenil [hydroxychloroquine]   Review of Systems Review of Systems  Constitutional: Negative for fatigue and fever.  HENT: Negative for mouth sores.   Eyes: Negative for visual disturbance.  Respiratory: Negative for shortness of breath.   Cardiovascular: Negative for chest pain.  Gastrointestinal: Negative for abdominal pain, nausea and vomiting.  Genitourinary: Negative for genital sores.  Musculoskeletal: Negative for arthralgias and joint swelling.  Skin: Positive for color change and wound. Negative for rash.  Neurological: Negative for dizziness, weakness, light-headedness and headaches.     Physical Exam Triage Vital Signs ED Triage Vitals  Enc Vitals Group     BP 01/11/20 1250 (!) 144/84     Pulse Rate 01/11/20 1250 (!) 58     Resp 01/11/20 1250 16     Temp 01/11/20 1250 98.1 F (36.7 C)     Temp src --      SpO2 01/11/20 1250 96 %     Weight --      Height --      Head Circumference --      Peak Flow --      Pain Score 01/11/20 1252 5     Pain Loc --      Pain Edu? --      Excl. in [place]? --    No data found.  Updated Vital Signs BP (!) 144/84 (BP Location: Right Arm)    Pulse (!) 58    Temp 98.1 F (36.7 C)    Resp 16    SpO2 96%    Visual Acuity Right Eye Distance:   Left Eye Distance:   Bilateral Distance:    Right Eye Near:   Left Eye Near:    Bilateral Near:  Physical Exam Vitals and nursing note reviewed.  Constitutional:      Appearance: She is well-developed.     Comments: No acute distress  HENT:     Head: Normocephalic and atraumatic.     Nose: Nose normal.  Eyes:     Conjunctiva/sclera: Conjunctivae normal.  Cardiovascular:     Rate and Rhythm: Normal rate.  Pulmonary:     Effort: Pulmonary effort is normal. No respiratory distress.  Abdominal:     General: There is no distension.  Genitourinary:    Comments: Left gluteal area with continued area of mild erythema and induration, area feels slightly less indurated compared to yesterday, but symptoms still present, small amount of additional pus expressed from incision, minimal bleeding Musculoskeletal:        General: Normal range of motion.     Cervical back: Neck supple.  Skin:    General: Skin is warm and dry.  Neurological:     Mental Status: She is alert and oriented to person, place, and time.      UC Treatments / Results  Labs (all labs ordered are listed, but only abnormal results are displayed) Labs Reviewed - No data to display  EKG   Radiology No results found.  Procedures Procedures (including critical care time)  Medications Ordered in UC Medications - No data to display  Initial Impression / Assessment and Plan / UC Course  I have reviewed the triage vital signs and the nursing notes.  Pertinent labs & imaging results that were available during my care of the patient were reviewed by me and considered in my medical decision making (see chart for details).     Area does appear slightly less erythematous and less indurated compared to yesterday, but still persistent, continue doxy, warm compresses and monitor for continued improvement.  Discussed strict return precautions. Patient verbalized  understanding and is agreeable with plan.  Final Clinical Impressions(s) / UC Diagnoses   Final diagnoses:  Wound check, abscess     Discharge Instructions     Continue warm compresses, gentle massage Finish Course of doxycycline Follow up for any concerns   ED Prescriptions    None     PDMP not reviewed this encounter.   Janith Lima, PA-C 01/11/20 1529

## 2020-01-11 NOTE — ED Triage Notes (Signed)
Pt presents to UC for follow up after IND to left perineal area yesterday. Pt states she was told "she could come back have it checked". Pt states no worsening symptoms.

## 2020-01-11 NOTE — Discharge Instructions (Signed)
Continue warm compresses, gentle massage Finish Course of doxycycline Follow up for any concerns

## 2020-01-11 NOTE — ED Provider Notes (Signed)
[place]    CSN: [id number] Arrival date & time: 01/10/20  1458      History   Chief Complaint Chief Complaint  Patient presents with   Abscess    HPI Lisa Cardenas is a 69 y.o. female history of A. fib on Xarelto, hypertension, presenting today for evaluation of an abscess.  Patient reports that she has had an abscess to her left gluteal area for approximately 4-5 days.  She was seen by PCP yesterday and had CBC obtained along with placed on doxycycline.  She is here for follow-up today.  Continues to have pain does not seen much improvement after taking 3 doses of doxycycline.  She denies significant history of abscesses.  Denies fevers.  Denies rectal pain or pain with bowel movements.  HPI  Past Medical History:  Diagnosis Date   A-fib [facility])    no issues since May 2020   Arrhythmia    Cancer ([place])    Diverticulosis    Elevated cholesterol    Family history of breast cancer    Family history of melanoma    Family history of pancreatic cancer    Family history of prostate cancer    Hypertension    Obesity    Pancreatitis    Thyroid disease    Trigeminal neuralgia    Trigeminal neuralgia    UTI (urinary tract infection)     Patient Active Problem List   Diagnosis Date Noted   Genetic testing 11/07/2019   Malignant neoplasm of upper-outer quadrant of left breast in female, estrogen receptor positive ([place]) 10/26/2019   Atrial fibrillation ([place]) 10/26/2019   Chronic anticoagulation 10/26/2019   Morbid obesity with BMI of 40.0-44.9, adult ([place]) 10/26/2019   Family history of breast cancer    Family history of pancreatic cancer    Family history of prostate cancer    Family history of melanoma     Past Surgical History:  Procedure Laterality Date   ABDOMINAL HYSTERECTOMY  1980   BLADDER REPAIR     BREAST LUMPECTOMY WITH RADIOACTIVE SEED AND SENTINEL LYMPH NODE BIOPSY Left 11/23/2019   Procedure: LEFT BREAST LUMPECTOMY  WITH RADIOACTIVE SEED AND LEFT AXILLARY SENTINEL LYMPH NODE BIOPSY;  Surgeon: Rolm Bookbinder, MD;  Location: [place];  Service: General;  Laterality: Left;  PEC BLOCK   [facility]     [facility]   [facility]     [facility]   KNEE ARTHROSCOPY Left    TONSILLECTOMY      OB History   No obstetric history on file.      Home Medications    Prior to Admission medications   Medication Sig Start Date End Date Taking? Authorizing Provider  carbamazepine (TEGRETOL) 200 MG tablet Take 200 mg by mouth daily with breakfast.     [provider]  celecoxib (CELEBREX) 200 MG capsule Take 200 mg by mouth every 30 (thirty) days. 10/09/19   [provider]  doxycycline (VIBRAMYCIN) 100 MG capsule Take 1 capsule (100 mg total) by mouth 2 (two) times daily for 5 days. 01/10/20 01/15/20  Rodger Giangregorio C, PA-C  esomeprazole (NEXIUM) 40 MG capsule Take 40 mg by mouth 2 (two) times daily as needed.    [provider]  gabapentin (NEURONTIN) 300 MG capsule Take 300 mg by mouth 2 (two) times daily. One in late morning and one at night    [provider]  levothyroxine (SYNTHROID) 137 MCG tablet Take 137 mcg by mouth daily. Current dose  137 mcg daily    [provider]  LORazepam (ATIVAN) 0.5 MG tablet Take 0.5 mg by mouth as needed for anxiety.    [provider]  metoprolol succinate (TOPROL-XL) 100 MG 24 hr tablet Take 100 mg by mouth 2 (two) times daily.    [provider]  olmesartan-hydrochlorothiazide (BENICAR HCT) 20-12.5 MG tablet Take 1 tablet by mouth daily.    [provider]  primidone (MYSOLINE) 50 MG tablet Take 50 mg by mouth daily.     [provider]  rivaroxaban (XARELTO) 20 MG TABS tablet Take 20 mg by mouth daily.    [provider]  sertraline (ZOLOFT) 100 MG tablet Take 50 mg by mouth daily. 11/14/19   [provider]  sertraline (ZOLOFT) 50 MG tablet Take 50 mg by mouth  daily.    [provider]  simvastatin (ZOCOR) 40 MG tablet Take 40 mg by mouth daily.    [provider]  traMADol (ULTRAM) 50 MG tablet Take 1 tablet (50 mg total) by mouth every 6 (six) hours as needed. 11/23/19   Rolm Bookbinder, MD  Vitamin D, Ergocalciferol, (DRISDOL) 1.25 MG (50000 UNIT) CAPS capsule Take 50,000 Units by mouth once a week. 09/06/19   [provider]    Family History Family History  Problem Relation Age of Onset   Colon cancer Mother        dx 14   Breast cancer Maternal Grandmother    Breast cancer Maternal Aunt    Prostate cancer Maternal Uncle    Breast cancer Paternal Aunt    Melanoma Paternal Uncle    Breast cancer Maternal Aunt    Leukemia Maternal Uncle    Breast cancer Paternal Aunt    Colon cancer Paternal Aunt    Melanoma Daughter     Social History Social History   Tobacco Use   Smoking status: Never Smoker   Smokeless tobacco: Never Used  Scientific laboratory technician Use: Never used  Substance Use Topics   Alcohol use: Not Currently   Drug use: Not Currently     Allergies   Plaquenil [hydroxychloroquine]   Review of Systems Review of Systems  Constitutional: Negative for fatigue and fever.  Eyes: Negative for visual disturbance.  Respiratory: Negative for shortness of breath.   Cardiovascular: Negative for chest pain.  Gastrointestinal: Negative for abdominal pain, nausea and vomiting.  Musculoskeletal: Negative for arthralgias and joint swelling.  Skin: Positive for color change. Negative for rash and wound.  Neurological: Negative for dizziness, weakness, light-headedness and headaches.     Physical Exam Triage Vital Signs ED Triage Vitals  Enc Vitals Group     BP 01/10/20 1633 (!) 156/73     Pulse Rate 01/10/20 1633 62     Resp 01/10/20 1633 16     Temp 01/10/20 1633 98.1 F (36.7 C)     Temp Source 01/10/20 1633 Oral     SpO2 01/10/20 1633 94 %     Weight 01/10/20 1635 240 lb  (108.9 kg)     Height 01/10/20 1635 5\' 4"  (1.626 m)     Head Circumference --      Peak Flow --      Pain Score 01/10/20 1634 5     Pain Loc --      Pain Edu? --      Excl. in [place]? --    No data found.  Updated Vital Signs BP (!) 156/73    Pulse 62  Temp 98.1 F (36.7 C) (Oral)    Resp 16    Ht 5\' 4"  (1.626 m)    Wt 240 lb (108.9 kg)    SpO2 94%    BMI 41.20 kg/m   Visual Acuity Right Eye Distance:   Left Eye Distance:   Bilateral Distance:    Right Eye Near:   Left Eye Near:    Bilateral Near:     Physical Exam Vitals and nursing note reviewed.  Constitutional:      Appearance: She is well-developed.     Comments: No acute distress  HENT:     Head: Normocephalic and atraumatic.     Nose: Nose normal.  Eyes:     Conjunctiva/sclera: Conjunctivae normal.  Cardiovascular:     Rate and Rhythm: Normal rate.  Pulmonary:     Effort: Pulmonary effort is normal. No respiratory distress.  Abdominal:     General: There is no distension.  Genitourinary:    Comments: Left lower gluteal area with area of erythema and induration, does not extend to perirectal area, no perineal tenderness, central fluctuance with 2 punctate white lesions Musculoskeletal:        General: Normal range of motion.     Cervical back: Neck supple.  Skin:    General: Skin is warm and dry.  Neurological:     Mental Status: She is alert and oriented to person, place, and time.      UC Treatments / Results  Labs (all labs ordered are listed, but only abnormal results are displayed) Labs Reviewed - No data to display  EKG   Radiology No results found.  Procedures Incision and Drainage  Date/Time: 01/10/2020 4:30 PM Performed by: Graviel Payeur, Elesa Hacker, PA-C Authorized by: Vanessa Kick, MD   Consent:    Consent obtained:  Verbal   Consent given by:  Patient   Risks discussed:  Pain   Alternatives discussed:  Alternative treatment Location:    Type:  Abscess   Size:  4   Location:   Anogenital   Anogenital location:  Gluteal cleft Pre-procedure details:    Skin preparation:  Betadine Anesthesia (see MAR for exact dosages):    Anesthesia method:  Local infiltration   Local anesthetic:  Lidocaine 2% w/o epi Procedure type:    Complexity:  Simple Procedure details:    Needle aspiration: no     Incision types:  Single straight   Incision depth:  Subcutaneous   Scalpel blade:  11   Wound management:  Probed and deloculated   Drainage:  Bloody and purulent   Drainage amount:  Moderate   Wound treatment:  Wound left open   Packing materials:  1/4 in iodoform gauze Post-procedure details:    Patient tolerance of procedure:  Tolerated well, no immediate complications   (including critical care time)  Medications Ordered in UC Medications - No data to display  Initial Impression / Assessment and Plan / UC Course  I have reviewed the triage vital signs and the nursing notes.  Pertinent labs & imaging results that were available during my care of the patient were reviewed by me and considered in my medical decision making (see chart for details).     Small amount of packing placed, difficult to insert due to size of incision, limited to stab incision given patient on Xarelto and attempt to limit bleeding.  Continue doxycycline, warm compresses and gentle massage.  Follow-up for recheck if needed.  Discussed strict return precautions. Patient verbalized understanding  and is agreeable with plan.  Final Clinical Impressions(s) / UC Diagnoses   Final diagnoses:  Gluteal abscess     Discharge Instructions     Continue doxycycline, I refilled 5 more days  Packing to be removed in 24-46 hours  Apply warm compresses/hot rags to area with massage to express further drainage especially the first 24-48 hours  Return if symptoms returning or not improving    ED Prescriptions    Medication Sig Dispense Auth. Provider   doxycycline (VIBRAMYCIN) 100 MG capsule  Take 1 capsule (100 mg total) by mouth 2 (two) times daily for 5 days. 10 capsule Sherleen Pangborn, [place] C, PA-C     PDMP not reviewed this encounter.   Janith Lima, [place] 01/11/20 1631

## 2020-01-12 ENCOUNTER — Other Ambulatory Visit: Payer: Self-pay

## 2020-01-12 ENCOUNTER — Ambulatory Visit
Admission: RE | Admit: 2020-01-12 | Discharge: 2020-01-12 | Disposition: A | Discharge status: Home / Self Care | Payer: Medicare Other | Source: Ambulatory Visit | Attending: Radiation Oncology | Admitting: Radiation Oncology

## 2020-01-12 DIAGNOSIS — Z510 Encounter for antineoplastic radiation therapy: Secondary | ICD-10-CM | POA: Diagnosis not present

## 2020-01-16 ENCOUNTER — Other Ambulatory Visit: Payer: Self-pay | Admitting: Radiation Oncology

## 2020-01-16 ENCOUNTER — Ambulatory Visit: Payer: Medicare Other | Attending: Oncology

## 2020-01-16 ENCOUNTER — Ambulatory Visit
Admission: RE | Admit: 2020-01-16 | Discharge: 2020-01-16 | Disposition: A | Discharge status: Home / Self Care | Payer: Medicare Other | Source: Ambulatory Visit | Attending: Radiation Oncology | Admitting: Radiation Oncology

## 2020-01-16 DIAGNOSIS — R600 Localized edema: Secondary | ICD-10-CM | POA: Insufficient documentation

## 2020-01-16 DIAGNOSIS — Z510 Encounter for antineoplastic radiation therapy: Secondary | ICD-10-CM | POA: Diagnosis not present

## 2020-01-17 ENCOUNTER — Ambulatory Visit
Admission: RE | Admit: 2020-01-17 | Discharge: 2020-01-17 | Disposition: A | Discharge status: Home / Self Care | Payer: Medicare Other | Source: Ambulatory Visit | Attending: Radiation Oncology | Admitting: Radiation Oncology

## 2020-01-17 DIAGNOSIS — Z510 Encounter for antineoplastic radiation therapy: Secondary | ICD-10-CM | POA: Diagnosis not present

## 2020-01-18 ENCOUNTER — Ambulatory Visit
Admission: RE | Admit: 2020-01-18 | Discharge: 2020-01-18 | Disposition: A | Discharge status: Home / Self Care | Payer: Medicare Other | Source: Ambulatory Visit | Attending: Radiation Oncology | Admitting: Radiation Oncology

## 2020-01-18 ENCOUNTER — Other Ambulatory Visit: Payer: Self-pay

## 2020-01-18 DIAGNOSIS — Z510 Encounter for antineoplastic radiation therapy: Secondary | ICD-10-CM | POA: Diagnosis not present

## 2020-01-19 ENCOUNTER — Ambulatory Visit
Admission: RE | Admit: 2020-01-19 | Discharge: 2020-01-19 | Disposition: A | Discharge status: Home / Self Care | Payer: Medicare Other | Source: Ambulatory Visit | Attending: Radiation Oncology | Admitting: Radiation Oncology

## 2020-01-19 DIAGNOSIS — Z510 Encounter for antineoplastic radiation therapy: Secondary | ICD-10-CM | POA: Diagnosis not present

## 2020-01-20 ENCOUNTER — Ambulatory Visit: Payer: Medicare Other

## 2020-01-21 ENCOUNTER — Ambulatory Visit: Payer: Medicare Other

## 2020-01-22 ENCOUNTER — Ambulatory Visit
Admission: RE | Admit: 2020-01-22 | Discharge: 2020-01-22 | Disposition: A | Discharge status: Home / Self Care | Payer: Medicare Other | Source: Ambulatory Visit | Attending: Radiation Oncology | Admitting: Radiation Oncology

## 2020-01-22 ENCOUNTER — Other Ambulatory Visit: Payer: Self-pay

## 2020-01-22 DIAGNOSIS — Z510 Encounter for antineoplastic radiation therapy: Secondary | ICD-10-CM | POA: Diagnosis not present

## 2020-01-23 ENCOUNTER — Encounter: Payer: Self-pay | Admitting: Physical Therapy

## 2020-01-23 ENCOUNTER — Ambulatory Visit
Admission: RE | Admit: 2020-01-23 | Discharge: 2020-01-23 | Disposition: A | Discharge status: Home / Self Care | Payer: Medicare Other | Source: Ambulatory Visit | Attending: Radiation Oncology | Admitting: Radiation Oncology

## 2020-01-23 ENCOUNTER — Other Ambulatory Visit: Payer: Self-pay

## 2020-01-23 ENCOUNTER — Ambulatory Visit: Payer: Medicare Other | Admitting: Physical Therapy

## 2020-01-23 DIAGNOSIS — R600 Localized edema: Secondary | ICD-10-CM

## 2020-01-23 DIAGNOSIS — Z510 Encounter for antineoplastic radiation therapy: Secondary | ICD-10-CM | POA: Diagnosis not present

## 2020-01-23 NOTE — Patient Instructions (Signed)
Self manual lymph drainage: Perform this sequence once a day.  Only give enough pressure no your skin to make the skin move.  Diaphragmatic - Supine   Inhale through nose making navel move out toward hands. Exhale through puckered lips, hands follow navel in. Repeat _5__ times. Rest _10__ seconds between repeats.   Copyright  VHI. All rights reserved.  Hug yourself.  Do circles at your neck just above your collarbones.  Repeat this 10 times.  Axilla - One at a Time   Using full weight of flat hand and fingers at center of uninvolved armpit, make _10__ in-place circles.   Copyright  VHI. All rights reserved.  LEG: Inguinal Nodes Stimulation   With small finger side of hand against hip crease on involved side, gently perform circles at the crease. Repeat __10_ times.   Copyright  VHI. All rights reserved.  1) Axilla to Inguinal Nodes - Sweep   On involved side, sweep _4__ times from armpit along side of trunk to hip crease.  Now gently stretch skin from the involved side to the uninvolved side across the chest at the shoulder line.  Repeat that 4 times.  Draw an imaginary diagonal line from upper outer breast through the nipple area toward lower inner breast.  Direct fluid upward and inward from this line toward the pathway across your upper chest .  Do this in three rows to treat all of the upper inner breast tissue, and do each row 3-4x.      Direct fluid to treat all of lower outer breast tissue downward and outward toward  pathway that is aimed at the left groin. Copyright  VHI. All rights reserved.  Arm Posterior: Elbow to Shoulder - Sweep   Pump _5__ times from back of elbow to top of shoulder. Then inner to outer upper arm _5_ times, then outer arm again _5_ times. Then back to the pathways _2-3_ times. Do _1__ time per day.  Copyright  VHI. All rights reserved.  ARM: [place] Wrist to Elbow - Sweep   Pump or stationary circles _5__ times from wrist to elbow making  sure to do both sides of the forearm. Then retrace your steps to the outer arm, and the pathways _2-3_ times each. Do _1__ time per day.  Copyright  VHI. All rights reserved.  ARM: Dorsum of Hand to Shoulder - Sweep   Pump or stationary circles _5__ times on back of hand including knuckle spaces and individual fingers if needed working up towards the wrist, then retrace all your steps working back up the forearm, doing both sides; upper outer arm and back to your pathways _2-3_ times each. Then do 5 circles again at uninvolved armpit and involved groin where you started! Good job!! Do __1_ time per day.  Copyright  VHI. All rights reserved.

## 2020-01-23 NOTE — Therapy (Signed)
[place], [place], [address] Phone: [phone]   Fax:  [phone]  Physical Therapy Treatment  Patient Details  Name: Lisa Cardenas MRN: [id number] Date of Birth: 11-24-1950 Referring Provider (PT): [place]   Encounter Date: 01/23/2020   PT End of Session - 01/23/20 1400    Visit Number 3    Number of Visits 6    Date for PT Re-Evaluation 02/05/20    PT Start Time 3612    PT Stop Time 1402    PT Time Calculation (min) 57 min    Activity Tolerance Patient tolerated treatment well    Behavior During Therapy [facility] for tasks assessed/performed           Past Medical History:  Diagnosis Date  . A-fib [facility])    no issues since May 2020  . Arrhythmia   . Cancer ([place])   . Diverticulosis   . Elevated cholesterol   . Family history of breast cancer   . Family history of melanoma   . Family history of pancreatic cancer   . Family history of prostate cancer   . Hypertension   . Obesity   . Pancreatitis   . Thyroid disease   . Trigeminal neuralgia   . Trigeminal neuralgia   . UTI (urinary tract infection)     Past Surgical History:  Procedure Laterality Date  . ABDOMINAL HYSTERECTOMY  1980  . BLADDER REPAIR    . BREAST LUMPECTOMY WITH RADIOACTIVE SEED AND SENTINEL LYMPH NODE BIOPSY Left 11/23/2019   Procedure: LEFT BREAST LUMPECTOMY WITH RADIOACTIVE SEED AND LEFT AXILLARY SENTINEL LYMPH NODE BIOPSY;  Surgeon: Rolm Bookbinder, MD;  Location: [place];  Service: General;  Laterality: Left;  PEC BLOCK  . [facility]  . [facility]ODUODENOSCOPY     [facility]  . KNEE ARTHROSCOPY Left   . TONSILLECTOMY      There were no vitals filed for this visit.   Subjective Assessment - 01/23/20 1306    Subjective I go tomorrow for the sleeve. Tomorrow is my last radiation appointment.    Pertinent History Left lumpectomy with SLNB 11/23/19,  with Dr. Donne Hazel most likely no  chemotherapy and will have radiation. 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   Other history: A-fib, high cholesterol ,HTN, trigeminal neuralgia, essential tremors    Patient Stated Goals learn from all providers    Currently in Pain? Yes    Pain Score 4     Pain Location Axilla    Pain Orientation Left    Pain Descriptors / Indicators Sore    Pain Type Surgical pain    Pain Frequency Intermittent                 LYMPHEDEMA/ONCOLOGY QUESTIONNAIRE - 01/23/20 0001      Left Upper Extremity Lymphedema   15 cm Proximal to Olecranon Process 42 cm    10 cm Proximal to Olecranon Process 39.6 cm    Olecranon Process 30 cm    10 cm Proximal to Ulnar Styloid Process 23.9 cm    Just Proximal to Ulnar Styloid Process 17 cm    Across Hand at [employer] 20 cm    At [place] of 2nd Digit 6.8 cm                      [facility] Adult PT Treatment/Exercise - 01/23/20 0001      Manual Therapy  Manual Therapy Manual Lymphatic Drainage (MLD)    Edema Management circumferential measurements taken today- verbally instructed pt throughout and had pt return demonstrate correct skin stretch technique- educated pt about proper skin stretch, speed and sequence and issued handout    Manual Lymphatic Drainage (MLD) short neck, 5 diaphragmatic breaths, left inguinal nodes and establishment of axillo inguinal pathway, right axillary nodes and establishment of inter axillary pathway, L breast in area of seroma moving fluid towards pathways and L UE moving fluid towards pathways then retracing all steps                       PT Long Term Goals - 01/08/20 1351      PT LONG TERM GOAL #1   Title Pt will receive compression sleeve for prophylactic use when she flies    Time 4    Period Weeks    Status New    [place] Date 02/05/20      PT LONG TERM GOAL #2   Title Pt will be independent in self MLD for L breast to help decrease seroma    Time 4    Period Weeks     Status New    Target Date 02/05/20                 Plan - 01/23/20 1404    Clinical Impression Statement Pt still presents with a seroma but reports it is improving. Seroma is still palpable in superior left breast but did seem to respond and soften with MLD today. Pt will receive her compression sleeve this week. Educated pt to hold off on wearing it so therapist can take circumference measurements prior to wearing compression. Her circumference at 10 cm proximal to olecranon has increased since last session and is 3 cm larger than the opposite side. Will continue to monitor this at next session since this could be a result of surgery and radiation. Pt will finish radiation tomorrow. Began educating pt in self MLD for left breast and L UE and issued handout. Educated pt about importance of proper skin stretch, speed and sequence. Will have pt return demonstrate at next session.    PT Frequency 1x / week    PT Duration 4 weeks    PT Treatment/Interventions ADLs/Self Care Home Management;Patient/family education;Therapeutic exercise;Manual techniques;Manual lymph drainage;Passive range of motion;Scar mobilization    PT Next Visit Plan continue to measure circumferences of UEs and monitor for swelling, see if pt got compression sleeve (rx issued today), instruct in MLD for L breast and arm and issue handout, MLD for seroma L breast    PT Home Exercise Plan wear sports bra with chip pack    Consulted and Agree with Plan of Care Patient           Patient will benefit from skilled therapeutic intervention in order to improve the following deficits and impairments:  Postural dysfunction, Increased edema, Decreased knowledge of precautions  Visit Diagnosis: Localized edema     Problem List Patient Active Problem List   Diagnosis Date Noted  . Genetic testing 11/07/2019  . Malignant neoplasm of upper-outer quadrant of left breast in female, estrogen receptor positive ([place]) 10/26/2019  .  Atrial fibrillation ([place]) 10/26/2019  . Chronic anticoagulation 10/26/2019  . Morbid obesity with BMI of 40.0-44.9, adult ([place]) 10/26/2019  . Family history of breast cancer   . Family history of pancreatic cancer   . Family history of prostate cancer   . Family  history of melanoma     Allyson Sabal [facility] 01/23/2020, 2:08 PM  [place], [place], [address] Phone: [phone]   Fax:  [phone]  Name: LAELYNN BLIZZARD MRN: [id number] Date of Birth: 08/22/1950  Manus Gunning, PT 01/23/20 2:08 PM

## 2020-01-24 ENCOUNTER — Encounter: Payer: Self-pay | Admitting: Radiation Oncology

## 2020-01-24 ENCOUNTER — Ambulatory Visit
Admission: RE | Admit: 2020-01-24 | Discharge: 2020-01-24 | Disposition: A | Discharge status: Home / Self Care | Payer: Medicare Other | Source: Ambulatory Visit | Attending: Radiation Oncology | Admitting: Radiation Oncology

## 2020-01-24 ENCOUNTER — Encounter: Payer: Self-pay | Admitting: *Deleted

## 2020-01-24 ENCOUNTER — Other Ambulatory Visit: Payer: Self-pay

## 2020-01-24 DIAGNOSIS — Z510 Encounter for antineoplastic radiation therapy: Secondary | ICD-10-CM | POA: Diagnosis not present

## 2020-01-26 ENCOUNTER — Inpatient Hospital Stay: Payer: Medicare Other | Attending: Oncology | Admitting: Internal Medicine

## 2020-01-26 ENCOUNTER — Other Ambulatory Visit: Payer: Self-pay

## 2020-01-26 VITALS — BP 145/78 | HR 86 | Temp 97.7°F | Resp 17 | Ht 64.0 in | Wt 250.3 lb

## 2020-01-26 DIAGNOSIS — G250 Essential tremor: Secondary | ICD-10-CM | POA: Insufficient documentation

## 2020-01-26 DIAGNOSIS — Z803 Family history of malignant neoplasm of breast: Secondary | ICD-10-CM | POA: Diagnosis not present

## 2020-01-26 DIAGNOSIS — Z800 Family history of malignant neoplasm of digestive organs: Secondary | ICD-10-CM | POA: Diagnosis not present

## 2020-01-26 DIAGNOSIS — G500 Trigeminal neuralgia: Secondary | ICD-10-CM | POA: Insufficient documentation

## 2020-01-26 DIAGNOSIS — Z170 Estrogen receptor positive status [ER+]: Secondary | ICD-10-CM

## 2020-01-26 DIAGNOSIS — Z806 Family history of leukemia: Secondary | ICD-10-CM | POA: Diagnosis not present

## 2020-01-26 DIAGNOSIS — Z90710 Acquired absence of both cervix and uterus: Secondary | ICD-10-CM | POA: Insufficient documentation

## 2020-01-26 DIAGNOSIS — C50412 Malignant neoplasm of upper-outer quadrant of left female breast: Secondary | ICD-10-CM | POA: Diagnosis not present

## 2020-01-26 MED ORDER — OXCARBAZEPINE 300 MG PO TABS: 300.0000 mg | ORAL_TABLET | Freq: Two times a day (BID) | ORAL | 3 refills | Status: DC

## 2020-01-26 NOTE — Progress Notes (Signed)
[facility] [place], [place] [address] [phone]   New Patient Evaluation  Date of Service: 01/26/20 Patient Name: Lisa Cardenas Patient MRN: [id number] Patient DOB: 1951-06-22 Provider: Ventura Sellers, MD  Identifying Statement:  Lisa Cardenas is a 69 y.o. female with Trigeminal neuralgia of left side of face [G50.0] who presents for initial consultation and evaluation regarding cancer associated neurologic deficits.    Referring Provider: Eppie Gibson, MD [address] [place],  [place] [address]  Primary Cancer:  Oncologic History: Oncology History  Malignant neoplasm of upper-outer quadrant of left breast in female, estrogen receptor positive ([place])  10/26/2019 Initial Diagnosis   Malignant neoplasm of upper-outer quadrant of left breast in female, estrogen receptor positive ([place])   11/01/2019 Cancer Staging   Staging form: Breast, AJCC 8th Edition - Clinical: Stage IA (cT1a, cN0, cM0, G2, ER+, PR+, HER2-) - Signed by Gardenia Phlegm, NP on 11/01/2019   11/03/2019 Genetic Testing   Negative genetic testing. No pathogenic variants identified on the [facility] Breast Cancer STAT Panel + Common Hereditary Cancers Panel. The report date is 11/03/2019.  The STAT Breast cancer panel offered by [facility] includes sequencing and rearrangement analysis for the following 9 genes:  ATM, BRCA1, BRCA2, CDH1, CHEK2, PALB2, PTEN, STK11 and TP53.    The Common Hereditary Cancers Panel offered by [facility] includes sequencing and/or deletion duplication testing of the following 48 genes: APC, ATM, AXIN2, BARD1, BMPR1A, BRCA1, BRCA2, BRIP1, CDH1, CDKN2A (p14ARF), CDKN2A (p16INK4a), CKD4, CHEK2, CTNNA1, DICER1, EPCAM (Deletion/duplication testing only), GREM1 (promoter region deletion/duplication testing only), KIT, MEN1, MLH1, MSH2, MSH3, MSH6, MUTYH, NBN, NF1, NHTL1, PALB2, PDGFRA, PMS2, POLD1, POLE, PTEN, RAD50, RAD51C, RAD51D,  RNF43, SDHB, SDHC, SDHD, SMAD4, SMARCA4. STK11, TP53, TSC1, TSC2, and VHL.  The following genes were evaluated for sequence changes only: SDHA and HOXB13 c.251G>A variant only.   11/23/2019 Cancer Staging   Staging form: Breast, AJCC 8th Edition - Pathologic stage from 11/23/2019: Stage IA (pT1b, pN0, cM0, G2, ER+, PR+, HER2-) - Signed by Gardenia Phlegm, NP on 12/13/2019     History of Present Illness: The patient's records from the referring physician were obtained and reviewed and the patient interviewed to confirm this HPI.  Lisa Cardenas presents to discuss facial pain syndrome.  She describes episodes of "lightning-type pain" lasting several seconds, affecting the mid and lower half of left side of face.  This started several years ago, around the time of cancer diagnosis.  She was started at that time on Tegretol by her family physician, which was very effective.  More recently she has had recurrence of pain, although character is somewhat different.  She now describes it more like "pins and needles sharp pain" that is more continuous, affects left forehead.  There is a "hotspot" on her forehead that if touched can reproduce the symptoms.  Now taking 242m of tegretol and 3084mof gabapentin twice per day. Also describes ongoing essential tremors for which she has been taking Primodone 5074maily.  Medications: Current Outpatient Medications on File Prior to Visit  Medication Sig Dispense Refill  . carbamazepine (TEGRETOL) 200 MG tablet Take 200 mg by mouth daily with breakfast.     . celecoxib (CELEBREX) 200 MG capsule Take 200 mg by mouth every 30 (thirty) days.    . eMarland Kitchenomeprazole (NEXIUM) 40 MG capsule Take 40 mg by mouth 2 (two) times daily as needed.    . gabapentin (NEURONTIN)  300 MG capsule Take 300 mg by mouth 2 (two) times daily. One in late morning and one at night    . levothyroxine (SYNTHROID) 137 MCG tablet Take 137 mcg by mouth daily. Current dose 137 mcg daily    .  LORazepam (ATIVAN) 0.5 MG tablet Take 0.5 mg by mouth as needed for anxiety.    . metoprolol succinate (TOPROL-XL) 100 MG 24 hr tablet Take 100 mg by mouth 2 (two) times daily.    Marland Kitchen olmesartan-hydrochlorothiazide (BENICAR HCT) 20-12.5 MG tablet Take 1 tablet by mouth daily.    . primidone (MYSOLINE) 50 MG tablet Take 50 mg by mouth daily.     . rivaroxaban (XARELTO) 20 MG TABS tablet Take 20 mg by mouth daily.    . sertraline (ZOLOFT) 50 MG tablet Take 50 mg by mouth daily.    . simvastatin (ZOCOR) 40 MG tablet Take 40 mg by mouth daily.    . Vitamin D, Ergocalciferol, (DRISDOL) 1.25 MG (50000 UNIT) CAPS capsule Take 50,000 Units by mouth once a week.     No current facility-administered medications on file prior to visit.    Allergies:  Allergies  Allergen Reactions  . Plaquenil [Hydroxychloroquine]     Increased heart rate   Past Medical History:  Past Medical History:  Diagnosis Date  . A-fib [facility])    no issues since May 2020  . Arrhythmia   . Cancer ([place])   . Diverticulosis   . Elevated cholesterol   . Family history of breast cancer   . Family history of melanoma   . Family history of pancreatic cancer   . Family history of prostate cancer   . Hypertension   . Obesity   . Pancreatitis   . Thyroid disease   . Trigeminal neuralgia   . Trigeminal neuralgia   . UTI (urinary tract infection)    Past Surgical History:  Past Surgical History:  Procedure Laterality Date  . ABDOMINAL HYSTERECTOMY  1980  . BLADDER REPAIR    . BREAST LUMPECTOMY WITH RADIOACTIVE SEED AND SENTINEL LYMPH NODE BIOPSY Left 11/23/2019   Procedure: LEFT BREAST LUMPECTOMY WITH RADIOACTIVE SEED AND LEFT AXILLARY SENTINEL LYMPH NODE BIOPSY;  Surgeon: Rolm Bookbinder, MD;  Location: [facility];  Service: General;  Laterality: Left;  PEC BLOCK  . [facility]ONOSCOPY     [facility]  . [facility]OPHAGOGASTRODUODENOSCOPY     [facility]  . KNEE ARTHROSCOPY Left   . TONSILLECTOMY     Social History:  Social History     Socioeconomic History  . Marital status: Married    Spouse name: Not on file  . Number of children: Not on file  . Years of education: Not on file  . Highest education level: Not on file  Occupational History  . Not on file  Tobacco Use  . Smoking status: Never Smoker  . Smokeless tobacco: Never Used  Vaping Use  . Vaping Use: Never used  Substance and Sexual Activity  . Alcohol use: Not Currently  . Drug use: Not Currently  . Sexual activity: Not on file  Other Topics Concern  . Not on file  Social History Narrative  . Not on file   Social Determinants of Health   Financial Resource Strain:   . Difficulty of Paying Living Expenses:   Food Insecurity:   . Worried About Charity fundraiser in the Last Year:   . Arboriculturist in the Last Year:   Transportation Needs:   .  Lack of Transportation (Medical):   Marland Kitchen Lack of Transportation (Non-Medical):   Physical Activity:   . Days of Exercise per Week:   . Minutes of Exercise per Session:   Stress:   . Feeling of Stress :   Social Connections:   . Frequency of Communication with Friends and Family:   . Frequency of Social Gatherings with Friends and Family:   . Attends Religious Services:   . Active Member of Clubs or Organizations:   . Attends Archivist Meetings:   Marland Kitchen Marital Status:   Intimate Partner Violence:   . Fear of Current or Ex-Partner:   . Emotionally Abused:   Marland Kitchen Physically Abused:   . Sexually Abused:    Family History:  Family History  Problem Relation Age of Onset  . Colon cancer Mother        dx 23  . Breast cancer Maternal Grandmother   . Breast cancer Maternal Aunt   . Prostate cancer Maternal Uncle   . Breast cancer Paternal Aunt   . Melanoma Paternal Uncle   . Breast cancer Maternal Aunt   . Leukemia Maternal Uncle   . Breast cancer Paternal Aunt   . Colon cancer Paternal Aunt   . Melanoma Daughter     Review of Systems: Constitutional: Doesn't report fevers, chills or  abnormal weight loss Eyes: Doesn't report blurriness of vision Ears, nose, mouth, throat, and face: Doesn't report sore throat Respiratory: Doesn't report cough, dyspnea or wheezes Cardiovascular: Doesn't report palpitation, chest discomfort  Gastrointestinal:  Doesn't report nausea, constipation, diarrhea GU: Doesn't report incontinence Skin: Doesn't report skin rashes Neurological: Per HPI Musculoskeletal: Doesn't report joint pain Behavioral/Psych: Doesn't report anxiety  Physical Exam: Vitals:   01/26/20 1111  BP: (!) 145/78  Pulse: 86  Resp: 17  Temp: 97.7 F (36.5 C)  SpO2: 100%   KPS: 90. General: Alert, cooperative, pleasant, in no acute distress Head: Normal EENT: No conjunctival injection or scleral icterus.  Lungs: Resp effort normal Cardiac: Regular rate Abdomen: Non-distended abdomen Skin: No rashes cyanosis or petechiae. Extremities: No clubbing or edema  Neurologic Exam: Mental Status: Awake, alert, attentive to examiner. Oriented to self and environment. Language is fluent with intact comprehension.  Cranial Nerves: Visual acuity is grossly normal. Visual fields are full. Extra-ocular movements intact. No ptosis. Face is symmetric Motor: Tone and bulk are normal. Power is full in both arms and legs.  Sensory: Intact to light touch Gait: Normal.   Labs: I have reviewed the data as listed    Component Value Date/Time   NA 138 11/20/2019 1100   K 4.0 11/20/2019 1100   CL 101 11/20/2019 1100   CO2 27 11/20/2019 1100   GLUCOSE 100 (H) 11/20/2019 1100   BUN 15 11/20/2019 1100   CREATININE 0.76 11/20/2019 1100   CREATININE 0.78 10/26/2019 1507   CALCIUM 9.8 11/20/2019 1100   PROT 7.4 10/26/2019 1507   ALBUMIN 4.1 10/26/2019 1507   AST 11 (L) 10/26/2019 1507   ALT 15 10/26/2019 1507   ALKPHOS 79 10/26/2019 1507   BILITOT 0.4 10/26/2019 1507   GFRNONAA >60 11/20/2019 1100   GFRNONAA >60 10/26/2019 1507   GFRAA >60 11/20/2019 1100   GFRAA >60  10/26/2019 1507   Lab Results  Component Value Date   WBC 6.7 10/26/2019   NEUTROABS 4.6 10/26/2019   HGB 14.2 10/26/2019   HCT 44.0 10/26/2019   MCV 87.0 10/26/2019   PLT 219 10/26/2019     Assessment/Plan Trigeminal  neuralgia of left side of face [G50.0]  NIQUITA DIGIOIA presents with facial pain syndrome, consistent with either primary trigeminal neuralgia or trigeminal neuropathy.  More recent symptomatology is less consistent with classic TN.  Presence of unilateral facial rash 3 months ago does arouse some suspicion for post-herpetic neuralgia.    For now, we discussed limitations of Tegretrol given drug interactions with her anticoagulant, as well as generally poor side effect profile in cancer patients (cytopenias).  It might be preferable to transition to Trileptal which would not carry same side effect or drug interaction issues.    Recommended trial of Trileptal 356m BID.  She may continue to dose Gabapentin 3076mBID.  Also discussed standard workup for TN includes MRI brain with MRA head.  This is generally considered to rule out CP angle mass, demyelinating disease, and to identify vascular compression which could be downstream surgical target.   We spent twenty additional minutes teaching regarding the natural history, biology, and historical experience in the treatment of neurologic complications of cancer.   We appreciate the opportunity to participate in the care of PaFRENCHIE PRIBYL She will follow up with us[place]oreaext month following MRI brain and trial of Trileptal.  All questions were answered. The patient knows to call the clinic with any problems, questions or concerns. No barriers to learning were detected.  The total time spent in the encounter was 40 minutes and more than 50% was on counseling and review of test results   ZaVentura SellersMD Medical Director of Neuro-Oncology [facility] We[place]7/16/21 3:28 PM

## 2020-01-29 ENCOUNTER — Telehealth: Payer: Self-pay | Admitting: Internal Medicine

## 2020-01-29 NOTE — Telephone Encounter (Signed)
Scheduled per 7/16 los. Pt is aware of appt time and date.

## 2020-02-01 ENCOUNTER — Other Ambulatory Visit: Payer: Self-pay | Admitting: Radiation Therapy

## 2020-02-05 ENCOUNTER — Ambulatory Visit: Payer: Medicare Other | Admitting: Physical Therapy

## 2020-02-05 ENCOUNTER — Other Ambulatory Visit: Payer: Self-pay

## 2020-02-05 ENCOUNTER — Encounter: Payer: Self-pay | Admitting: Physical Therapy

## 2020-02-05 DIAGNOSIS — R600 Localized edema: Secondary | ICD-10-CM | POA: Diagnosis not present

## 2020-02-05 NOTE — Therapy (Signed)
[name], [place], [address] Phone: [phone]   Fax:  [phone]  Physical Therapy Treatment  Patient Details  Name: Lisa Cardenas MRN: [id number] Date of Birth: 09/15/1950 Referring Provider (PT): [place]   Encounter Date: 02/05/2020   PT End of Session - 02/05/20 1400    Visit Number 4    Number of Visits 6    Date for PT Re-Evaluation 03/04/20    PT Start Time 1304    PT Stop Time 1400    PT Time Calculation (min) 56 min    Activity Tolerance Patient tolerated treatment well    Behavior During Therapy [facility] for tasks assessed/performed           Past Medical History:  Diagnosis Date  . A-fib [facility])    no issues since May 2020  . Arrhythmia   . Cancer ([place])   . Diverticulosis   . Elevated cholesterol   . Family history of breast cancer   . Family history of melanoma   . Family history of pancreatic cancer   . Family history of prostate cancer   . Hypertension   . Obesity   . Pancreatitis   . Thyroid disease   . Trigeminal neuralgia   . Trigeminal neuralgia   . UTI (urinary tract infection)     Past Surgical History:  Procedure Laterality Date  . ABDOMINAL HYSTERECTOMY  1980  . BLADDER REPAIR    . BREAST LUMPECTOMY WITH RADIOACTIVE SEED AND SENTINEL LYMPH NODE BIOPSY Left 11/23/2019   Procedure: LEFT BREAST LUMPECTOMY WITH RADIOACTIVE SEED AND LEFT AXILLARY SENTINEL LYMPH NODE BIOPSY;  Surgeon: Rolm Bookbinder, MD;  Location: [facility];  Service: General;  Laterality: Left;  PEC BLOCK  . COLONOSCOPY     [facility]  . ESOPHAGOGASTRODUODENOSCOPY     [facility]  . KNEE ARTHROSCOPY Left   . TONSILLECTOMY      There were no vitals filed for this visit.   Subjective Assessment - 02/05/20 1304    Subjective My sleeve is in but I have not picked it up yet. The seroma is about the same maybe a little smaller.    Pertinent History Left lumpectomy with SLNB 11/23/19,  with Dr.  Donne Hazel most likely no chemotherapy and will have radiation. 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   Other history: A-fib, high cholesterol ,HTN, trigeminal neuralgia, essential tremors    Patient Stated Goals learn from all providers    Currently in Pain? Yes    Pain Score 4     Pain Location Axilla    Pain Orientation Left    Pain Descriptors / Indicators Tender    Pain Type Surgical pain                 LYMPHEDEMA/ONCOLOGY QUESTIONNAIRE - 02/05/20 0001      Left Upper Extremity Lymphedema   15 cm Proximal to Olecranon Process 40.8 cm    10 cm Proximal to Olecranon Process 38 cm    Olecranon Process 29.2 cm    10 cm Proximal to Ulnar Styloid Process 23.6 cm    Just Proximal to Ulnar Styloid Process 16.8 cm    Across Hand at [employer] 19.7 cm    At [place] of 2nd Digit 7 cm                      [facility] Adult PT Treatment/Exercise - 02/05/20 0001  Manual Therapy   Manual Therapy Manual Lymphatic Drainage (MLD);Edema management    Edema Management circumferential measurements taken today- 1.5cm reduction noted 10 cm proximal to olecranon    Manual Lymphatic Drainage (MLD) short neck, 5 diaphragmatic breaths, left inguinal nodes and establishment of axillo inguinal pathway, right axillary nodes and establishment of inter axillary pathway, L breast in area of seroma moving fluid towards pathways and L UE moving fluid towards pathways then retracing all steps                       PT Long Term Goals - 01/08/20 1351      PT LONG TERM GOAL #1   Title Pt will receive compression sleeve for prophylactic use when she flies    Time 4    Period Weeks    Status New    Target Date 02/05/20      PT LONG TERM GOAL #2   Title Pt will be independent in self MLD for L breast to help decrease seroma    Time 4    Period Weeks    Status New    Target Date 02/05/20                 Plan - 02/05/20 1401    Clinical Impression  Statement Remeasured circumferences today and pt demonstrates a 1.5 cm reduction at 10 cm proximal to olecranon which is pt's main area of swelling. Her compression sleeve has come in but she has not been in town to pick it up. Educated pt to not wear sleeve until after next appointment so we can continue to monitor her swelling and she if she has further reduction at 10 cm proximal to olecranon. Pt did have some redness of left breast today most likely from the radiation. There was no warmth or tenderness. Continued with MLD to left breast in area of seroma which softened signficantly and to L UE.    PT Frequency 1x / week    PT Duration 4 weeks    PT Treatment/Interventions ADLs/Self Care Home Management;Patient/family education;Therapeutic exercise;Manual techniques;Manual lymph drainage;Passive range of motion;Scar mobilization    PT Next Visit Plan continue to measure circumferences of UEs and see if she has further reduction of 10 cm prox to olecranon, see assessment from this visit,  see if pt got compression sleeve (pt can begin to wear if swelling has increased- can ask Rook Maue about this), continue MLD to breast and LUE    PT Home Exercise Plan wear sports bra with chip pack    Consulted and Agree with Plan of Care Patient           Patient will benefit from skilled therapeutic intervention in order to improve the following deficits and impairments:  Postural dysfunction, Increased edema, Decreased knowledge of precautions  Visit Diagnosis: Localized edema     Problem List Patient Active Problem List   Diagnosis Date Noted  . Trigeminal neuralgia of left side of face 01/26/2020  . Genetic testing 11/07/2019  . Malignant neoplasm of upper-outer quadrant of left breast in female, estrogen receptor positive ([place]) 10/26/2019  . Atrial fibrillation ([place]) 10/26/2019  . Chronic anticoagulation 10/26/2019  . Morbid obesity with BMI of 40.0-44.9, adult ([place]) 10/26/2019  . Family history  of breast cancer   . Family history of pancreatic cancer   . Family history of prostate cancer   . Family history of melanoma     Allyson Sabal [facility] 02/05/2020, 2:04  PM  [place], [place], [address] Phone: [phone]   Fax:  [phone]  Name: Lisa Cardenas MRN: [id number] Date of Birth: 28-Aug-1950  Manus Gunning, PT 02/05/20 2:04 PM

## 2020-02-12 ENCOUNTER — Encounter: Payer: Self-pay | Admitting: Oncology

## 2020-02-13 ENCOUNTER — Other Ambulatory Visit: Payer: Self-pay

## 2020-02-13 ENCOUNTER — Ambulatory Visit: Payer: Medicare Other | Attending: Oncology

## 2020-02-13 DIAGNOSIS — Z170 Estrogen receptor positive status [ER+]: Secondary | ICD-10-CM | POA: Insufficient documentation

## 2020-02-13 DIAGNOSIS — R600 Localized edema: Secondary | ICD-10-CM | POA: Insufficient documentation

## 2020-02-13 DIAGNOSIS — C50912 Malignant neoplasm of unspecified site of left female breast: Secondary | ICD-10-CM

## 2020-02-13 DIAGNOSIS — R293 Abnormal posture: Secondary | ICD-10-CM | POA: Insufficient documentation

## 2020-02-13 NOTE — Therapy (Signed)
[place], [place], [address] Phone: [phone]   Fax:  [phone]  Physical Therapy Treatment  Patient Details  Name: Lisa Cardenas MRN: [id number] Date of Birth: 1951/04/14 Referring Provider (PT): [place]   Encounter Date: 02/13/2020   PT End of Session - 02/13/20 1224    Visit Number 5    Number of Visits 6    Date for PT Re-Evaluation 03/04/20    PT Start Time 1829    PT Stop Time 1206    PT Time Calculation (min) 61 min    Activity Tolerance Patient tolerated treatment well    Behavior During Therapy [facility] for tasks assessed/performed           Past Medical History:  Diagnosis Date  . A-fib [facility])    no issues since May 2020  . Arrhythmia   . Cancer ([place])   . Diverticulosis   . Elevated cholesterol   . Family history of breast cancer   . Family history of melanoma   . Family history of pancreatic cancer   . Family history of prostate cancer   . Hypertension   . Obesity   . Pancreatitis   . Thyroid disease   . Trigeminal neuralgia   . Trigeminal neuralgia   . UTI (urinary tract infection)     Past Surgical History:  Procedure Laterality Date  . ABDOMINAL HYSTERECTOMY  1980  . BLADDER REPAIR    . BREAST LUMPECTOMY WITH RADIOACTIVE SEED AND SENTINEL LYMPH NODE BIOPSY Left 11/23/2019   Procedure: LEFT BREAST LUMPECTOMY WITH RADIOACTIVE SEED AND LEFT AXILLARY SENTINEL LYMPH NODE BIOPSY;  Surgeon: Rolm Bookbinder, MD;  Location: [facility];  Service: General;  Laterality: Left;  PEC BLOCK  . COLONOSCOPY     [facility]  . ESOPHAGOGASTRODUODENOSCOPY     [facility]  . KNEE ARTHROSCOPY Left   . TONSILLECTOMY      There were no vitals filed for this visit.   Subjective Assessment - 02/13/20 1108    Subjective I'm still having the tightness and achiness at my upper lateral Lt breast but the burning is improved. I'm wearing the foam she gave me alot more often. It helps while  it's in my bra but hte swelling comes back pretty quickly after removing it. I'm getting my compression sleeve after I leave here today.    Pertinent History Left lumpectomy with SLNB 11/23/19,  with Dr. Donne Hazel most likely no chemotherapy and will have radiation. 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   Other history: A-fib, high cholesterol ,HTN, trigeminal neuralgia, essential tremors    Patient Stated Goals learn from all providers    Currently in Pain? No/denies                 LYMPHEDEMA/ONCOLOGY QUESTIONNAIRE - 02/13/20 0001      Left Upper Extremity Lymphedema   15 cm Proximal to Olecranon Process 40.9 cm    10 cm Proximal to Olecranon Process 38.8 cm    Olecranon Process 28.4 cm    10 cm Proximal to Ulnar Styloid Process 24.1 cm    Just Proximal to Ulnar Styloid Process 16.2 cm    Across Hand at [employer] 19.5 cm    At [place] of 2nd Digit 6.4 cm                      [facility] Adult PT Treatment/Exercise - 02/13/20 0001  Shoulder Exercises: Supine   Horizontal ABduction Strengthening;Both;5 reps;Theraband    Theraband Level (Shoulder Horizontal ABduction) Level 2 (Red)    Horizontal ABduction Limitations Pt returned correct demonstration for all supine exercises    External Rotation Strengthening;Both;5 reps;Theraband    Theraband Level (Shoulder External Rotation) Level 2 (Red)    Flexion Strengthening;Both;5 reps;Theraband   Narrow and Wide grip, 5x each   Theraband Level (Shoulder Flexion) Level 2 (Red)    Diagonals Strengthening;Right;Left;5 reps;Theraband    Theraband Level (Shoulder Diagonals) Level 2 (Red)      Manual Therapy   Manual Therapy Myofascial release;Manual Lymphatic Drainage (MLD)    Edema Management circumferential measurements taken today, and pt had some increase at 10 cm prox from ulnar styloid process and olecranon process from baseline so encouraged pt to wear her compression sleeve daily that she is picking up  today    Myofascial Release Brief MFR to Lt axilla to assess tightness at axilla but other than mild tenderness at incision, pt with no tissue limitations    Manual Lymphatic Drainage (MLD) short neck, 5 diaphragmatic breaths, left inguinal nodes and establishment of axillo inguinal pathway, right axillary nodes and establishment of inter axillary pathway, L breast in area of seroma moving fluid towards pathways and L UE moving fluid towards pathways then retracing all steps                  PT Education - 02/13/20 1208    Education Details Supine scapular series with red theraband    Person(s) Educated Patient    Methods Explanation;Demonstration;Handout    Comprehension Verbalized understanding;Returned demonstration;Need further instruction   struggled with D2 technique the most              PT Long Term Goals - 01/08/20 1351      PT LONG TERM GOAL #1   Title Pt will receive compression sleeve for prophylactic use when she flies    Time 4    Period Weeks    Status New    Target Date 02/05/20      PT LONG TERM GOAL #2   Title Pt will be independent in self MLD for L breast to help decrease seroma    Time 4    Period Weeks    Status New    Target Date 02/05/20                 Plan - 02/13/20 1224    Clinical Impression Statement Continued with monitoring circumference of Lt UE. Pt presents with some increases from baseline (see flowsheet) so instructed her to wear her compression sleeve that she is picking up today daily, or at least with increased activities.She verbalized understanding. Progressed her HEP to include supine scapular series with red theraband. Overall she tolerated this well but did did struggle with technique with D2 and will require further reinforcement of this. Continued with focus on MLD as well to Lt superior breast at area of seroma but this was very mildly palpable today and even seemed to feel like scar tissue. Pt feels this has reduced  dramatically recently with consistent wear of compression foam and performing self MLD.    Personal Factors and Comorbidities Age;Fitness;Comorbidity 2    Comorbidities will be having radiation and SLNB    Stability/Clinical Decision Making Stable/Uncomplicated    Rehab Potential Good    PT Frequency 1x / week    PT Duration 4 weeks    PT Treatment/Interventions ADLs/Self Care  Home Management;Patient/family education;Therapeutic exercise;Manual techniques;Manual lymph drainage;Passive range of motion;Scar mobilization    PT Next Visit Plan Assess fit of Lt UE compressoin sleeve if pt brings and educate on best time to wear this (daily unless she is able to self manage with self MLD?); continue MLD to Lt breast/UE and she may be ready for D/C at next session.    PT Home Exercise Plan wear sports bra with chip pack; supine scapular series    Consulted and Agree with Plan of Care Patient           Patient will benefit from skilled therapeutic intervention in order to improve the following deficits and impairments:  Postural dysfunction, Increased edema, Decreased knowledge of precautions  Visit Diagnosis: Localized edema  Abnormal posture  Malignant neoplasm of left breast in female, estrogen receptor positive, unspecified site of breast [facility])     Problem List Patient Active Problem List   Diagnosis Date Noted  . Trigeminal neuralgia of left side of face 01/26/2020  . Genetic testing 11/07/2019  . Malignant neoplasm of upper-outer quadrant of left breast in female, estrogen receptor positive ([place]) 10/26/2019  . Atrial fibrillation ([place]) 10/26/2019  . Chronic anticoagulation 10/26/2019  . Morbid obesity with BMI of 40.0-44.9, adult ([place]) 10/26/2019  . Family history of breast cancer   . Family history of pancreatic cancer   . Family history of prostate cancer   . Family history of melanoma     Otelia Limes, [place] 02/13/2020, 12:32 PM  [place], [place], [address] Phone: [phone]   Fax:  [phone]  Name: SHARESA KEMP MRN: [id number] Date of Birth: 05-30-1951

## 2020-02-13 NOTE — Patient Instructions (Signed)

## 2020-02-20 ENCOUNTER — Other Ambulatory Visit: Payer: Self-pay

## 2020-02-20 ENCOUNTER — Encounter: Payer: Self-pay | Admitting: Physical Therapy

## 2020-02-20 ENCOUNTER — Ambulatory Visit: Payer: Medicare Other | Admitting: Physical Therapy

## 2020-02-20 DIAGNOSIS — R600 Localized edema: Secondary | ICD-10-CM | POA: Diagnosis not present

## 2020-02-20 NOTE — Therapy (Signed)
[place], [place], [address] Phone: [phone]   Fax:  [phone]  Physical Therapy Treatment  Patient Details  Name: Lisa Cardenas MRN: [id number] Date of Birth: 24-Apr-1951 Referring Provider (PT): [place]   Encounter Date: 02/20/2020   PT End of Session - 02/20/20 1611    Visit Number 6    Number of Visits 6    Date for PT Re-Evaluation 03/04/20    PT Start Time 2482   pt arrived late   PT Stop Time 1610    PT Time Calculation (min) 49 min    Activity Tolerance Patient tolerated treatment well    Behavior During Therapy [facility] for tasks assessed/performed           Past Medical History:  Diagnosis Date  . A-fib [facility])    no issues since May 2020  . Arrhythmia   . Cancer ([place])   . Diverticulosis   . Elevated cholesterol   . Family history of breast cancer   . Family history of melanoma   . Family history of pancreatic cancer   . Family history of prostate cancer   . Hypertension   . Obesity   . Pancreatitis   . Thyroid disease   . Trigeminal neuralgia   . Trigeminal neuralgia   . UTI (urinary tract infection)     Past Surgical History:  Procedure Laterality Date  . ABDOMINAL HYSTERECTOMY  1980  . BLADDER REPAIR    . BREAST LUMPECTOMY WITH RADIOACTIVE SEED AND SENTINEL LYMPH NODE BIOPSY Left 11/23/2019   Procedure: LEFT BREAST LUMPECTOMY WITH RADIOACTIVE SEED AND LEFT AXILLARY SENTINEL LYMPH NODE BIOPSY;  Surgeon: Rolm Bookbinder, MD;  Location: [place];  Service: General;  Laterality: Left;  PEC BLOCK  . COLONOSCOPY     [facility]  . ESOPHAGOGASTRODUODENOSCOPY     [facility]  . KNEE ARTHROSCOPY Left   . TONSILLECTOMY      There were no vitals filed for this visit.   Subjective Assessment - 02/20/20 1521    Subjective I can not wear the sleeve. It is so tight I can not wear it. My hand went numb.    Pertinent History Left lumpectomy with SLNB 11/23/19,  with Dr.  Donne Hazel most likely no chemotherapy and will have radiation. 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   Other history: A-fib, high cholesterol ,HTN, trigeminal neuralgia, essential tremors    Patient Stated Goals learn from all providers    Currently in Pain? No/denies    Pain Score 0-No pain                 LYMPHEDEMA/ONCOLOGY QUESTIONNAIRE - 02/20/20 0001      Left Upper Extremity Lymphedema   15 cm Proximal to Olecranon Process 42 cm    10 cm Proximal to Olecranon Process 38.5 cm    Olecranon Process 28.5 cm    10 cm Proximal to Ulnar Styloid Process 23 cm    Just Proximal to Ulnar Styloid Process 16.5 cm    Across Hand at [employer] 19.5 cm    At [place] of 2nd Digit 6.5 cm                      [facility] Adult PT Treatment/Exercise - 02/20/20 0001      Manual Therapy   Edema Management circumferential measurements taken, assessed sleeve for fit, discussed other options- see assessment    Manual Lymphatic Drainage (  MLD) short neck, 5 diaphragmatic breaths, left inguinal nodes and establishment of axillo inguinal pathway, right axillary nodes and establishment of inter axillary pathway, L UE workin proximal to distal moving fluid towards pathways then retracing all steps                       PT Long Term Goals - 01/08/20 1351      PT LONG TERM GOAL #1   Title Pt will receive compression sleeve for prophylactic use when she flies    Time 4    Period Weeks    Status New    Target Date 02/05/20      PT LONG TERM GOAL #2   Title Pt will be independent in self MLD for L breast to help decrease seroma    Time 4    Period Weeks    Status New    Target Date 02/05/20                 Plan - 02/20/20 1612    Clinical Impression Statement Pt arrived with newly obtained compression sleeve. Pt reports that when she tried to wear it her hand became very cold. Educated pt how to don compression sleeve and pt return demonstrated.  Check sleeve for fit and it appeared to fit well but pts fingers were more cold than the other side. Pt had increased sensitivity in the upper arm with the sleeve on. Pt feeling frustrated about developing lymphedema and having to wear sleeve daily. Educated pt that she may be able to go without wearing sleeve every day if she keeps track of the swelling. Educated pt how to measure circumferences using olecranon as landmark. Educated pt to try and wear the sleeve for the rest of the day and remove if she has numbness or if her fingers get more cold. Educated pt to try and wear the sleeve tomorrow as well and if pt is still having difficulty will work on another option for compression. Ended session with MLD to LUE.    PT Frequency 1x / week    PT Duration 4 weeks    PT Treatment/Interventions ADLs/Self Care Home Management;Patient/family education;Therapeutic exercise;Manual techniques;Manual lymph drainage;Passive range of motion;Scar mobilization    PT Next Visit Plan see if pt continued to have problems with sleeve, maybe velcro? and educate on best time to wear this (daily unless she is able to self manage with self MLD?); continue MLD to Lt breast/UE and she may be ready for D/C at next session.    PT Home Exercise Plan wear sports bra with chip pack; supine scapular series    Consulted and Agree with Plan of Care Patient           Patient will benefit from skilled therapeutic intervention in order to improve the following deficits and impairments:  Postural dysfunction, Increased edema, Decreased knowledge of precautions  Visit Diagnosis: Localized edema     Problem List Patient Active Problem List   Diagnosis Date Noted  . Trigeminal neuralgia of left side of face 01/26/2020  . Genetic testing 11/07/2019  . Malignant neoplasm of upper-outer quadrant of left breast in female, estrogen receptor positive ([place]) 10/26/2019  . Atrial fibrillation ([place]) 10/26/2019  . Chronic  anticoagulation 10/26/2019  . Morbid obesity with BMI of 40.0-44.9, adult ([place]) 10/26/2019  . Family history of breast cancer   . Family history of pancreatic cancer   . Family history of prostate cancer   .  Family history of melanoma     Allyson Sabal [facility] 02/20/2020, 4:17 PM  [place], [place], [address] Phone: [phone]   Fax:  [phone]  Name: Lisa Cardenas MRN: [id number] Date of Birth: 1950/09/28  Manus Gunning, PT 02/20/20 4:18 PM

## 2020-02-24 ENCOUNTER — Other Ambulatory Visit: Payer: Medicare Other

## 2020-02-26 ENCOUNTER — Inpatient Hospital Stay: Payer: Medicare Other | Admitting: Internal Medicine

## 2020-02-26 ENCOUNTER — Telehealth: Payer: Self-pay | Admitting: *Deleted

## 2020-02-26 NOTE — Telephone Encounter (Signed)
Spoke with patient about her upcoming appointment with Dr. Mickeal Skinner and Imaging.   She states that the facial pain has resolved approximately 2 weeks ago and wanted to know if the imaging was still warranted.    Per Dr. Mickeal Skinner no need to follow through with imaging and can just follow up with [place] as she needs.

## 2020-02-27 ENCOUNTER — Other Ambulatory Visit: Payer: Medicare Other

## 2020-02-28 ENCOUNTER — Encounter: Payer: Self-pay | Admitting: Physical Therapy

## 2020-02-28 ENCOUNTER — Ambulatory Visit: Payer: Medicare Other | Admitting: Physical Therapy

## 2020-02-28 ENCOUNTER — Other Ambulatory Visit: Payer: Self-pay

## 2020-02-28 ENCOUNTER — Telehealth: Payer: Self-pay

## 2020-02-28 DIAGNOSIS — R600 Localized edema: Secondary | ICD-10-CM

## 2020-02-28 DIAGNOSIS — R293 Abnormal posture: Secondary | ICD-10-CM

## 2020-02-28 NOTE — Therapy (Signed)
[place], [place], [address] Phone: [phone]   Fax:  [phone]  Physical Therapy Treatment  Patient Details  Name: Lisa Cardenas MRN: [id number] Date of Birth: [birth year] Referring Provider (PT): [place]   Encounter Date: 02/28/2020   PT End of Session - 02/28/20 1558    Visit Number 7    Number of Visits 6    Date for PT Re-Evaluation 03/04/20    PT Start Time 1505    PT Stop Time 1556    PT Time Calculation (min) 51 min    Activity Tolerance Patient tolerated treatment well    Behavior During Therapy [facility] for tasks assessed/performed           Past Medical History:  Diagnosis Date  . A-fib [facility])    no issues since May 2020  . Arrhythmia   . Cancer ([place])   . Diverticulosis   . Elevated cholesterol   . Family history of breast cancer   . Family history of melanoma   . Family history of pancreatic cancer   . Family history of prostate cancer   . Hypertension   . Obesity   . Pancreatitis   . Thyroid disease   . Trigeminal neuralgia   . Trigeminal neuralgia   . UTI (urinary tract infection)     Past Surgical History:  Procedure Laterality Date  . ABDOMINAL HYSTERECTOMY  1980  . BLADDER REPAIR    . BREAST LUMPECTOMY WITH RADIOACTIVE SEED AND SENTINEL LYMPH NODE BIOPSY Left 11/23/2019   Procedure: LEFT BREAST LUMPECTOMY WITH RADIOACTIVE SEED AND LEFT AXILLARY SENTINEL LYMPH NODE BIOPSY;  Surgeon: Rolm Bookbinder, MD;  Location: [place];  Service: General;  Laterality: Left;  PEC BLOCK  . COLONOSCOPY     [facility]  . ESOPHAGOGASTRODUODENOSCOPY     [facility]  . KNEE ARTHROSCOPY Left   . TONSILLECTOMY      There were no vitals filed for this visit.   Subjective Assessment - 02/28/20 1508    Subjective I am no longer having numbness in my hand when I wear the sleeve. I have been wearing the sleeve some. It is tolerable.    Pertinent History Left lumpectomy with SLNB  11/23/19,  with Dr. Donne Hazel most likely no chemotherapy and will have radiation. 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   Other history: A-fib, high cholesterol ,HTN, trigeminal neuralgia, essential tremors    Patient Stated Goals learn from all providers    Currently in Pain? No/denies    Pain Score 0-No pain                 LYMPHEDEMA/ONCOLOGY QUESTIONNAIRE - 02/28/20 0001      Left Upper Extremity Lymphedema   15 cm Proximal to Olecranon Process 41 cm    10 cm Proximal to Olecranon Process 38.1 cm    Olecranon Process 28.9 cm    10 cm Proximal to Ulnar Styloid Process 23 cm    Just Proximal to Ulnar Styloid Process 16.4 cm    Across Hand at [employer] 20 cm    At [place] of 2nd Digit 6.5 cm                      [facility] Adult PT Treatment/Exercise - 02/28/20 0001      Manual Therapy   Manual Therapy Manual Lymphatic Drainage (MLD);Soft tissue mobilization    Edema Management circumferential measurements taken  Soft tissue mobilization along R pec and pec minor where pt had increased tightness and sensitivty- pt was able to tolerate more pressure as session went on    Manual Lymphatic Drainage (MLD) short neck, 5 diaphragmatic breaths, left inguinal nodes and establishment of axillo inguinal pathway, right axillary nodes and establishment of inter axillary pathway, L UE workin proximal to distal moving fluid towards pathways then retracing all steps                       PT Long Term Goals - 01/08/20 1351      PT LONG TERM GOAL #1   Title Pt will receive compression sleeve for prophylactic use when she flies    Time 4    Period Weeks    Status New    Target Date 02/05/20      PT LONG TERM GOAL #2   Title Pt will be independent in self MLD for L breast to help decrease seroma    Time 4    Period Weeks    Status New    Target Date 02/05/20                 Plan - 02/28/20 1600    Clinical Impression Statement Pt  has been wearing her sleeve without numbness in her hand. She reports she does not wear it as often as she should but at least wears it every other day. Both measurements above olecranon had decreased this session. Pt is having tightness across left chest so performed soft tissue mobilization to this area followed by MLD to LUE.    PT Frequency 1x / week    PT Duration 4 weeks    PT Treatment/Interventions ADLs/Self Care Home Management;Patient/family education;Therapeutic exercise;Manual techniques;Manual lymph drainage;Passive range of motion;Scar mobilization    PT Next Visit Plan continue STM to L pec and pec minor, continue MLD to Lt breast/UE and she may be ready for D/C at next session.    PT Home Exercise Plan wear sports bra with chip pack; supine scapular series    Consulted and Agree with Plan of Care Patient           Patient will benefit from skilled therapeutic intervention in order to improve the following deficits and impairments:  Postural dysfunction, Increased edema, Decreased knowledge of precautions  Visit Diagnosis: Localized edema  Abnormal posture     Problem List Patient Active Problem List   Diagnosis Date Noted  . Trigeminal neuralgia of left side of face 01/26/2020  . Genetic testing 11/07/2019  . Malignant neoplasm of upper-outer quadrant of left breast in female, estrogen receptor positive ([place]) 10/26/2019  . Atrial fibrillation ([place]) 10/26/2019  . Chronic anticoagulation 10/26/2019  . Morbid obesity with BMI of 40.0-44.9, adult ([place]) 10/26/2019  . Family history of breast cancer   . Family history of pancreatic cancer   . Family history of prostate cancer   . Family history of melanoma     Allyson Sabal [facility] 02/28/2020, 4:03 PM  [place], [place], [address] Phone: [phone]   Fax:  [phone]  Name: Lisa Cardenas MRN: [id number] Date of Birth:  [birth year]  Manus Gunning, PT 02/28/20 4:03 PM

## 2020-02-28 NOTE — Telephone Encounter (Signed)
I called the patient today about her upcoming follow-up appointment in radiation oncology.   Given the state of the COVID-19 pandemic, concerning case numbers in our community, and guidance from [facility], I offered a phone assessment with the patient to determine if coming to the clinic was necessary. She accepted.  I let the patient know that I had spoken with Dr. Isidore Moos, and she wanted them to know the importance of washing their hands for at least 20 seconds at a time, especially after going out in public, and before they eat.  Limit going out in public whenever possible. Do not touch your face, unless your hands are clean, such as when bathing. Get plenty of rest, eat well, and stay hydrated. Patient verbalized understanding and agreement.  The patient denies any symptomatic concerns. She is still mildly fatigue, but states it continues to improve. She denies any chest pain, shortness of breath, or issues with range of motion to the left arm. She continues to follow up with out-patient PT/lymphedema clinic to help with swelling. Specifically, they report good healing of their skin in the radiation fields.  Skin is intact and almost back to her baseline coloring. I recommended that she continue skin care by applying oil or lotion with vitamin E to the skin in the radiation fields, BID, for 2 more months.    Continue follow-up with medical oncology - follow-up is scheduled on 05/13/2020 with Dr. Lurline Del.  I explained that yearly mammograms are important for patients with intact breast tissue, and physical exams are important after mastectomy for patients that cannot undergo mammography.  I encouraged her to call if she had further questions or concerns about her healing. Otherwise, she will follow-up PRN in radiation oncology. Patient is pleased with this plan, and we will cancel her upcoming follow-up to reduce the risk of COVID-19 transmission.

## 2020-02-29 ENCOUNTER — Encounter: Payer: Self-pay | Admitting: Oncology

## 2020-03-01 ENCOUNTER — Ambulatory Visit: Payer: Self-pay | Admitting: Radiation Oncology

## 2020-03-01 ENCOUNTER — Other Ambulatory Visit: Payer: Self-pay | Admitting: *Deleted

## 2020-03-01 MED ORDER — ANASTROZOLE 1 MG PO TABS
1.0000 mg | ORAL_TABLET | Freq: Every day | ORAL | 3 refills | Status: DC
Start: 2020-03-01 — End: 2020-07-04

## 2020-03-01 NOTE — Telephone Encounter (Signed)
Pt completed radiation and is now ready to start Anastrazole- prescription sent to patient's pharmacy.

## 2020-03-06 ENCOUNTER — Other Ambulatory Visit: Payer: Self-pay

## 2020-03-06 ENCOUNTER — Ambulatory Visit: Payer: Medicare Other | Admitting: Physical Therapy

## 2020-03-06 ENCOUNTER — Encounter: Payer: Self-pay | Admitting: Physical Therapy

## 2020-03-06 DIAGNOSIS — R600 Localized edema: Secondary | ICD-10-CM

## 2020-03-06 DIAGNOSIS — R293 Abnormal posture: Secondary | ICD-10-CM

## 2020-03-06 NOTE — Therapy (Signed)
[place], [place], [address] Phone: [phone]   Fax:  [phone]  Physical Therapy Treatment  Patient Details  Name: Lisa Cardenas MRN: [id number] Date of Birth: 1950-08-27 Referring Provider (PT): [place]   Encounter Date: 03/06/2020   PT End of Session - 03/06/20 1209    Visit Number 8    Number of Visits 6    Date for PT Re-Evaluation 03/04/20    PT Start Time 1110    PT Stop Time 1200    PT Time Calculation (min) 50 min    Activity Tolerance Patient tolerated treatment well    Behavior During Therapy [facility] for tasks assessed/performed           Past Medical History:  Diagnosis Date   A-fib [facility])    no issues since May 2020   Arrhythmia    Cancer ([place])    Diverticulosis    Elevated cholesterol    Family history of breast cancer    Family history of melanoma    Family history of pancreatic cancer    Family history of prostate cancer    Hypertension    Obesity    Pancreatitis    Thyroid disease    Trigeminal neuralgia    Trigeminal neuralgia    UTI (urinary tract infection)     Past Surgical History:  Procedure Laterality Date   ABDOMINAL HYSTERECTOMY  1980   BLADDER REPAIR     BREAST LUMPECTOMY WITH RADIOACTIVE SEED AND SENTINEL LYMPH NODE BIOPSY Left 11/23/2019   Procedure: LEFT BREAST LUMPECTOMY WITH RADIOACTIVE SEED AND LEFT AXILLARY SENTINEL LYMPH NODE BIOPSY;  Surgeon: Rolm Bookbinder, MD;  Location: [place];  Service: General;  Laterality: Left;  PEC BLOCK   COLONOSCOPY     [facility]   ESOPHAGOGASTRODUODENOSCOPY     [facility]   KNEE ARTHROSCOPY Left    TONSILLECTOMY      There were no vitals filed for this visit.   Subjective Assessment - 03/06/20 1111    Subjective I am so tired. I do not sleep at night. I went to sleep at 4:30.    Pertinent History Left lumpectomy with SLNB 11/23/19,  with Dr. Donne Hazel most likely no chemotherapy  and will have radiation. 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   Other history: A-fib, high cholesterol ,HTN, trigeminal neuralgia, essential tremors    Patient Stated Goals learn from all providers    Currently in Pain? No/denies    Pain Score 0-No pain                 LYMPHEDEMA/ONCOLOGY QUESTIONNAIRE - 03/06/20 0001      Left Upper Extremity Lymphedema   15 cm Proximal to Olecranon Process 42 cm    10 cm Proximal to Olecranon Process 38 cm    Olecranon Process 28.9 cm    10 cm Proximal to Ulnar Styloid Process 23.5 cm    Just Proximal to Ulnar Styloid Process 16.8 cm    Across Hand at [employer] 20 cm    At [place] of 2nd Digit 6.5 cm                      [facility] Adult PT Treatment/Exercise - 03/06/20 0001      Manual Therapy   Edema Management circumferential measurements taken    Soft tissue mobilization along R pec and pec minor and along R posterior upper arm where pt had increased  tightness and tenderness                       PT Long Term Goals - 03/06/20 1114      PT LONG TERM GOAL #1   Title Pt will receive compression sleeve for prophylactic use when she flies    Baseline 03/06/20- pt has been wearing her compression sleeve and is not having any issues    Time 4    Period Weeks    Status Achieved      PT LONG TERM GOAL #2   Title Pt will be independent in self MLD for L breast to help decrease seroma    Baseline 03/06/20- pt is independent with this    Time 4    Period Weeks    Status Achieved                 Plan - 03/06/20 1202    Clinical Impression Statement Pt has now met all goals for therapy and is ready for discharge. She has an approriate sleeve for long term management of edema. Performed manual therapy to R chest and posterior upper arm to decrease tightness and soreness.    Comorbidities will be having radiation and SLNB    PT Frequency 1x / week    PT Duration 4 weeks    PT  Treatment/Interventions ADLs/Self Care Home Management;Patient/family education;Therapeutic exercise;Manual techniques;Manual lymph drainage;Passive range of motion;Scar mobilization    PT Next Visit Plan d/c this visit    PT Home Exercise Plan wear sports bra with chip pack; supine scapular series    Consulted and Agree with Plan of Care Patient           Patient will benefit from skilled therapeutic intervention in order to improve the following deficits and impairments:  Postural dysfunction, Increased edema, Decreased knowledge of precautions  Visit Diagnosis: Localized edema  Abnormal posture     Problem List Patient Active Problem List   Diagnosis Date Noted   Trigeminal neuralgia of left side of face 01/26/2020   Genetic testing 11/07/2019   Malignant neoplasm of upper-outer quadrant of left breast in female, estrogen receptor positive ([place]) 10/26/2019   Atrial fibrillation ([place]) 10/26/2019   Chronic anticoagulation 10/26/2019   Morbid obesity with BMI of 40.0-44.9, adult ([place]) 10/26/2019   Family history of breast cancer    Family history of pancreatic cancer    Family history of prostate cancer    Family history of melanoma     Manus Gunning 03/06/2020, 12:14 PM  [place], [place], [address] Phone: [phone]   Fax:  [phone]  Name: Lisa Cardenas MRN: [id number] Date of Birth: 04-11-1951  Manus Gunning, PT 03/06/20 12:14 PM  PHYSICAL THERAPY DISCHARGE SUMMARY  Visits from Start of Care: 8  Current functional level related to goals / functional outcomes: All goals met   Remaining deficits: None   Education / Equipment: HEP, compression  Plan: Patient agrees to discharge.  Patient goals were not met. Patient is being discharged due to meeting the stated rehab goals.  ?????   Allyson Sabal [place], [place] 03/06/20 12:16 PM

## 2020-03-15 NOTE — Progress Notes (Signed)
  Patient Name: Lisa Cardenas MRN: [id number] DOB: [birth year] Referring Physician: Lurline Del (Profile Not Attached) Date of Service: 01/24/2020 [facility], [place]                                                        End Of Treatment Note  Diagnoses: C50.412-Malignant neoplasm of upper-outer quadrant of left female breast  Cancer Staging: Cancer Staging Malignant neoplasm of upper-outer quadrant of left breast in female, estrogen receptor positive ([place]) Staging form: Breast, AJCC 8th Edition - Clinical: Stage IA (cT1a, cN0, cM0, G2, ER+, PR+, HER2-) - Signed by Gardenia Phlegm, NP on 11/01/2019 - Pathologic stage from 11/23/2019: Stage IA (pT1b, pN0, cM0, G2, ER+, PR+, HER2-) - Signed by Gardenia Phlegm, NP on 12/13/2019  Intent: Curative  Radiation Treatment Dates: 01/02/2020 through 01/24/2020 Site Technique Total Dose (Gy) Dose per Fx (Gy) Completed Fx Beam Energies  Breast, Left: Breast_Lt 3D 42.56/42.56 2.66 16/16 10X, 15X   Narrative: The patient tolerated radiation therapy relatively well.   Plan: The patient will follow-up with radiation oncology in 1 mo, or as needed.  -----------------------------------  Eppie Gibson, MD

## 2020-03-27 ENCOUNTER — Encounter: Payer: Self-pay | Admitting: Oncology

## 2020-04-01 ENCOUNTER — Encounter: Payer: Self-pay | Admitting: Oncology

## 2020-04-09 ENCOUNTER — Other Ambulatory Visit: Payer: Self-pay | Admitting: Radiation Therapy

## 2020-04-16 ENCOUNTER — Other Ambulatory Visit: Payer: Self-pay | Admitting: Internal Medicine

## 2020-04-16 ENCOUNTER — Telehealth: Payer: Self-pay

## 2020-04-16 MED ORDER — GABAPENTIN 400 MG PO CAPS: 800.0000 mg | ORAL_CAPSULE | Freq: Two times a day (BID) | ORAL | 2 refills | Status: DC

## 2020-04-16 NOTE — Telephone Encounter (Signed)
Patient called office stating that her "trigeminal pain" has become worse over the last 2 weeks. Patient states pain is not constant, but feels like a "stabbing pain". Patient states she has been taking 300mg  neurontin 3-4 times daily instead of 2 times daily as prescribed. Patient also states she has been taking hot showers to see if it would give her some pain relieve. Dr. Mickeal Skinner made aware. New prescription sent to patient's pharmacy with increased neurontin dose. Patient informed of new prescription and verbalized understanding. Patient is scheduled for MRI and MD follow-up in 2 weeks. Patient instructed to call office if pain has not improved within a couple of day. Patient verbalized understanding. All questions were answered during phone call.

## 2020-04-18 ENCOUNTER — Telehealth: Payer: Self-pay | Admitting: *Deleted

## 2020-04-18 ENCOUNTER — Other Ambulatory Visit: Payer: Self-pay | Admitting: Internal Medicine

## 2020-04-18 MED ORDER — SUMATRIPTAN SUCCINATE 3 MG/0.5ML ~~LOC~~ SOAJ: 3.0000 mg | Freq: Once | SUBCUTANEOUS | 3 refills | Status: DC

## 2020-04-18 NOTE — Telephone Encounter (Signed)
Patient and her daughter called requesting a call back.  Patient went to [facility] ER for severe pain related to Trigeminal Neuralgia.  She was referred to Neurology Headache Specialist and that office isn't able to see her until January 2022.  Next day appointment was scheduled to see Dr. Mickeal Skinner for continued pain and alternative solutions.

## 2020-04-19 ENCOUNTER — Ambulatory Visit: Payer: Medicare Other | Admitting: Internal Medicine

## 2020-04-19 ENCOUNTER — Other Ambulatory Visit: Payer: Self-pay

## 2020-04-19 ENCOUNTER — Telehealth: Payer: Self-pay | Admitting: Medical Oncology

## 2020-04-19 ENCOUNTER — Inpatient Hospital Stay: Payer: Medicare Other | Attending: Oncology | Admitting: Internal Medicine

## 2020-04-19 VITALS — BP 152/74 | HR 88 | Temp 97.1°F | Resp 20 | Ht 64.0 in | Wt 245.5 lb

## 2020-04-19 DIAGNOSIS — Z808 Family history of malignant neoplasm of other organs or systems: Secondary | ICD-10-CM | POA: Diagnosis not present

## 2020-04-19 DIAGNOSIS — Z803 Family history of malignant neoplasm of breast: Secondary | ICD-10-CM | POA: Insufficient documentation

## 2020-04-19 DIAGNOSIS — G500 Trigeminal neuralgia: Secondary | ICD-10-CM | POA: Diagnosis present

## 2020-04-19 DIAGNOSIS — Z806 Family history of leukemia: Secondary | ICD-10-CM | POA: Diagnosis not present

## 2020-04-19 DIAGNOSIS — Z170 Estrogen receptor positive status [ER+]: Secondary | ICD-10-CM | POA: Insufficient documentation

## 2020-04-19 DIAGNOSIS — C50412 Malignant neoplasm of upper-outer quadrant of left female breast: Secondary | ICD-10-CM | POA: Insufficient documentation

## 2020-04-19 DIAGNOSIS — Z800 Family history of malignant neoplasm of digestive organs: Secondary | ICD-10-CM | POA: Diagnosis not present

## 2020-04-19 DIAGNOSIS — Z90710 Acquired absence of both cervix and uterus: Secondary | ICD-10-CM | POA: Diagnosis not present

## 2020-04-19 MED ORDER — DEXAMETHASONE 4 MG PO TABS
4.0000 mg | ORAL_TABLET | Freq: Every day | ORAL | 0 refills | Status: DC
Start: 2020-04-19 — End: 2021-05-15

## 2020-04-19 MED ORDER — OXCARBAZEPINE 300 MG PO TABS: 600.0000 mg | ORAL_TABLET | Freq: Two times a day (BID) | ORAL | 2 refills | Status: DC

## 2020-04-19 MED ORDER — INDOMETHACIN 25 MG PO CAPS
25.0000 mg | ORAL_CAPSULE | Freq: Three times a day (TID) | ORAL | 0 refills | Status: DC
Start: 2020-04-19 — End: 2021-05-19

## 2020-04-19 NOTE — Progress Notes (Signed)
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 No pathogenic variants identified on the [facility] Breast Cancer STAT Panel + Common Hereditary Cancers Panel. The report date is 11/03/2019.  The STAT Breast cancer panel offered by [facility] includes sequencing and rearrangement analysis for the following 9 genes:  ATM, BRCA1, BRCA2, CDH1, CHEK2, PALB2, PTEN, STK11 and TP53.    The Common Hereditary Cancers Panel offered by [facility] includes sequencing and/or deletion duplication testing of the following 48 genes: APC, ATM, AXIN2, BARD1, BMPR1A, BRCA1, BRCA2, BRIP1, CDH1, CDKN2A (p14ARF), CDKN2A (p16INK4a), CKD4, CHEK2, CTNNA1, DICER1, EPCAM (Deletion/duplication testing only), GREM1 (promoter region deletion/duplication testing only), KIT, MEN1, MLH1, MSH2, MSH3, MSH6, MUTYH, NBN, NF1, NHTL1, PALB2, PDGFRA, PMS2, POLD1, POLE, PTEN, RAD50, RAD51C, RAD51D, RNF43, SDHB, SDHC, SDHD, SMAD4, SMARCA4. STK11, TP53, TSC1, TSC2, and VHL.  The following genes were evaluated for sequence changes only: SDHA and HOXB13 c.251G>A variant only.   11/23/2019  Cancer Staging   Staging form: Breast, AJCC 8th Edition - Pathologic stage from 11/23/2019: Stage IA (pT1b, pN0, cM0, G2, ER+, PR+, HER2-) - Signed by Gardenia Phlegm, NP on 12/13/2019     Interval History:  Lisa Cardenas presents today with severe breakthrough facial pain, consistent with prior trigeminal neuralgia.  She describes pain episodes occurring every 2-3 minutes, lasting for seconds.  Symptoms have interfered with her life drastically, including ability to eat and drink.  Pain is worsened by laying flat or even with slight jarring/vibration from walking.  She took oxycodone which was not effective.  She does have referral to [facility] but not until January 2022. MRI scan scheduled for 04/30/20.  H+P (01/26/20) Patient presents to discuss facial pain syndrome.  She describes episodes of "lightning-type pain" lasting several seconds, affecting the mid and lower half of left side of face.  This started several years ago, around the time of cancer diagnosis.  She was started at that time on Tegretol by her family physician, which was very effective.  More recently she has had recurrence of pain, although character is somewhat different.  She now describes it more like "pins and needles sharp pain" that is more continuous, affects left forehead.  There is a "hotspot" on her forehead that if touched can reproduce the symptoms.  Now taking 239m of tegretol and 3060mof gabapentin twice per day. Also describes ongoing essential tremors for which she has been taking Primodone 5050maily.  Medications: Current Outpatient Medications on File Prior to Visit  Medication Sig Dispense Refill  . anastrozole (ARIMIDEX) 1 MG tablet Take 1 tablet (1 mg total) by mouth daily. 30 tablet 3  . carbamazepine (TEGRETOL) 200 MG tablet  Take 200 mg by mouth daily with breakfast.     . celecoxib (CELEBREX) 200 MG capsule Take 200 mg by mouth every 30 (thirty) days.    Marland Kitchen esomeprazole (NEXIUM) 40 MG  capsule Take 40 mg by mouth 2 (two) times daily as needed.    . gabapentin (NEURONTIN) 400 MG capsule Take 2 capsules (800 mg total) by mouth 2 (two) times daily. One in late morning and one at night 120 capsule 2  . levothyroxine (SYNTHROID) 137 MCG tablet Take 137 mcg by mouth daily. Current dose 137 mcg daily    . LORazepam (ATIVAN) 0.5 MG tablet Take 0.5 mg by mouth as needed for anxiety.    . metoprolol succinate (TOPROL-XL) 100 MG 24 hr tablet Take 100 mg by mouth 2 (two) times daily.    Marland Kitchen olmesartan-hydrochlorothiazide (BENICAR HCT) 20-12.5 MG tablet Take 1 tablet by mouth daily.    . Oxcarbazepine (TRILEPTAL) 300 MG tablet Take 1 tablet (300 mg total) by mouth 2 (two) times daily. 60 tablet 3  . primidone (MYSOLINE) 50 MG tablet Take 50 mg by mouth daily.     . rivaroxaban (XARELTO) 20 MG TABS tablet Take 20 mg by mouth daily.    . sertraline (ZOLOFT) 50 MG tablet Take 50 mg by mouth daily.    . simvastatin (ZOCOR) 40 MG tablet Take 40 mg by mouth daily.    . SUMAtriptan Succinate 3 MG/0.5ML SOAJ Inject 3 mg into the skin once for 1 dose. 0.5 mL 3  . Vitamin D, Ergocalciferol, (DRISDOL) 1.25 MG (50000 UNIT) CAPS capsule Take 50,000 Units by mouth once a week.     No current facility-administered medications on file prior to visit.    Allergies:  Allergies  Allergen Reactions  . Plaquenil [Hydroxychloroquine]     Increased heart rate   Past Medical History:  Past Medical History:  Diagnosis Date  . A-fib [facility])    no issues since May 2020  . Arrhythmia   . Cancer ([place])   . Diverticulosis   . Elevated cholesterol   . Family history of breast cancer   . Family history of melanoma   . Family history of pancreatic cancer   . Family history of prostate cancer   . Hypertension   . Obesity   . Pancreatitis   . Thyroid disease   . Trigeminal neuralgia   . Trigeminal neuralgia   . UTI (urinary tract infection)    Past Surgical History:  Past Surgical History:  Procedure  Laterality Date  . ABDOMINAL HYSTERECTOMY  1980  . BLADDER REPAIR    . BREAST LUMPECTOMY WITH RADIOACTIVE SEED AND SENTINEL LYMPH NODE BIOPSY Left 11/23/2019   Procedure: LEFT BREAST LUMPECTOMY WITH RADIOACTIVE SEED AND LEFT AXILLARY SENTINEL LYMPH NODE BIOPSY;  Surgeon: Rolm Bookbinder, MD;  Location: [place];  Service: General;  Laterality: Left;  PEC BLOCK  . COLONOSCOPY     [facility]  . ESOPHAGOGASTRODUODENOSCOPY     [facility]  . KNEE ARTHROSCOPY Left   . TONSILLECTOMY     Social History:  Social History   Socioeconomic History  . Marital status: Married    Spouse name: Not on file  . Number of children: Not on file  . Years of education: Not on file  . Highest education level: Not on file  Occupational History  . Not on file  Tobacco Use  . Smoking status: Never Smoker  . Smokeless tobacco: Never Used  Vaping Use  . Vaping Use:  Never used  Substance and Sexual Activity  . Alcohol use: Not Currently  . Drug use: Not Currently  . Sexual activity: Not on file  Other Topics Concern  . Not on file  Social History Narrative  . Not on file   Social Determinants of Health   Financial Resource Strain:   . Difficulty of Paying Living Expenses: Not on file  Food Insecurity:   . Worried About Charity fundraiser in the Last Year: Not on file  . Ran Out of Food in the Last Year: Not on file  Transportation Needs:   . Lack of Transportation (Medical): Not on file  . Lack of Transportation (Non-Medical): Not on file  Physical Activity:   . Days of Exercise per Week: Not on file  . Minutes of Exercise per Session: Not on file  Stress:   . Feeling of Stress : Not on file  Social Connections:   . Frequency of Communication with Friends and Family: Not on file  . Frequency of Social Gatherings with Friends and Family: Not on file  . Attends Religious Services: Not on file  . Active Member of Clubs or Organizations: Not on file  . Attends Archivist Meetings:  Not on file  . Marital Status: Not on file  Intimate Partner Violence:   . Fear of Current or Ex-Partner: Not on file  . Emotionally Abused: Not on file  . Physically Abused: Not on file  . Sexually Abused: Not on file   Family History:  Family History  Problem Relation Age of Onset  . Colon cancer Mother        dx 75  . Breast cancer Maternal Grandmother   . Breast cancer Maternal Aunt   . Prostate cancer Maternal Uncle   . Breast cancer Paternal Aunt   . Melanoma Paternal Uncle   . Breast cancer Maternal Aunt   . Leukemia Maternal Uncle   . Breast cancer Paternal Aunt   . Colon cancer Paternal Aunt   . Melanoma Daughter     Review of Systems: Constitutional: Doesn't report fevers, chills or abnormal weight loss Eyes: Doesn't report blurriness of vision Ears, nose, mouth, throat, and face: Doesn't report sore throat Respiratory: Doesn't report cough, dyspnea or wheezes Cardiovascular: Doesn't report palpitation, chest discomfort  Gastrointestinal:  Doesn't report nausea, constipation, diarrhea GU: Doesn't report incontinence Skin: Doesn't report skin rashes Neurological: Per HPI Musculoskeletal: Doesn't report joint pain Behavioral/Psych: Doesn't report anxiety  Physical Exam: Vitals:   04/19/20 1051  BP: (!) 152/74  Pulse: 88  Resp: 20  Temp: (!) 97.1 F (36.2 C)  SpO2: 97%   KPS: 90. General: Alert, cooperative, pleasant, in no acute distress Head: Normal EENT: No conjunctival injection or scleral icterus.  Lungs: Resp effort normal Cardiac: Regular rate Abdomen: Non-distended abdomen Skin: No rashes cyanosis or petechiae. Extremities: No clubbing or edema  Neurologic Exam: Mental Status: Awake, alert, attentive to examiner. Oriented to self and environment. Language is fluent with intact comprehension.  Cranial Nerves: Visual acuity is grossly normal. Visual fields are full. Extra-ocular movements intact. No ptosis. Face is symmetric Motor: Tone and  bulk are normal. Power is full in both arms and legs.  Sensory: Intact to light touch Gait: Normal.   Labs: I have reviewed the data as listed    Component Value Date/Time   NA 138 11/20/2019 1100   K 4.0 11/20/2019 1100   CL 101 11/20/2019 1100   CO2 27 11/20/2019 1100  GLUCOSE 100 (H) 11/20/2019 1100   BUN 15 11/20/2019 1100   CREATININE 0.76 11/20/2019 1100   CREATININE 0.78 10/26/2019 1507   CALCIUM 9.8 11/20/2019 1100   PROT 7.4 10/26/2019 1507   ALBUMIN 4.1 10/26/2019 1507   AST 11 (L) 10/26/2019 1507   ALT 15 10/26/2019 1507   ALKPHOS 79 10/26/2019 1507   BILITOT 0.4 10/26/2019 1507   GFRNONAA >60 11/20/2019 1100   GFRNONAA >60 10/26/2019 1507   GFRAA >60 11/20/2019 1100   GFRAA >60 10/26/2019 1507   Lab Results  Component Value Date   WBC 6.7 10/26/2019   NEUTROABS 4.6 10/26/2019   HGB 14.2 10/26/2019   HCT 44.0 10/26/2019   MCV 87.0 10/26/2019   PLT 219 10/26/2019     Assessment/Plan Trigeminal neuralgia of left side of face [G50.0]  DAVIA SMYRE presents with refractory facial pain syndrome, consistent with either primary trigeminal neuralgia or trigeminal neuropathy, or SUNCT syndrome.    Recommended titration of Trileptal 381m BID x3 days, then 300/600 x3 days, then 6046mBID.  She may continue to dose Gabapentin 30025mID as well.  In addition, will dose trial of dexamethasone 4mg75mily x7 days to alleviate acute symptoms. If that is ineffective we have also provided script for indomethacin titration; 25mg48m x3 days, then 50mg 30mx3 days, then 75mg T31m3 days, then STOP if ineffective.  She has MRI brain with MRA head scheduled for 10/19 to assess surgical candidacy.   Will continue to follow closely with her as we try to get control of the acute pain.    We appreciate the opportunity to participate in the care of Rhoda LAURE LEONE change follow up to 10/21 for closer visit following MRI scan.  All questions were answered. The patient  knows to call the clinic with any problems, questions or concerns. No barriers to learning were detected.  The total time spent in the encounter was 40 minutes and more than 50% was on counseling and review of test results   ZacharyVentura Sellersdical Director of Neuro-Oncology [facility] [place]21 10:46 AM

## 2020-04-19 NOTE — Telephone Encounter (Addendum)
Oxcarbazepine-concerned about what to do after today"s  dose change ?   New med prescriptions-Decadron and indomethacin prescriptions  are not at her pharmacy. I told her to check back with  her pharmacy in 2-3  hours.  Next appt 10/26.

## 2020-04-21 ENCOUNTER — Other Ambulatory Visit: Payer: Self-pay | Admitting: Internal Medicine

## 2020-04-22 ENCOUNTER — Telehealth: Payer: Self-pay | Admitting: Internal Medicine

## 2020-04-22 ENCOUNTER — Other Ambulatory Visit: Payer: Self-pay | Admitting: *Deleted

## 2020-04-22 DIAGNOSIS — G500 Trigeminal neuralgia: Secondary | ICD-10-CM

## 2020-04-22 NOTE — Telephone Encounter (Signed)
Scheduled per 10/11 los. Pt is aware of appt on 10/21.

## 2020-04-24 ENCOUNTER — Telehealth: Payer: Self-pay | Admitting: Internal Medicine

## 2020-04-24 ENCOUNTER — Other Ambulatory Visit: Payer: Self-pay

## 2020-04-24 ENCOUNTER — Ambulatory Visit (HOSPITAL_COMMUNITY)
Admission: RE | Admit: 2020-04-24 | Discharge: 2020-04-24 | Disposition: A | Discharge status: Home / Self Care | Payer: Medicare Other | Source: Ambulatory Visit | Attending: Internal Medicine | Admitting: Internal Medicine

## 2020-04-24 DIAGNOSIS — G500 Trigeminal neuralgia: Secondary | ICD-10-CM | POA: Diagnosis not present

## 2020-04-24 MED ORDER — GADOBUTROL 1 MMOL/ML IV SOLN
9.0000 mL | Freq: Once | INTRAVENOUS | Status: AC | PRN
  Administered 2020-04-24: 9 mL via INTRAVENOUS

## 2020-04-24 NOTE — Telephone Encounter (Signed)
Contacted patient's spouse to verify mychart video visit for pre reg

## 2020-04-25 ENCOUNTER — Inpatient Hospital Stay (HOSPITAL_BASED_OUTPATIENT_CLINIC_OR_DEPARTMENT_OTHER): Payer: Medicare Other | Admitting: Internal Medicine

## 2020-04-25 DIAGNOSIS — G500 Trigeminal neuralgia: Secondary | ICD-10-CM | POA: Diagnosis not present

## 2020-04-25 NOTE — Progress Notes (Signed)
I connected with Lisa Cardenas on 04/25/20 at 12:30 PM EDT by telephone visit and verified that I am speaking with the correct person using two identifiers.  I discussed the limitations, risks, security and privacy concerns of performing an evaluation and management service by telemedicine and the availability of in-person appointments. I also discussed with the patient that there may be a patient responsible charge related to this service. The patient expressed understanding and agreed to proceed.  Other persons participating in the visit and their role in the encounter:  n/a  Patient's location:  Hospital  Provider's location:  Office  Chief Complaint:  Trigeminal neuralgia of left side of face  History of Present Ilness: Lisa Cardenas unfortunately presented to the emergency department at [facility] with lethargy and confusion, was found to have significant hyponatremia, possibly secondary to Trileptal.  She feels improved today getting IV fluids and slow correction.  Facial pain symptoms are unchanged, continue as prior.  Unclear if the steroids were helpful either.  Was able to complete MRI yesterday.  Observations: Language and cognition at baseline  Imaging:  MR ANGIO HEAD WO CONTRAST  Result Date: 04/24/2020 CLINICAL DATA:  Trigeminal neuralgia, headache. EXAM: MRI HEAD WITHOUT AND WITH CONTRAST MRA HEAD WITHOUT CONTRAST TECHNIQUE: Multiplanar, multiecho pulse sequences of the brain and surrounding structures were obtained without and with intravenous contrast. Angiographic images of the head were obtained using MRA technique without contrast. CONTRAST:  52mL GADAVIST GADOBUTROL 1 MMOL/ML IV SOLN COMPARISON:  None. FINDINGS: MRI HEAD FINDINGS Brain: No diffusion-weighted signal abnormality. Remote right frontal microhemorrhage. No midline shift, ventriculomegaly or extra-axial fluid collection. No mass lesion. Cerebral volume is within normal limits. Scattered T2/FLAIR hyperintense foci  involving the subcortical and periventricular white matter. Normal appearance of the cerebellopontine angles and internal auditory canals. The seventh and eighth cranial nerves are distinct within the IACs. Normal bilateral inner ear morphology. The bilateral trigeminal nerves demonstrate normal morphology and signal intensity. The left superior cerebellar artery abuts the proximal cisternal segment of the left trigeminal nerve (8:209). Normal appearance of the bilateral [place]. Vascular: Please see MRA head for further evaluation of the major intracranial vessels. Skull and upper cervical spine: Normal marrow signal. Sinuses/Orbits: Bilateral optic nerve sheath distension. Otherwise normal orbits. Clear paranasal sinuses and mastoid air cells. Other: Partially imaged 1.5 x 0.7 cm deep right parotid lesion (12:2). MRA HEAD FINDINGS Anterior circulation: Patent normal caliber appearance of the ICAs, anterior and middle cerebral arteries. No significant stenosis, proximal occlusion, aneurysm, or vascular malformation. Posterior circulation: Normal appearance of the bilateral V4 segments. Proximally patent PICA. The basilar, superior cerebellar and posterior cerebral arteries demonstrate normal caliber and patency. Fetal origin of the left PCA. Right PCOM hypoplasia. No significant stenosis, proximal occlusion, aneurysm, or vascular malformation. Venous sinuses: As permitted by contrast timing, patent. Anatomic variants: Detailed above. IMPRESSION: MRI head: Left superior cerebellar artery abuts the proximal cisternal segment of the left trigeminal nerve. Minimal to mild chronic microvascular ischemic changes. No abnormal enhancement. Mild bilateral papilledema. 1.5 cm partially imaged right parotid lesion. Consider maxillofacial CT with contrast for complete evaluation. Differential includes pleomorphic adenoma, intraparotid node and primary neoplasm. MRA head: No high-grade narrowing, large vessel occlusion,  dissection or aneurysm. Electronically Signed   By: Primitivo Gauze M.D.   On: 04/24/2020 17:18   MR Brain W Wo Contrast  Result Date: 04/24/2020 CLINICAL DATA:  Trigeminal neuralgia, headache. EXAM: MRI HEAD WITHOUT AND WITH CONTRAST MRA HEAD WITHOUT CONTRAST TECHNIQUE: Multiplanar, multiecho  pulse sequences of the brain and surrounding structures were obtained without and with intravenous contrast. Angiographic images of the head were obtained using MRA technique without contrast. CONTRAST:  10mL GADAVIST GADOBUTROL 1 MMOL/ML IV SOLN COMPARISON:  None. FINDINGS: MRI HEAD FINDINGS Brain: No diffusion-weighted signal abnormality. Remote right frontal microhemorrhage. No midline shift, ventriculomegaly or extra-axial fluid collection. No mass lesion. Cerebral volume is within normal limits. Scattered T2/FLAIR hyperintense foci involving the subcortical and periventricular white matter. Normal appearance of the cerebellopontine angles and internal auditory canals. The seventh and eighth cranial nerves are distinct within the IACs. Normal bilateral inner ear morphology. The bilateral trigeminal nerves demonstrate normal morphology and signal intensity. The left superior cerebellar artery abuts the proximal cisternal segment of the left trigeminal nerve (8:209). Normal appearance of the bilateral [place]. Vascular: Please see MRA head for further evaluation of the major intracranial vessels. Skull and upper cervical spine: Normal marrow signal. Sinuses/Orbits: Bilateral optic nerve sheath distension. Otherwise normal orbits. Clear paranasal sinuses and mastoid air cells. Other: Partially imaged 1.5 x 0.7 cm deep right parotid lesion (12:2). MRA HEAD FINDINGS Anterior circulation: Patent normal caliber appearance of the ICAs, anterior and middle cerebral arteries. No significant stenosis, proximal occlusion, aneurysm, or vascular malformation. Posterior circulation: Normal appearance of the bilateral V4  segments. Proximally patent PICA. The basilar, superior cerebellar and posterior cerebral arteries demonstrate normal caliber and patency. Fetal origin of the left PCA. Right PCOM hypoplasia. No significant stenosis, proximal occlusion, aneurysm, or vascular malformation. Venous sinuses: As permitted by contrast timing, patent. Anatomic variants: Detailed above. IMPRESSION: MRI head: Left superior cerebellar artery abuts the proximal cisternal segment of the left trigeminal nerve. Minimal to mild chronic microvascular ischemic changes. No abnormal enhancement. Mild bilateral papilledema. 1.5 cm partially imaged right parotid lesion. Consider maxillofacial CT with contrast for complete evaluation. Differential includes pleomorphic adenoma, intraparotid node and primary neoplasm. MRA head: No high-grade narrowing, large vessel occlusion, dissection or aneurysm. Electronically Signed   By: Primitivo Gauze M.D.   On: 04/24/2020 17:18   Assessment and Plan: Trigeminal neuralgia of left side of face  Follow Up Instructions: MRI demonstrates vascular loop affecting L trigeminal nerve root which could be targeted operatively.  She would prefer to stay at [facility] for surgery, and [facility] team will make arrangements accordingly.  Will continue to follow only as needed.    I discussed the assessment and treatment plan with the patient.  The patient was provided an opportunity to ask questions and all were answered.  The patient agreed with the plan and demonstrated understanding of the instructions.    The patient was advised to call back or seek an in-person evaluation if the symptoms worsen or if the condition fails to improve as anticipated.  I provided 5-10 minutes of non-face-to-face time during this enocunter.  Ventura Sellers, MD   I provided 15 minutes of non face-to-face telephone visit time during this encounter, and > 50% was spent counseling as documented under my assessment & plan.

## 2020-04-30 ENCOUNTER — Other Ambulatory Visit: Payer: Medicare Other

## 2020-04-30 ENCOUNTER — Ambulatory Visit
Admission: RE | Admit: 2020-04-30 | Discharge: 2020-04-30 | Disposition: A | Discharge status: Home / Self Care | Payer: Medicare Other | Source: Ambulatory Visit | Attending: Oncology | Admitting: Oncology

## 2020-04-30 ENCOUNTER — Other Ambulatory Visit: Payer: Self-pay

## 2020-04-30 ENCOUNTER — Other Ambulatory Visit: Payer: Self-pay | Admitting: Oncology

## 2020-04-30 DIAGNOSIS — Z170 Estrogen receptor positive status [ER+]: Secondary | ICD-10-CM

## 2020-04-30 DIAGNOSIS — Z6841 Body Mass Index (BMI) 40.0 and over, adult: Secondary | ICD-10-CM

## 2020-04-30 DIAGNOSIS — C50412 Malignant neoplasm of upper-outer quadrant of left female breast: Secondary | ICD-10-CM

## 2020-05-02 ENCOUNTER — Ambulatory Visit: Payer: Medicare Other | Admitting: Internal Medicine

## 2020-05-07 ENCOUNTER — Ambulatory Visit: Payer: Medicare Other | Admitting: Internal Medicine

## 2020-05-10 NOTE — Progress Notes (Signed)
[place]  [phone] [phone]) [phone]     ID: Lisa Cardenas DOB: April 20, 1951  MR#: [id number]  ZHY#:[id number]  Patient Care Team: Moshe Cipro, MD as PCP - General (Internal Medicine) Rolm Bookbinder, MD as Consulting Physician (General Surgery) Ned Kakar, Virgie Dad, MD as Consulting Physician (Oncology) Pyrtle, Lajuan Lines, MD as Consulting Physician (Gastroenterology) Rockwell Germany, RN as Oncology Nurse Navigator Mauro Kaufmann, RN as Oncology Nurse Navigator Zagol, Barnie Alderman, MD as Referring Physician (Cardiology) Chauncey Cruel, MD OTHER MD:  CHIEF COMPLAINT: Estrogen receptor positive lobular breast cancer  CURRENT TREATMENT: Anastrozole   INTERVAL HISTORY: Lisa Cardenas" returns today for follow up of her estrogen receptor positive lobular breast cancer.   Since her last visit, she received radiation therapy under Dr. Isidore Moos from 01/02/2020 through 01/24/2020.  She began anastrozole on 02/26/2020.  She has mild hot flashes, not every day, and these do not wake her up at least not frequently.  Vaginal dryness is not a significant issue.  She does have some dry skin.  Since her last visit, she underwent angio head and brain MRI on 04/24/2020 for follow up of her trigeminal neuralgia showing: left superior cerebellar artery abuts proximal cisternal segment of left trigeminal nerve; minimal to mild chronic microvascular ischemic changes; no abnormal enhancement; mild bilateral papilledema; 1.5 cm partially imaged right parotid lesion; no high-grade narrowing, large vessel occlusion, dissection, or aneurysm.  She has discussed this with Dr. Mickeal Skinner here and tells me she did like him but has decided she would prefer to stay at [facility] for surgery.  She has met with Dr. Theda Sers in neurology and has an appointment with Dr. Rosine Door in neurosurgery.  She also underwent bilateral diagnostic mammography with tomography at [facility] on 04/30/2020  showing: breast density category B; no evidence of malignancy in either breast.   Bone density screening performed the same day showed a T-score of -0.5, which is considered normal.   REVIEW OF SYSTEMS: [name] was found to be significantly hyponatremic.  Her medications have largely been changed and I have entered those changes.  She is now on Lyrica with better control of the pain although she is finding she needs increasing doses.  This morning she had some dysuria, no hematuria.  A detailed review of systems today was otherwise stable.   COVID 19 VACCINATION STATUS:    HISTORY OF CURRENT ILLNESS: From the original intake note:  Lisa Cardenas [pronounced [id number]] had routine screening mammography on 08/10/2019 showing a possible abnormality in the left breast. She underwent left diagnostic mammography with tomography and left breast ultrasonography in [place], [place] on 09/11/2019 showing: scattered fibroglandular densities asymmetry in upper-outer left breast with no definitive sonographic correlate.  Accordingly on 10/06/2019 she proceeded to biopsy of the left breast area in question. The pathology from this procedure ([id number] at [facility] in [place]) ) showed: invasive lobular carcinoma, E-cadherin negative, grade 1. Prognostic indicators significant for: estrogen receptor, greater than 90% positive and progesterone receptor, greater than 90% positive, both with strong staining intensity proliferation marker Ki67 at 5-7%. HER2 negative by immunohistochemistry.  The patient's subsequent history is as detailed below.   PAST MEDICAL HISTORY: Past Medical History:  Diagnosis Date   A-fib [place])    no issues since May 2020   Arrhythmia    Cancer [facility])    Diverticulosis    Elevated cholesterol    Family history of breast cancer    Family history of melanoma  Family history of pancreatic cancer    Family history of prostate cancer    Hypertension    Obesity     Pancreatitis    Thyroid disease    Trigeminal neuralgia    Trigeminal neuralgia    UTI (urinary tract infection)   Chronic anticoagulation secondary to atrial fibrillation, GERD, irritable bowel syndrome   PAST SURGICAL HISTORY: Past Surgical History:  Procedure Laterality Date   ABDOMINAL HYSTERECTOMY  1980   BLADDER REPAIR     BREAST BIOPSY Left 11/2019   BREAST LUMPECTOMY Left 11/23/2019   BREAST LUMPECTOMY WITH RADIOACTIVE SEED AND SENTINEL LYMPH NODE BIOPSY Left 11/23/2019   Procedure: LEFT BREAST LUMPECTOMY WITH RADIOACTIVE SEED AND LEFT AXILLARY SENTINEL LYMPH NODE BIOPSY;  Surgeon: Rolm Bookbinder, MD;  Location: [facility];  Service: General;  Laterality: Left;  PEC BLOCK   [facility]   [facility]     [facility]   KNEE ARTHROSCOPY Left    TONSILLECTOMY    Ovaries still in place, right fallopian tube removed, status post cystocele repair   FAMILY HISTORY: Family History  Problem Relation Age of Onset   Colon cancer Mother        dx 33   Breast cancer Maternal Grandmother    Breast cancer Maternal Aunt    Prostate cancer Maternal Uncle    Breast cancer Paternal Aunt    Melanoma Paternal Uncle    Breast cancer Maternal Aunt    Leukemia Maternal Uncle    Breast cancer Paternal Aunt    Colon cancer Paternal 46    Melanoma Daughter   The family history is best documented in the genetics note but in brief the patient's father died at the age of 65 from sepsis and the patient's mother at the age of 6 from COPD.  The patient's mother had colon cancer.  The maternal grandmother had breast cancer in her late 55s and 2 maternal aunts had breast cancer and 1 maternal cousin had breast cancer and later developed pancreatic cancer.  On the father's side 2 paternal aunts had breast cancer, one paternal aunt had colon cancer, all older than 68 years old, 1 paternal uncle had leukemia and one paternal uncle had prostate  cancer   GYNECOLOGIC HISTORY:  No LMP recorded. Patient has had a hysterectomy. Menarche: 69 years old Age at first live birth: 69 years old [place] P 3 LMP hysterectomy age 42 Contraceptive used oral contraceptives only a few months, without complication HRT estrogen only for 7 to 10 years  Hysterectomy? yes BSO?  No   SOCIAL HISTORY: (updated 10/2019)  Olin Hauser "[name]" retired from working as an Haematologist.  Her husband of more than 65 years, Clair Gulling, has a Oceanographer in Engineer, mining and work in Social research officer, government for [employer].  He is retired and now Health and safety inspector.  Daughter Cyril Mourning lives in [place] and owns and runs a center for pediatric treatment as well as a school for autistic children's.  Daughter Loree Fee in [place] has a Oceanographer in Animal nutritionist studies and teaches teaching at [employer]; daughter Janett Billow lives in [place] and works at [employer] base there.  The patient has 3 grandchildren.  She is a member of the [employer] denomination.   ADVANCED DIRECTIVES: In the absence of any documents to the contrary the patient's husband is her healthcare power of attorney   HEALTH MAINTENANCE: Social History   Tobacco Use   Smoking  status: Never Smoker   Smokeless tobacco: Never Used  Vaping Use   Vaping Use: Never used  Substance Use Topics   Alcohol use: Not Currently   Drug use: Not Currently     Colonoscopy: October 2020/ Pyrtle  PAP: 2016  Bone density: Remote   Allergies  Allergen Reactions   Plaquenil [Hydroxychloroquine]     Increased heart rate    Current Outpatient Medications  Medication Sig Dispense Refill   anastrozole (ARIMIDEX) 1 MG tablet Take 1 tablet (1 mg total) by mouth daily. 30 tablet 3   celecoxib (CELEBREX) 200 MG capsule Take 200 mg by mouth every 30 (thirty) days.     dexamethasone (DECADRON) 4 MG tablet Take 1 tablet (4 mg total) by mouth daily. 7 tablet 0   esomeprazole (NEXIUM) 40 MG capsule  Take 40 mg by mouth 2 (two) times daily as needed.     gabapentin (NEURONTIN) 400 MG capsule Take 2 capsules (800 mg total) by mouth 2 (two) times daily. One in late morning and one at night 120 capsule 2   indomethacin (INDOCIN) 25 MG capsule Take 1 capsule (25 mg total) by mouth 3 (three) times daily with meals. 60 capsule 0   levothyroxine (SYNTHROID) 137 MCG tablet Take 137 mcg by mouth daily. Current dose 137 mcg daily     LORazepam (ATIVAN) 0.5 MG tablet Take 0.5 mg by mouth as needed for anxiety.     metoprolol succinate (TOPROL-XL) 100 MG 24 hr tablet Take 100 mg by mouth 2 (two) times daily.     olmesartan-hydrochlorothiazide (BENICAR HCT) 20-12.5 MG tablet Take 1 tablet by mouth daily.     Oxcarbazepine (TRILEPTAL) 300 MG tablet Take 2 tablets (600 mg total) by mouth 2 (two) times daily. 120 tablet 2   primidone (MYSOLINE) 50 MG tablet Take 50 mg by mouth daily.      rivaroxaban (XARELTO) 20 MG TABS tablet Take 20 mg by mouth daily.     sertraline (ZOLOFT) 50 MG tablet Take 50 mg by mouth daily.     simvastatin (ZOCOR) 40 MG tablet Take 40 mg by mouth daily.     SUMAtriptan Succinate 3 MG/0.5ML SOAJ Inject 3 mg into the skin once for 1 dose. 0.5 mL 3   Vitamin D, Ergocalciferol, (DRISDOL) 1.25 MG (50000 UNIT) CAPS capsule Take 50,000 Units by mouth once a week.     No current facility-administered medications for this visit.    OBJECTIVE: White woman who appears stated age  37:   05/13/20 1123  BP: (!) 140/57  Pulse: (!) 58  Resp: 17  Temp: 98 F (36.7 C)  SpO2: 98%     Body mass index is 40.9 kg/m.   Wt Readings from Last 3 Encounters:  05/13/20 238 lb 4.8 oz (108.1 kg)  04/19/20 245 lb 8 oz (111.4 kg)  01/26/20 250 lb 4.8 oz (113.5 kg)      ECOG FS:1 - Symptomatic but completely ambulatory  Sclerae unicteric, EOMs intact Wearing a mask No cervical or supraclavicular adenopathy Lungs no rales or rhonchi Heart regular rate and rhythm Abd soft,  nontender, positive bowel sounds MSK no focal spinal tenderness, no upper extremity lymphedema Neuro: nonfocal, well oriented, appropriate affect Breasts: The right breast is unremarkable.  The left breast is status post lumpectomy with no evidence of disease recurrence.  There is minimal residual erythema.  There is a palpable mass in the upper outer quadrant insistent with a seroma.  There is some coarsening of  the skin in the inframammary fold as expected.  Both axillae are benign.   LAB RESULTS:  CMP     Component Value Date/Time   NA 138 11/20/2019 1100   K 4.0 11/20/2019 1100   CL 101 11/20/2019 1100   CO2 27 11/20/2019 1100   GLUCOSE 100 (H) 11/20/2019 1100   BUN 15 11/20/2019 1100   CREATININE 0.76 11/20/2019 1100   CREATININE 0.78 10/26/2019 1507   CALCIUM 9.8 11/20/2019 1100   PROT 7.4 10/26/2019 1507   ALBUMIN 4.1 10/26/2019 1507   AST 11 (L) 10/26/2019 1507   ALT 15 10/26/2019 1507   ALKPHOS 79 10/26/2019 1507   BILITOT 0.4 10/26/2019 1507   GFRNONAA >60 11/20/2019 1100   GFRNONAA >60 10/26/2019 1507   GFRAA >60 11/20/2019 1100   GFRAA >60 10/26/2019 1507    No results found for: TOTALPROTELP, ALBUMINELP, A1GS, A2GS, BETS, BETA2SER, GAMS, MSPIKE, SPEI  Lab Results  Component Value Date   WBC 6.0 05/13/2020   NEUTROABS 4.7 05/13/2020   HGB 13.6 05/13/2020   HCT 41.3 05/13/2020   MCV 86.4 05/13/2020   PLT 228 05/13/2020    No results found for: LABCA2  No components found for: [id number]  No results for input(s): INR in the last 168 hours.  No results found for: LABCA2  No results found for: PZW258  No results found for: NID782  No results found for: UMP536  No results found for: CA2729  No components found for: HGQUANT  No results found for: CEA1 / No results found for: CEA1   No results found for: AFPTUMOR  No results found for: CHROMOGRNA  No results found for: KPAFRELGTCHN, LAMBDASER, KAPLAMBRATIO (kappa/lambda light chains)  No  results found for: HGBA, HGBA2QUANT, HGBFQUANT, HGBSQUAN (Hemoglobinopathy evaluation)   No results found for: LDH  No results found for: IRON, TIBC, IRONPCTSAT (Iron and TIBC)  No results found for: FERRITIN  Urinalysis No results found for: COLORURINE, APPEARANCEUR, LABSPEC, PHURINE, GLUCOSEU, HGBUR, BILIRUBINUR, KETONESUR, PROTEINUR, UROBILINOGEN, NITRITE, LEUKOCYTESUR   STUDIES: MR ANGIO HEAD WO CONTRAST  Result Date: 04/24/2020 CLINICAL DATA:  Trigeminal neuralgia, headache. EXAM: MRI HEAD WITHOUT AND WITH CONTRAST MRA HEAD WITHOUT CONTRAST TECHNIQUE: Multiplanar, multiecho pulse sequences of the brain and surrounding structures were obtained without and with intravenous contrast. Angiographic images of the head were obtained using MRA technique without contrast. CONTRAST:  41m GADAVIST GADOBUTROL 1 MMOL/ML IV SOLN COMPARISON:  None. FINDINGS: MRI HEAD FINDINGS Brain: No diffusion-weighted signal abnormality. Remote right frontal microhemorrhage. No midline shift, ventriculomegaly or extra-axial fluid collection. No mass lesion. Cerebral volume is within normal limits. Scattered T2/FLAIR hyperintense foci involving the subcortical and periventricular white matter. Normal appearance of the cerebellopontine angles and internal auditory canals. The seventh and eighth cranial nerves are distinct within the IACs. Normal bilateral inner ear morphology. The bilateral trigeminal nerves demonstrate normal morphology and signal intensity. The left superior cerebellar artery abuts the proximal cisternal segment of the left trigeminal nerve (8:209). Normal appearance of the bilateral MM[place] Vascular: Please see MRA head for further evaluation of the major intracranial vessels. Skull and upper cervical spine: Normal marrow signal. Sinuses/Orbits: Bilateral optic nerve sheath distension. Otherwise normal orbits. Clear paranasal sinuses and mastoid air cells. Other: Partially imaged 1.5 x 0.7 cm  deep right parotid lesion (12:2). MRA HEAD FINDINGS Anterior circulation: Patent normal caliber appearance of the ICAs, anterior and middle cerebral arteries. No significant stenosis, proximal occlusion, aneurysm, or vascular malformation. Posterior circulation: Normal appearance of the bilateral  V4 segments. Proximally patent PICA. The basilar, superior cerebellar and posterior cerebral arteries demonstrate normal caliber and patency. Fetal origin of the left PCA. Right PCOM hypoplasia. No significant stenosis, proximal occlusion, aneurysm, or vascular malformation. Venous sinuses: As permitted by contrast timing, patent. Anatomic variants: Detailed above. IMPRESSION: MRI head: Left superior cerebellar artery abuts the proximal cisternal segment of the left trigeminal nerve. Minimal to mild chronic microvascular ischemic changes. No abnormal enhancement. Mild bilateral papilledema. 1.5 cm partially imaged right parotid lesion. Consider maxillofacial CT with contrast for complete evaluation. Differential includes pleomorphic adenoma, intraparotid node and primary neoplasm. MRA head: No high-grade narrowing, large vessel occlusion, dissection or aneurysm. Electronically Signed   By: Primitivo Gauze M.D.   On: 04/24/2020 17:18   MR Brain W Wo Contrast  Result Date: 04/24/2020 CLINICAL DATA:  Trigeminal neuralgia, headache. EXAM: MRI HEAD WITHOUT AND WITH CONTRAST MRA HEAD WITHOUT CONTRAST TECHNIQUE: Multiplanar, multiecho pulse sequences of the brain and surrounding structures were obtained without and with intravenous contrast. Angiographic images of the head were obtained using MRA technique without contrast. CONTRAST:  10m GADAVIST GADOBUTROL 1 MMOL/ML IV SOLN COMPARISON:  None. FINDINGS: MRI HEAD FINDINGS Brain: No diffusion-weighted signal abnormality. Remote right frontal microhemorrhage. No midline shift, ventriculomegaly or extra-axial fluid collection. No mass lesion. Cerebral volume is within normal  limits. Scattered T2/FLAIR hyperintense foci involving the subcortical and periventricular white matter. Normal appearance of the cerebellopontine angles and internal auditory canals. The seventh and eighth cranial nerves are distinct within the IACs. Normal bilateral inner ear morphology. The bilateral trigeminal nerves demonstrate normal morphology and signal intensity. The left superior cerebellar artery abuts the proximal cisternal segment of the left trigeminal nerve (8:209). Normal appearance of the bilateral ML[place] Vascular: Please see MRA head for further evaluation of the major intracranial vessels. Skull and upper cervical spine: Normal marrow signal. Sinuses/Orbits: Bilateral optic nerve sheath distension. Otherwise normal orbits. Clear paranasal sinuses and mastoid air cells. Other: Partially imaged 1.5 x 0.7 cm deep right parotid lesion (12:2). MRA HEAD FINDINGS Anterior circulation: Patent normal caliber appearance of the ICAs, anterior and middle cerebral arteries. No significant stenosis, proximal occlusion, aneurysm, or vascular malformation. Posterior circulation: Normal appearance of the bilateral V4 segments. Proximally patent PICA. The basilar, superior cerebellar and posterior cerebral arteries demonstrate normal caliber and patency. Fetal origin of the left PCA. Right PCOM hypoplasia. No significant stenosis, proximal occlusion, aneurysm, or vascular malformation. Venous sinuses: As permitted by contrast timing, patent. Anatomic variants: Detailed above. IMPRESSION: MRI head: Left superior cerebellar artery abuts the proximal cisternal segment of the left trigeminal nerve. Minimal to mild chronic microvascular ischemic changes. No abnormal enhancement. Mild bilateral papilledema. 1.5 cm partially imaged right parotid lesion. Consider maxillofacial CT with contrast for complete evaluation. Differential includes pleomorphic adenoma, intraparotid node and primary neoplasm. MRA head: No  high-grade narrowing, large vessel occlusion, dissection or aneurysm. Electronically Signed   By: CPrimitivo GauzeM.D.   On: 04/24/2020 17:18   DG Bone Density  Result Date: 04/30/2020 EXAM: DUAL X-RAY ABSORPTIOMETRY (DXA) FOR BONE MINERAL DENSITY IMPRESSION: Referring Physician:  GChauncey CruelYour patient completed a BMD test using Lunar IDXA DXA system ( analysis version: 16 ) manufactured by [employer] Technologist: AW PATIENT: Name: HKeyauna, GraefePatient ID: [id number] Date: [birth year]Height: 64.0 in. Sex: Female Measured: 04/30/2020 Weight: 240.8 lbs. Indications: Anastrazole, Breast Cancer History, Caucasian, Estrogen Deficient, Hypothyroid, Hysterectomy, Levothyroxine, Nexium, Postmenopausal, Vitamin D Deficient, Zoloft Fractures: None Treatments: Vitamin D ((O671.2 ASSESSMENT:  The BMD measured at Femur Neck Left is 0.964 g/cm2 with a T-score of -0.5. This patient is considered NORMAL according to [facility]) criteria. The scan quality is good. Site Region Measured Date Measured Age YA BMD Significant CHANGE DualFemur Neck Left  6/30/[age]         -0.5    0.964 g/cm2 AP Spine  L1-L4      6/30/[age]         1.9     1.410 g/cm2 DualFemur Total Mean 6/30/[age]    [age]         0.8     1.089 g/cm2 World Health Organization [facility]) criteria for post-menopausal, Caucasian Women: Normal       T-score at or above -1 SD Osteopenia   T-score between -1 and -2.5 SD Osteoporosis T-score at or below -2.5 SD RECOMMENDATION: 1. All patients should optimize calcium and vitamin D intake. 2. Consider FDA approved medical therapies in postmenopausal women and men aged 2 years and older, based on the following: a. A hip or vertebral (clinical or morphometric) fracture b. T-score < or = -2.5 at the femoral neck or spine after appropriate evaluation to exclude secondary causes c. Low bone mass (T-score between -1.0 and -2.5 at the femoral neck or spine) and a 10 year  probability of a hip fracture > or = 3% or a 10 year probability of a major osteoporosis-related fracture > or = 20% based on the [place]-adapted WHO algorithm d. Clinician judgment and/or patient preferences may indicate treatment for people with 10-year fracture probabilities above or below these levels FOLLOW-UP: People with diagnosed cases of osteoporosis or at high risk for fracture should have regular bone mineral density tests. For patients eligible for Medicare, routine testing is allowed once every 2 years. The testing frequency can be increased to one year for patients who have rapidly progressing disease, those who are receiving or discontinuing medical therapy to restore bone mass, or have additional risk factors. I have reviewed this report and agree with the above findings. Mark A. Thornton Papas, M.D. [facility] Electronically Signed   By: Lavonia Dana M.D.   On: 04/30/2020 14:09   MM DIAG BREAST TOMO BILATERAL  Result Date: 04/30/2020 CLINICAL DATA:  Patient had LEFT lumpectomy in May 2021. EXAM: DIGITAL DIAGNOSTIC BILATERAL MAMMOGRAM WITH TOMO AND CAD COMPARISON:  Previous exam(s). ACR Breast Density Category b: There are scattered areas of fibroglandular density. FINDINGS: Post operative changes are seen in the LEFTbreast. No suspicious mass, distortion, or microcalcifications are identified to suggest presence of malignancy. Mammographic images were processed with CAD. IMPRESSION: No mammographic evidence for malignancy. RECOMMENDATION: Diagnostic mammogram is suggested in 1 year. (Code:DM-B-01Y) I have discussed the findings and recommendations with the patient. If applicable, a reminder letter will be sent to the patient regarding the next appointment. BI-RADS CATEGORY  2: Benign. Electronically Signed   By: Nolon Nations M.D.   On: 04/30/2020 12:38     ELIGIBLE FOR AVAILABLE RESEARCH PROTOCOL:AET  ASSESSMENT: 69 y.o.  [place], [place] woman status post left breast overlapping sites biopsy  10/06/2019 for a clinical TXN0, stage IA invasive lobular carcinoma, grade 1, E-cadherin negative, estrogen and progesterone receptor positive, HER-2 not amplified, with an MIB-1 of 5-7%.  (a) breast MRI 11/07/2019 showed a 0.7 cm upper outer quadrant mass in the left breast with no other evidence of disease  (1) genetics testing 04/23/2021on the [facility] Breast Cancer STAT Panel +  Common Hereditary Cancers Panel found no deleterious mutations in ATM, BRCA1, BRCA2, CDH1, CHEK2, PALB2, PTEN, STK11 and TP53, APC, ATM, AXIN2, BARD1, BMPR1A, BRCA1, BRCA2, BRIP1, CDH1, CDKN2A (p14ARF), CDKN2A (p16INK4a), CKD4, CHEK2, CTNNA1, DICER1, EPCAM (Deletion/duplication testing only), GREM1 (promoter region deletion/duplication testing only), KIT, MEN1, MLH1, MSH2, MSH3, MSH6, MUTYH, NBN, NF1, NHTL1, PALB2, PDGFRA, PMS2, POLD1, POLE, PTEN, RAD50, RAD51C, RAD51D, RNF43, SDHB, SDHC, SDHD, SMAD4, SMARCA4. STK11, TP53, TSC1, TSC2, and VHL.  The following genes were evaluated for sequence changes only: SDHA and HOXB13 c.251G>A variant only.  (2) status post left lumpectomy and sentinel lymph node sampling 11/23/2019 for a pT1b pN0, stage IA invasive lobular carcinoma, with negative margins  (a) 1 sentinel lymph node removed  (3) Oncotype score of 18 predicts a risk of recurrence outside the breast in the next 9 years of 5% if the patient's only systemic therapy is an antiestrogen for 5 years.  It predicts no benefit from chemotherapy.  (4) adjuvant radiation 01/02/2020 through 01/24/2020 Site Technique Total Dose (Gy) Dose per Fx (Gy) Completed Fx Beam Energies  Breast, Left: Breast_Lt 3D 42.56/42.56 2.66 16/16 10X, 15X   (5) started anastrozole 02/26/2020  (a) bone density scan October 2021 shows a T score of -0.5 (normal)   PLAN: Heavenly did very well with her radiation and is tolerating anastrozole generally well.  We reviewed her bone density which is also very favorable.    She does have somewhat dry skin and any  emollient should help.  This morning she noted some dysuria as noted above.  We are checking a urinalysis and urine culture today and will treat as needed  I am hopeful of the upcoming surgery at [facility] will relieve the trigeminal neuralgia.  She is very pleased with the neurologist Dr.Collins over there.  She has not yet met the surgeon  I am not uncomfortable seeing [name] on a once a year basis since she has a very good prognosis for so long as she is tolerating anastrozole well the plan will be to continue that for a total of 5 years.  Total encounter time 25 minutes.Sarajane Jews C. Baylei Siebels, MD 05/13/2020 11:31 AM Medical Oncology and Hematology [facility] [place], [place] [id number] Tel. [phone]    Fax. [phone]   This document serves as a record of services personally performed by Lurline Del, MD. It was created on his behalf by Wilburn Mylar, a trained medical scribe. The creation of this record is based on the scribe's personal observations and the provider's statements to them.   I, Lurline Del MD, have reviewed the above documentation for accuracy and completeness, and I agree with the above.   *Total Encounter Time as defined by the Centers for Medicare and Medicaid Services includes, in addition to the face-to-face time of a patient visit (documented in the note above) non-face-to-face time: obtaining and reviewing outside history, ordering and reviewing medications, tests or procedures, care coordination (communications with other health care professionals or caregivers) and documentation in the medical record.

## 2020-05-13 ENCOUNTER — Other Ambulatory Visit: Payer: Self-pay | Admitting: Oncology

## 2020-05-13 ENCOUNTER — Other Ambulatory Visit: Payer: Self-pay

## 2020-05-13 ENCOUNTER — Inpatient Hospital Stay: Payer: Medicare Other | Attending: Oncology | Admitting: Oncology

## 2020-05-13 ENCOUNTER — Other Ambulatory Visit: Payer: Medicare Other

## 2020-05-13 ENCOUNTER — Inpatient Hospital Stay: Payer: Medicare Other

## 2020-05-13 VITALS — BP 140/57 | HR 58 | Temp 98.0°F | Resp 17 | Ht 64.0 in | Wt 238.3 lb

## 2020-05-13 DIAGNOSIS — Z803 Family history of malignant neoplasm of breast: Secondary | ICD-10-CM | POA: Insufficient documentation

## 2020-05-13 DIAGNOSIS — Z800 Family history of malignant neoplasm of digestive organs: Secondary | ICD-10-CM | POA: Diagnosis not present

## 2020-05-13 DIAGNOSIS — Z170 Estrogen receptor positive status [ER+]: Secondary | ICD-10-CM

## 2020-05-13 DIAGNOSIS — C50412 Malignant neoplasm of upper-outer quadrant of left female breast: Secondary | ICD-10-CM | POA: Diagnosis not present

## 2020-05-13 DIAGNOSIS — Z90710 Acquired absence of both cervix and uterus: Secondary | ICD-10-CM | POA: Insufficient documentation

## 2020-05-13 DIAGNOSIS — R300 Dysuria: Secondary | ICD-10-CM | POA: Diagnosis not present

## 2020-05-13 DIAGNOSIS — L299 Pruritus, unspecified: Secondary | ICD-10-CM | POA: Insufficient documentation

## 2020-05-13 DIAGNOSIS — Z806 Family history of leukemia: Secondary | ICD-10-CM | POA: Insufficient documentation

## 2020-05-13 DIAGNOSIS — Z808 Family history of malignant neoplasm of other organs or systems: Secondary | ICD-10-CM | POA: Insufficient documentation

## 2020-05-13 DIAGNOSIS — Z79899 Other long term (current) drug therapy: Secondary | ICD-10-CM | POA: Insufficient documentation

## 2020-05-13 DIAGNOSIS — E871 Hypo-osmolality and hyponatremia: Secondary | ICD-10-CM | POA: Insufficient documentation

## 2020-05-13 DIAGNOSIS — Z6841 Body Mass Index (BMI) 40.0 and over, adult: Secondary | ICD-10-CM

## 2020-05-13 DIAGNOSIS — Z79811 Long term (current) use of aromatase inhibitors: Secondary | ICD-10-CM | POA: Insufficient documentation

## 2020-05-13 DIAGNOSIS — C50812 Malignant neoplasm of overlapping sites of left female breast: Secondary | ICD-10-CM | POA: Insufficient documentation

## 2020-05-13 DIAGNOSIS — G500 Trigeminal neuralgia: Secondary | ICD-10-CM | POA: Insufficient documentation

## 2020-05-13 LAB — CBC WITH DIFFERENTIAL/PLATELET
Abs Immature Granulocytes: 0.02 10*3/uL (ref 0.00–0.07)
Basophils Absolute: 0 10*3/uL (ref 0.0–0.1)
Basophils Relative: 1 %
Eosinophils Absolute: 0.1 10*3/uL (ref 0.0–0.5)
Eosinophils Relative: 2 %
HCT: 41.3 % (ref 36.0–46.0)
Hemoglobin: 13.6 g/dL (ref 12.0–15.0)
Immature Granulocytes: 0 %
Lymphocytes Relative: 14 %
Lymphs Abs: 0.8 10*3/uL (ref 0.7–4.0)
MCH: 28.5 pg (ref 26.0–34.0)
MCHC: 32.9 g/dL (ref 30.0–36.0)
MCV: 86.4 fL (ref 80.0–100.0)
Monocytes Absolute: 0.4 10*3/uL (ref 0.1–1.0)
Monocytes Relative: 6 %
Neutro Abs: 4.7 10*3/uL (ref 1.7–7.7)
Neutrophils Relative %: 77 %
Platelets: 228 10*3/uL (ref 150–400)
RBC: 4.78 MIL/uL (ref 3.87–5.11)
RDW: 14.4 % (ref 11.5–15.5)
WBC: 6 10*3/uL (ref 4.0–10.5)
nRBC: 0 % (ref 0.0–0.2)

## 2020-05-13 LAB — URINALYSIS, COMPLETE (UACMP) WITH MICROSCOPIC
Bilirubin Urine: NEGATIVE
Glucose, UA: NEGATIVE mg/dL
Ketones, ur: NEGATIVE mg/dL
Nitrite: NEGATIVE
Protein, ur: NEGATIVE mg/dL
RBC / HPF: >50 RBC/hpf — ABNORMAL HIGH (ref 0–5)
Specific Gravity, Urine: 1.012 (ref 1.005–1.030)
WBC, UA: >50 WBC/hpf — ABNORMAL HIGH (ref 0–5)
pH: 7 (ref 5.0–8.0)

## 2020-05-13 LAB — COMPREHENSIVE METABOLIC PANEL
ALT: 18 U/L (ref 0–44)
AST: 10 U/L — ABNORMAL LOW (ref 15–41)
Albumin: 4 g/dL (ref 3.5–5.0)
Alkaline Phosphatase: 68 U/L (ref 38–126)
Anion gap: 6 (ref 5–15)
BUN: 13 mg/dL (ref 8–23)
CO2: 30 mmol/L (ref 22–32)
Calcium: 10.2 mg/dL (ref 8.9–10.3)
Chloride: 104 mmol/L (ref 98–111)
Creatinine, Ser: 0.74 mg/dL (ref 0.44–1.00)
GFR, Estimated: >60 mL/min (ref >60)
Glucose, Bld: 98 mg/dL (ref 70–99)
Potassium: 3.9 mmol/L (ref 3.5–5.1)
Sodium: 140 mmol/L (ref 135–145)
Total Bilirubin: 0.7 mg/dL (ref 0.3–1.2)
Total Protein: 7 g/dL (ref 6.5–8.1)

## 2020-05-13 MED ORDER — NITROFURANTOIN MONOHYD MACRO 100 MG PO CAPS
100.0000 mg | ORAL_CAPSULE | Freq: Two times a day (BID) | ORAL | 0 refills | Status: AC
Start: 2020-05-13 — End: 2020-05-20

## 2020-05-13 MED ORDER — CIPROFLOXACIN HCL 500 MG PO TABS
500.0000 mg | ORAL_TABLET | Freq: Two times a day (BID) | ORAL | 1 refills | Status: DC
Start: 2020-05-13 — End: 2020-05-13

## 2020-05-15 LAB — URINE CULTURE: Culture: 50000 — AB

## 2020-05-20 ENCOUNTER — Encounter: Payer: Self-pay | Admitting: Oncology

## 2020-05-21 ENCOUNTER — Inpatient Hospital Stay (HOSPITAL_BASED_OUTPATIENT_CLINIC_OR_DEPARTMENT_OTHER): Payer: Medicare Other | Admitting: Medical

## 2020-05-21 ENCOUNTER — Other Ambulatory Visit: Payer: Self-pay

## 2020-05-21 ENCOUNTER — Telehealth: Payer: Self-pay

## 2020-05-21 VITALS — BP 128/63 | HR 61 | Temp 99.2°F | Resp 18 | Ht 64.0 in | Wt 238.4 lb

## 2020-05-21 DIAGNOSIS — Z170 Estrogen receptor positive status [ER+]: Secondary | ICD-10-CM

## 2020-05-21 DIAGNOSIS — C50412 Malignant neoplasm of upper-outer quadrant of left female breast: Secondary | ICD-10-CM | POA: Diagnosis not present

## 2020-05-21 DIAGNOSIS — L299 Pruritus, unspecified: Secondary | ICD-10-CM

## 2020-05-21 DIAGNOSIS — C50812 Malignant neoplasm of overlapping sites of left female breast: Secondary | ICD-10-CM | POA: Diagnosis not present

## 2020-05-21 MED ORDER — TRIAMCINOLONE ACETONIDE 0.1 % EX LOTN: 1.0000 "application " | TOPICAL_LOTION | Freq: Three times a day (TID) | CUTANEOUS | 0 refills | Status: DC

## 2020-05-21 NOTE — Telephone Encounter (Signed)
This LPN called and spoke with pt. Pt understands she needs to come in and be evaluated for the (L) breast redness/itching she is experiencing. Pt will see Sandi Mealy, PA; he is aware and accepts pt. Pt understands to arrive at 1145 for check in and will be seen at 12.

## 2020-05-22 ENCOUNTER — Other Ambulatory Visit: Payer: Self-pay | Admitting: Oncology

## 2020-05-29 NOTE — Progress Notes (Signed)
[facility] Progress Note   Lisa Cardenas [id number] 04/08/1951 69 y.o.  Lisa Cardenas is managed by Dr. Lurline Del  Actively treated with chemotherapy/immunotherapy/hormonal therapy: yes  Current therapy: Arimidex  Next scheduled appointment with provider: 05/19/2021  Assessment: Plan:    Itching - Plan: triamcinolone lotion (KENALOG) 0.1 %  Malignant neoplasm of upper-outer quadrant of left breast in female, estrogen receptor positive ([place])   Itching under the left breast: The patient was given a prescription for triamcinolone lotion.  ER positive left breast cancer: Lisa Cardenas continues to be followed by Dr. Jana Hakim and continues on Arimidex. She will be seen in follow up on 05/19/2021.   Please see After Visit Summary for patient specific instructions.  Future Appointments  Date Time Provider [place]  05/19/2021 11:00 AM [facility] None  05/19/2021 11:30 AM Magrinat, Virgie Dad, MD [facility] None    No orders of the defined types were placed in this encounter.      Subjective:   Patient ID:  Lisa Cardenas is a 69 y.o. (DOB 1951-02-20) female.  Chief Complaint: No chief complaint on file.   HPI [name]  is a 69 y.o. female with a diagnosis of an ER positive left breast cancer. She is followed by Dr. Jana Hakim and continues on Arimidex. She was seen by Dr. Jana Hakim last week.  She reported to him that she was having itching under her left breast.  Which had been ongoing.  She has been using hydrocortisone cream without good relief.  She reports that her itching is episodic and is not associated with any activity.  According to her report, Dr. Jana Hakim believes that her itching was due to post radiation.  She denies any changes in personal hygiene products.  She denies any other issues of concern today.   Medications: I have reviewed the patient's current medications.  Allergies:    Allergies  Allergen Reactions  . Plaquenil [Hydroxychloroquine]     Increased heart rate    Past Medical History:  Diagnosis Date  . A-fib [facility])    no issues since May 2020  . Arrhythmia   . Cancer ([place])   . Diverticulosis   . Elevated cholesterol   . Family history of breast cancer   . Family history of melanoma   . Family history of pancreatic cancer   . Family history of prostate cancer   . Hypertension   . Obesity   . Pancreatitis   . Thyroid disease   . Trigeminal neuralgia   . Trigeminal neuralgia   . UTI (urinary tract infection)     Past Surgical History:  Procedure Laterality Date  . ABDOMINAL HYSTERECTOMY  1980  . BLADDER REPAIR    . BREAST BIOPSY Left 11/2019  . BREAST LUMPECTOMY Left 11/23/2019  . BREAST LUMPECTOMY WITH RADIOACTIVE SEED AND SENTINEL LYMPH NODE BIOPSY Left 11/23/2019   Procedure: LEFT BREAST LUMPECTOMY WITH RADIOACTIVE SEED AND LEFT AXILLARY SENTINEL LYMPH NODE BIOPSY;  Surgeon: Rolm Bookbinder, MD;  Location: [place];  Service: General;  Laterality: Left;  PEC BLOCK  . [facility]     [facility]OP[facility]OS[facility]     [facility]  . KNEE ARTHROSCOPY Left   . TONSILLECTOMY      Family History  Problem Relation Age of Onset  . Colon cancer Mother        dx 31  . Breast cancer Maternal Grandmother   . Breast cancer Maternal Aunt   .  Prostate cancer Maternal Uncle   . Breast cancer Paternal Aunt   . Melanoma Paternal Uncle   . Breast cancer Maternal Aunt   . Leukemia Maternal Uncle   . Breast cancer Paternal Aunt   . Colon cancer Paternal Aunt   . Melanoma Daughter     Social History   Socioeconomic History  . Marital status: Married    Spouse name: Not on file  . Number of children: Not on file  . Years of education: Not on file  . Highest education level: Not on file  Occupational History  . Not on file  Tobacco Use  . Smoking status: Never Smoker  . Smokeless tobacco: Never Used  Vaping Use  . Vaping  Use: Never used  Substance and Sexual Activity  . Alcohol use: Not Currently  . Drug use: Not Currently  . Sexual activity: Not on file  Other Topics Concern  . Not on file  Social History Narrative  . Not on file   Social Determinants of Health   Financial Resource Strain:   . Difficulty of Paying Living Expenses: Not on file  Food Insecurity:   . Worried About Charity fundraiser in the Last Year: Not on file  . Ran Out of Food in the Last Year: Not on file  Transportation Needs:   . Lack of Transportation (Medical): Not on file  . Lack of Transportation (Non-Medical): Not on file  Physical Activity:   . Days of Exercise per Week: Not on file  . Minutes of Exercise per Session: Not on file  Stress:   . Feeling of Stress : Not on file  Social Connections:   . Frequency of Communication with Friends and Family: Not on file  . Frequency of Social Gatherings with Friends and Family: Not on file  . Attends Religious Services: Not on file  . Active Member of Clubs or Organizations: Not on file  . Attends Archivist Meetings: Not on file  . Marital Status: Not on file  Intimate Partner Violence:   . Fear of Current or Ex-Partner: Not on file  . Emotionally Abused: Not on file  . Physically Abused: Not on file  . Sexually Abused: Not on file    Past Medical History, Surgical history, Social history, and Family history were reviewed and updated as appropriate.   Please see review of systems for further details on the patient's review from today.   Review of Systems:  Review of Systems  Constitutional: Negative for chills, diaphoresis and fever.  HENT: Negative for facial swelling and trouble swallowing.   Respiratory: Negative for cough, chest tightness and shortness of breath.   Cardiovascular: Negative for chest pain.  Skin: Negative for color change and rash.       Itching of the inferior left breast.    Objective:   Physical Exam:  BP 128/63 (BP  Location: Right Arm, Patient Position: Sitting)   Pulse 61   Temp 99.2 F (37.3 C) (Tympanic)   Resp 18   Ht 5\' 4"  (1.626 m)   Wt 238 lb 6.4 oz (108.1 kg)   SpO2 98%   BMI 40.92 kg/m  ECOG: 0  Physical Exam Constitutional:      General: She is not in acute distress.    Appearance: Normal appearance. She is not ill-appearing or diaphoretic.  HENT:     Head: Normocephalic and atraumatic.  Skin:    General: Skin is warm and dry.  Coloration: Skin is not pale.     Findings: No erythema, lesion or rash.     Comments: The skin of the lower left breast and left chest wall was without any rash, lesions, skin changes, or erythema.  Neurological:     Mental Status: She is alert.     Coordination: Coordination normal.     Gait: Gait normal.  Psychiatric:        Mood and Affect: Mood normal.        Behavior: Behavior normal.        Thought Content: Thought content normal.        Judgment: Judgment normal.     Lab Review:     Component Value Date/Time   NA 140 05/13/2020 1108   K 3.9 05/13/2020 1108   CL 104 05/13/2020 1108   CO2 30 05/13/2020 1108   GLUCOSE 98 05/13/2020 1108   BUN 13 05/13/2020 1108   CREATININE 0.74 05/13/2020 1108   CREATININE 0.78 10/26/2019 1507   CALCIUM 10.2 05/13/2020 1108   PROT 7.0 05/13/2020 1108   ALBUMIN 4.0 05/13/2020 1108   AST 10 (L) 05/13/2020 1108   AST 11 (L) 10/26/2019 1507   ALT 18 05/13/2020 1108   ALT 15 10/26/2019 1507   ALKPHOS 68 05/13/2020 1108   BILITOT 0.7 05/13/2020 1108   BILITOT 0.4 10/26/2019 1507   GFRNONAA >60 05/13/2020 1108   GFRNONAA >60 10/26/2019 1507   GFRAA >60 11/20/2019 1100   GFRAA >60 10/26/2019 1507       Component Value Date/Time   WBC 6.0 05/13/2020 1108   RBC 4.78 05/13/2020 1108   HGB 13.6 05/13/2020 1108   HGB 14.2 10/26/2019 1507   HCT 41.3 05/13/2020 1108   PLT 228 05/13/2020 1108   PLT 219 10/26/2019 1507   MCV 86.4 05/13/2020 1108   MCH 28.5 05/13/2020 1108   MCHC 32.9 05/13/2020  1108   RDW 14.4 05/13/2020 1108   LYMPHSABS 0.8 05/13/2020 1108   MONOABS 0.4 05/13/2020 1108   EOSABS 0.1 05/13/2020 1108   BASOSABS 0.0 05/13/2020 1108   -------------------------------  Imaging from last 24 hours (if applicable):  Radiology interpretation: DG Bone Density  Result Date: 04/30/2020 EXAM: DUAL X-RAY ABSORPTIOMETRY (DXA) FOR BONE MINERAL DENSITY IMPRESSION: Referring Physician:  Chauncey Cruel Your patient completed a BMD test using Lunar IDXA DXA system ( analysis version: 16 ) manufactured by [employer]. Technologist: AW PATIENT: Name: Kimley, Apsey Patient ID: [id number] Birth Date: [birth year] Height: 64.0 in. Sex: Female Measured: 04/30/2020 Weight: 240.8 lbs. Indications: Anastrazole, Breast Cancer History, Caucasian, Estrogen Deficient, Hypothyroid, Hysterectomy, Levothyroxine, Nexium, Postmenopausal, Vitamin D Deficient, Zoloft Fractures: None Treatments: Vitamin D (E933.5) ASSESSMENT: The BMD measured at Femur Neck Left is 0.964 g/cm2 with a T-score of -0.5. This patient is considered NORMAL according to [facility]) criteria. The scan quality is good. Site Region Measured Date Measured Age YA BMD Significant CHANGE DualFemur Neck Left  6/30/[age]         -0.7    0.964 g/cm2 AP Spine  L1-L4      6/30/2018    [age]         1.9     1.410 g/cm2 DualFemur Total Mean 6/30/[age]    [age]         0.8     1.089 g/cm2 World Health Organization [facility]) criteria for post-menopausal, Caucasian Women: Normal       T-score at or above -1  SD Osteopenia   T-score between -1 and -2.5 SD Osteoporosis T-score at or below -2.5 SD RECOMMENDATION: 1. All patients should optimize calcium and vitamin D intake. 2. Consider FDA approved medical therapies in postmenopausal women and men aged 90 years and older, based on the following: a. A hip or vertebral (clinical or morphometric) fracture b. T-score < or = -2.5 at the femoral neck or spine after appropriate evaluation  to exclude secondary causes c. Low bone mass (T-score between -1.0 and -2.5 at the femoral neck or spine) and a 10 year probability of a hip fracture > or = 3% or a 10 year probability of a major osteoporosis-related fracture > or = 20% based on the [place]-adapted WHO algorithm d. Clinician judgment and/or patient preferences may indicate treatment for people with 10-year fracture probabilities above or below these levels FOLLOW-UP: People with diagnosed cases of osteoporosis or at high risk for fracture should have regular bone mineral density tests. For patients eligible for Medicare, routine testing is allowed once every 2 years. The testing frequency can be increased to one year for patients who have rapidly progressing disease, those who are receiving or discontinuing medical therapy to restore bone mass, or have additional risk factors. I have reviewed this report and agree with the above findings. Mark A. Thornton Papas, M.D. [facility] Radiology Electronically Signed   By: Lavonia Dana M.D.   On: 04/30/2020 14:09   MM DIAG BREAST TOMO BILATERAL  Result Date: 04/30/2020 CLINICAL DATA:  Patient had LEFT lumpectomy in May 2021. EXAM: DIGITAL DIAGNOSTIC BILATERAL MAMMOGRAM WITH TOMO AND CAD COMPARISON:  Previous exam(s). ACR Breast Density Category b: There are scattered areas of fibroglandular density. FINDINGS: Post operative changes are seen in the LEFTbreast. No suspicious mass, distortion, or microcalcifications are identified to suggest presence of malignancy. Mammographic images were processed with CAD. IMPRESSION: No mammographic evidence for malignancy. RECOMMENDATION: Diagnostic mammogram is suggested in 1 year. (Code:[id number]-B-01Y) I have discussed the findings and recommendations with the patient. If applicable, a reminder letter will be sent to the patient regarding the next appointment. BI-RADS CATEGORY  2: Benign. Electronically Signed   By: Nolon Nations M.D.   On: 04/30/2020 12:38

## 2020-07-04 ENCOUNTER — Other Ambulatory Visit: Payer: Self-pay | Admitting: Oncology

## 2020-08-03 ENCOUNTER — Other Ambulatory Visit: Payer: Self-pay | Admitting: General Surgery

## 2020-08-03 DIAGNOSIS — N6320 Unspecified lump in the left breast, unspecified quadrant: Secondary | ICD-10-CM

## 2020-08-08 ENCOUNTER — Ambulatory Visit
Admission: RE | Admit: 2020-08-08 | Discharge: 2020-08-08 | Disposition: A | Discharge status: Home / Self Care | Payer: Medicare Other | Source: Ambulatory Visit | Attending: General Surgery | Admitting: General Surgery

## 2020-08-08 ENCOUNTER — Other Ambulatory Visit: Payer: Self-pay

## 2020-08-08 DIAGNOSIS — N6320 Unspecified lump in the left breast, unspecified quadrant: Secondary | ICD-10-CM

## 2020-11-04 ENCOUNTER — Other Ambulatory Visit: Payer: Self-pay | Admitting: Oncology

## 2021-01-18 ENCOUNTER — Encounter (HOSPITAL_COMMUNITY): Payer: Self-pay

## 2021-04-03 ENCOUNTER — Other Ambulatory Visit: Payer: Self-pay | Admitting: Oncology

## 2021-04-03 DIAGNOSIS — Z9889 Other specified postprocedural states: Secondary | ICD-10-CM

## 2021-05-02 ENCOUNTER — Other Ambulatory Visit: Payer: Self-pay

## 2021-05-02 ENCOUNTER — Ambulatory Visit
Admission: RE | Admit: 2021-05-02 | Discharge: 2021-05-02 | Disposition: A | Discharge status: Home / Self Care | Payer: Medicare Other | Source: Ambulatory Visit | Attending: Oncology | Admitting: Oncology

## 2021-05-02 DIAGNOSIS — Z9889 Other specified postprocedural states: Secondary | ICD-10-CM

## 2021-05-16 IMAGING — DX MM BREAST SURGICAL SPECIMEN
1 series · 2 of 2 positions shown · non-contrast
Comparison: Previous exam(s).

CLINICAL DATA: Radioactive seed localization was performed of the
the biopsy clip is known to be approximately 1 cm and 2 cm inferior
to the site of left breast cancer.

EXAM:
SPECIMEN RADIOGRAPH OF THE LEFT BREAST

[Series 1: specimen digital x-ray · left · 0.10mm/px · 2 of 2 slices shown]
[im 1/2]
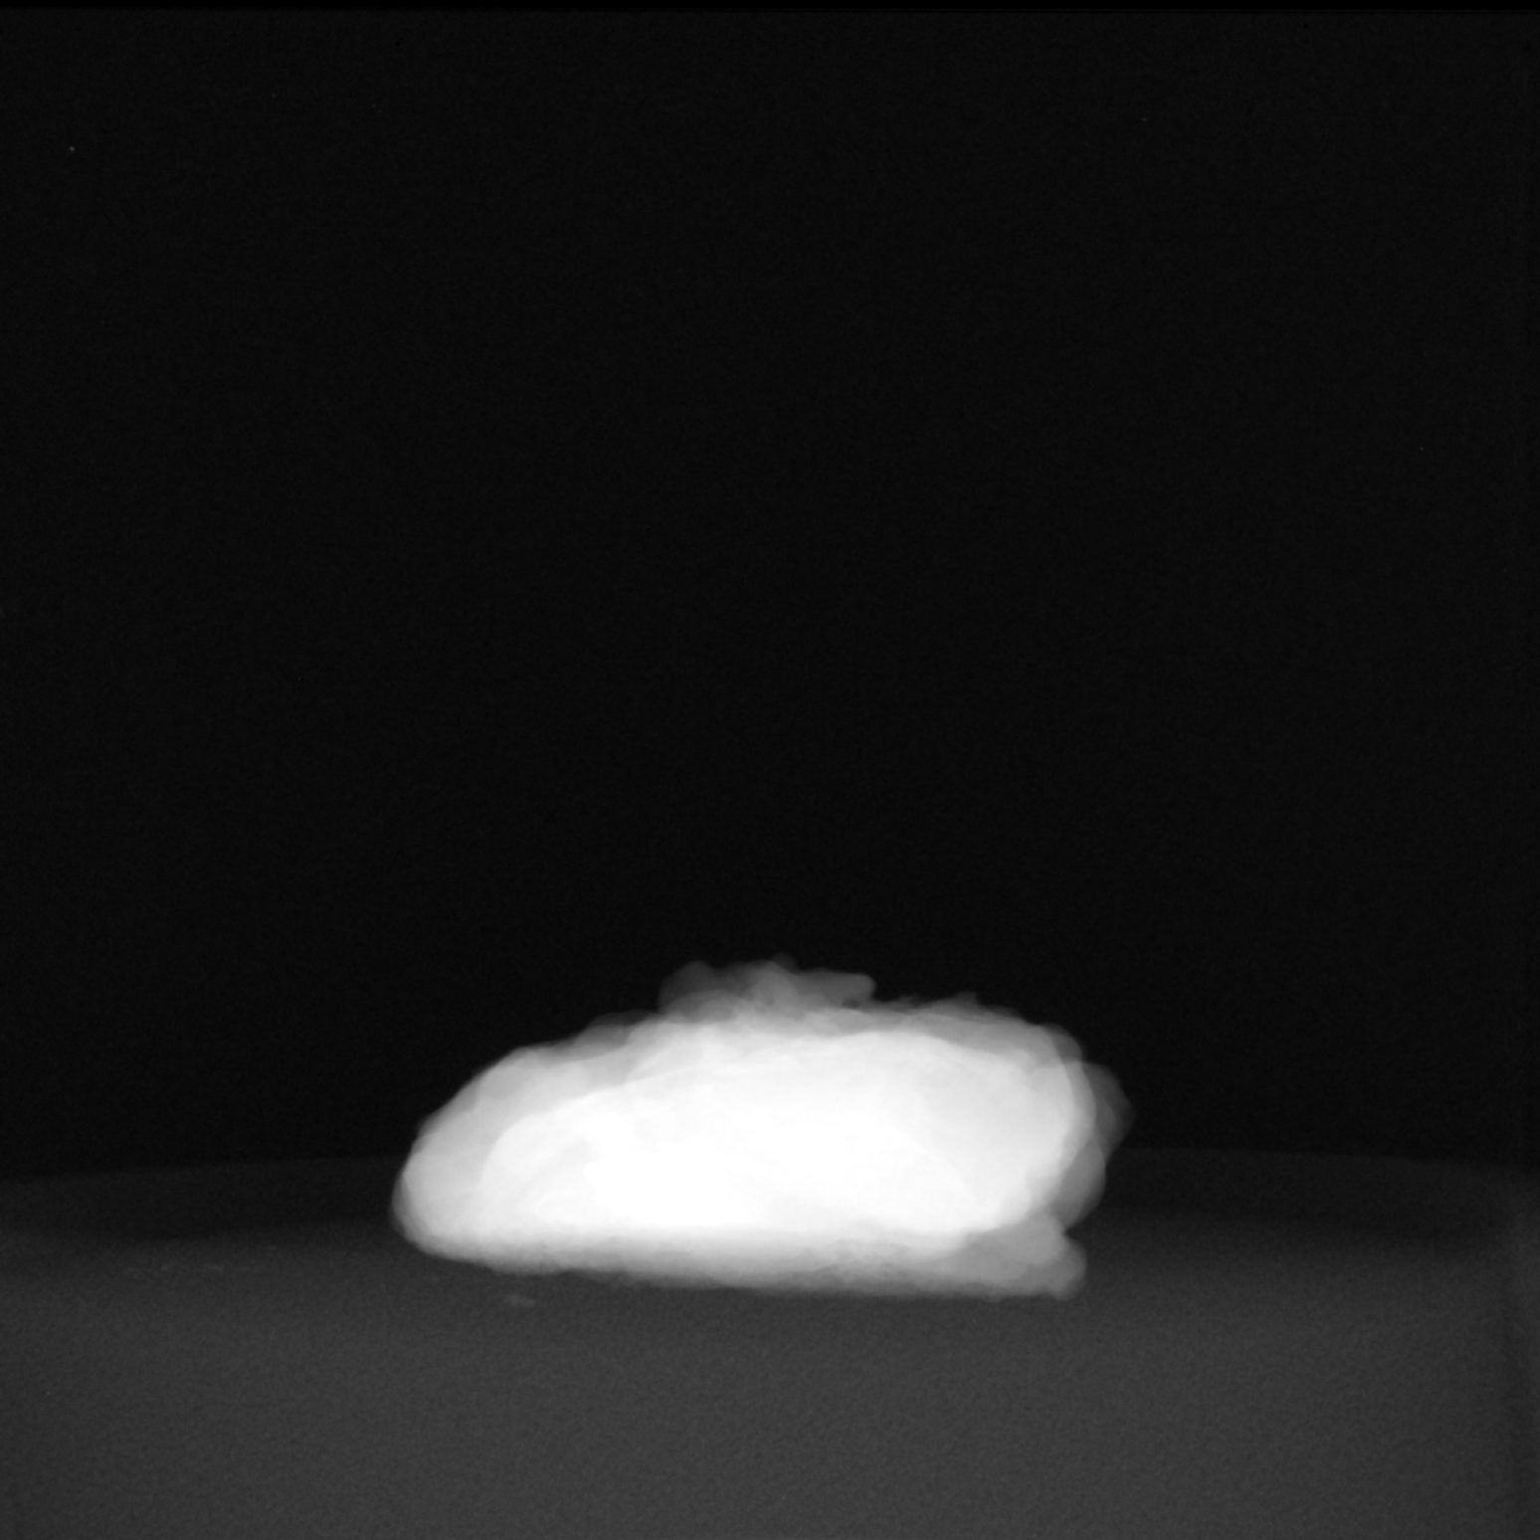
[im 2/2]
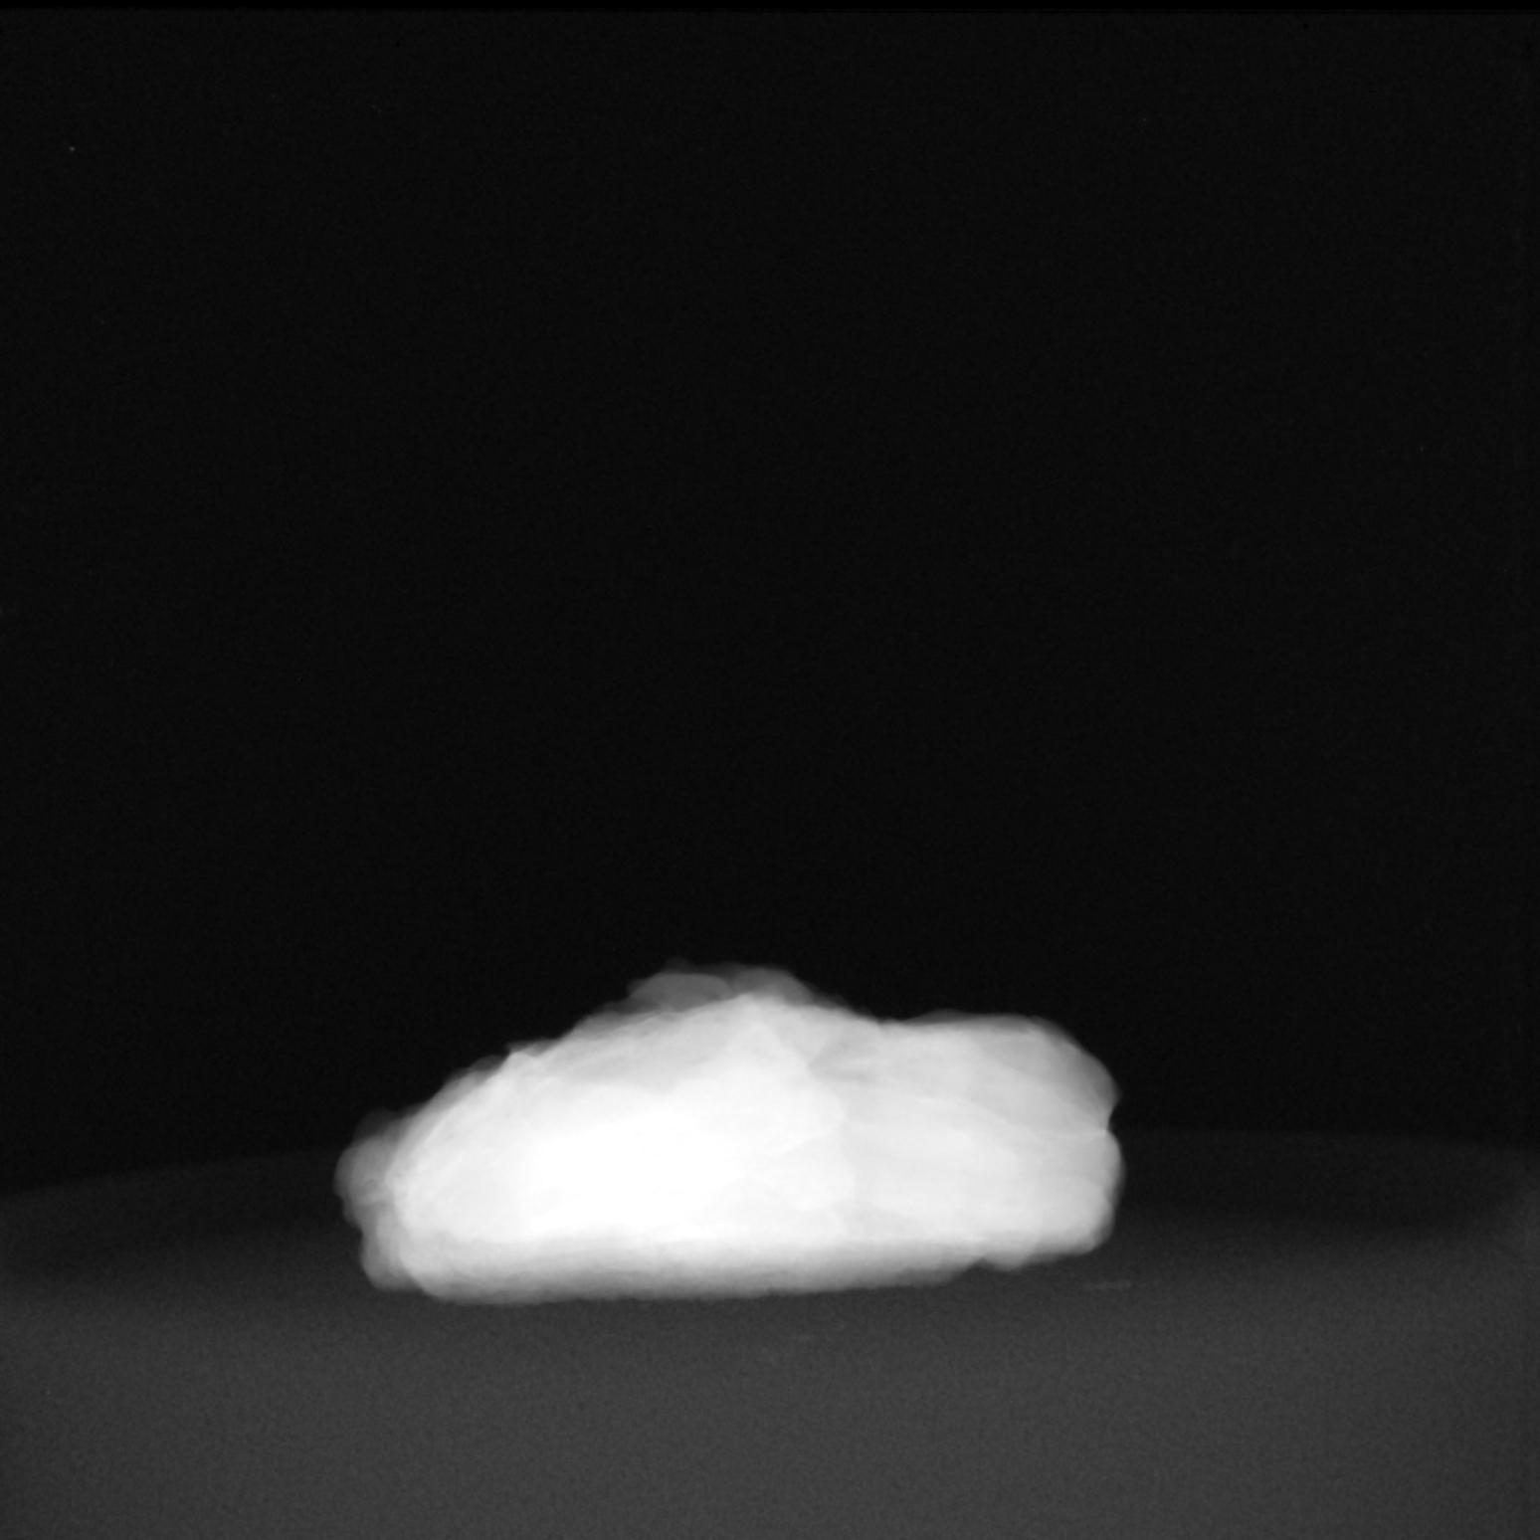

[2 of 2 positions shown; findings below may reference images not displayed]

FINDINGS: Status post excision of the left breast. The radioactive seed is
present completely intact, and were marked for pathology. The biopsy
clip, which was separate from the biopsy-proven cancer is not
included in the specimen.
IMPRESSION: Specimen radiograph of the left breast.

## 2021-05-16 IMAGING — MG MM PLC BREAST LOC DEV 1ST LESION INC MAMMO GUIDE*L*
8 of 9 series · 8 of 9 positions shown · non-contrast
Comparison: Previous exam(s).

CLINICAL DATA: Newly diagnosed LEFT breast cancer scheduled for
breast conservation surgery requiring preoperative radioactive seed
localization.

EXAM:
MAMMOGRAPHIC GUIDED RADIOACTIVE SEED LOCALIZATION OF THE LEFT BREAST

[L CC (1 of 5)]
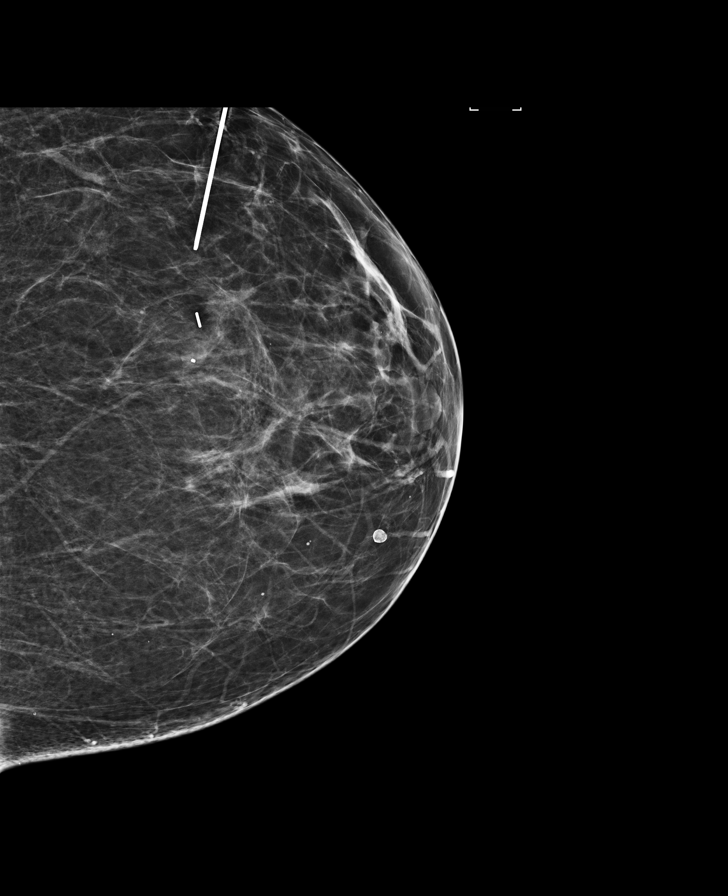

[L LM (1 of 3)]
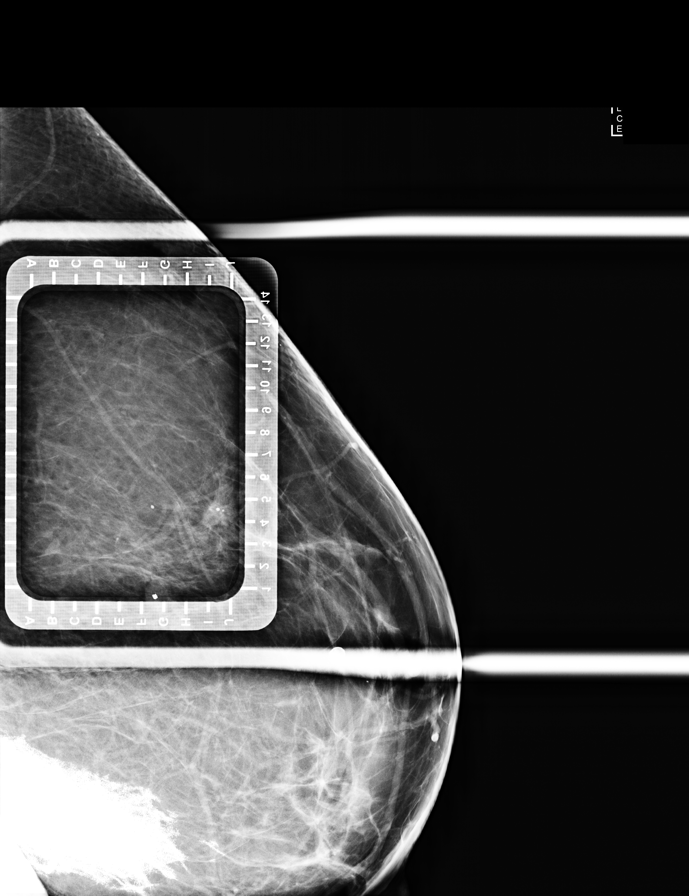

[L CC (2 of 5)]
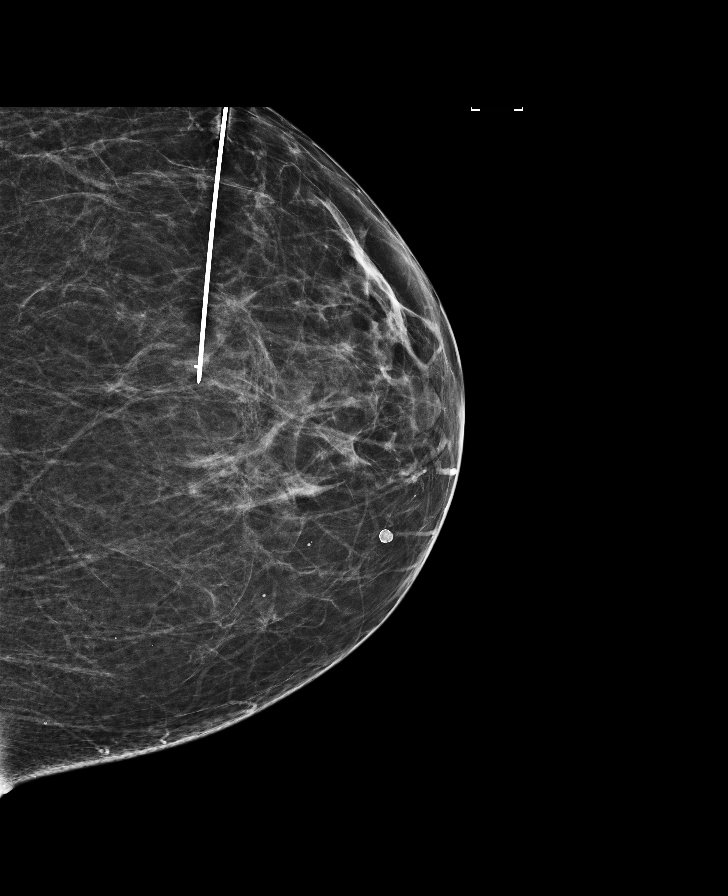

[L CC (3 of 5)]
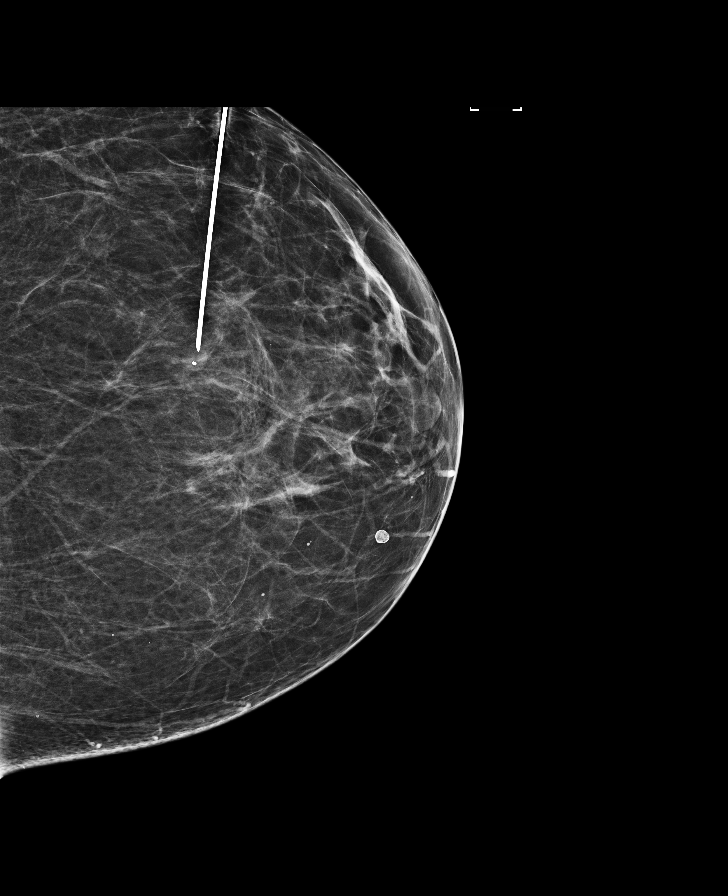

[L CC (4 of 5)]
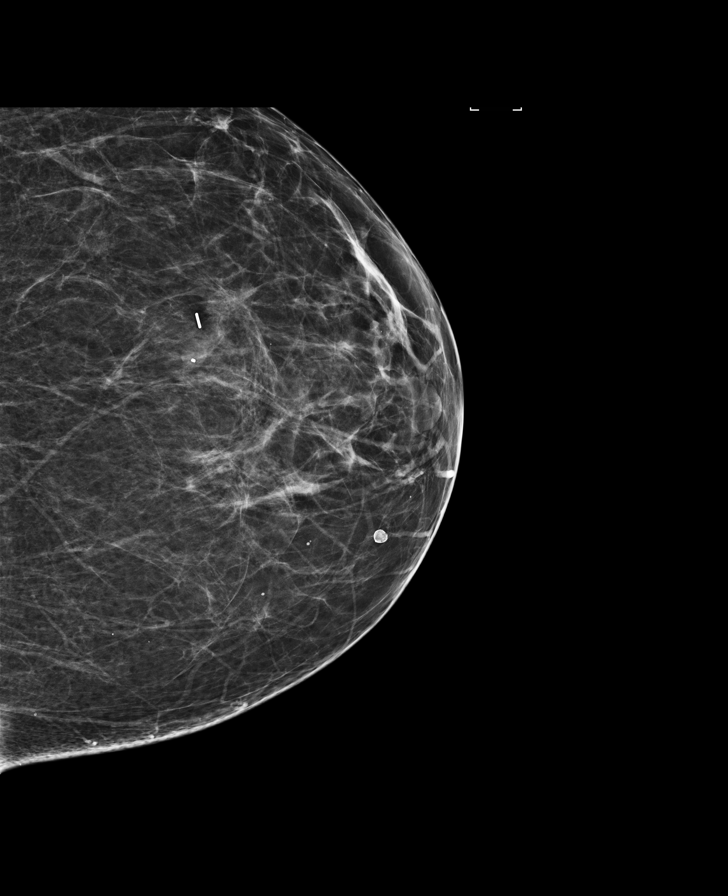

[L LM (2 of 3)]
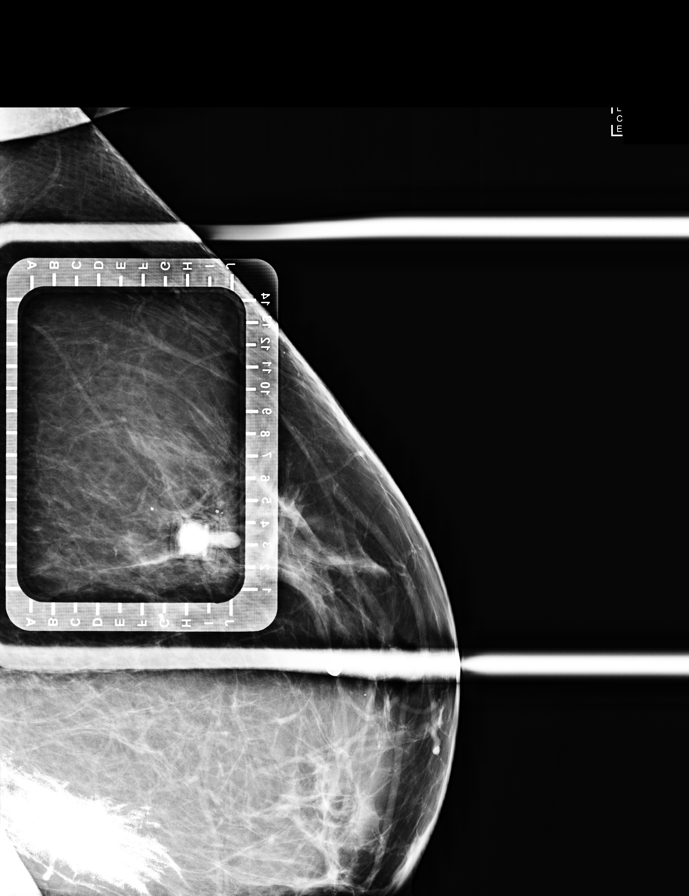

[L LM (3 of 3)]
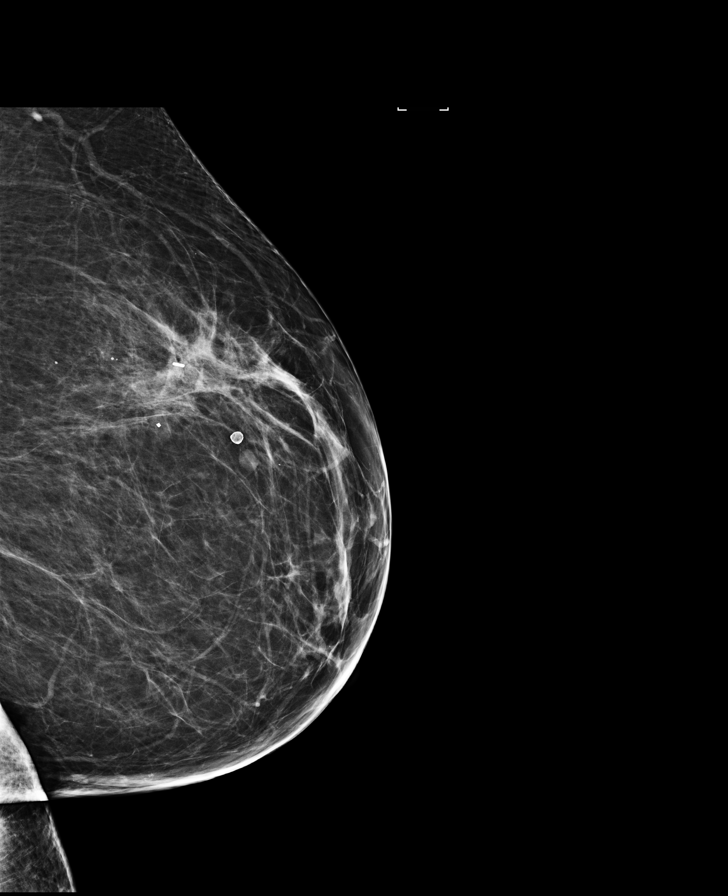

[L CC (5 of 5)]
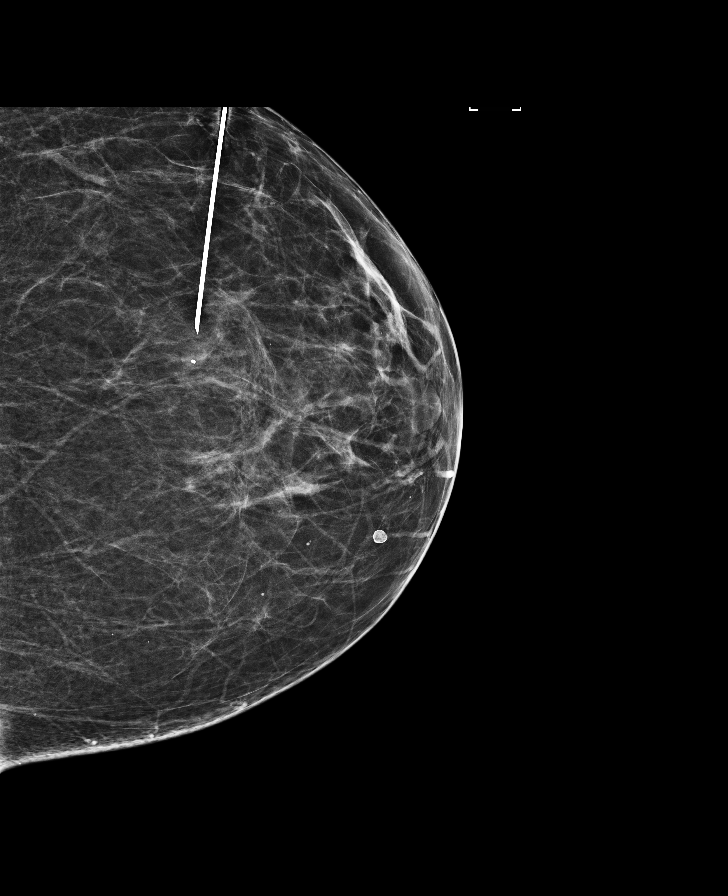

[8 of 9 positions shown; findings below may reference images not displayed]

FINDINGS: Patient presents for radioactive seed localization prior to breast
conservation surgery. I met with the patient and we discussed the
procedure of seed localization including benefits and alternatives.
We discussed the high likelihood of a successful procedure. We
discussed the risks of the procedure including infection, bleeding,
tissue injury and further surgery. We discussed the low dose of
radioactivity involved in the procedure. Informed, written consent
was given.

Case was reviewed with Dr. Jim per the procedure.

The usual time-out protocol was performed immediately prior to the
procedure.

Using mammographic guidance, sterile technique, 1% lidocaine and an
W-[id number] radioactive seed, the mass and underlying biopsy clip was
localized using a lateral approach. The follow-up mammogram images
confirm the seed in the expected location and were marked for Dr.
Jim.

Follow-up survey of the patient confirms presence of the radioactive
seed.

Order number of W-B0F seed:  [id number].

Total activity:  0.246 millicuries reference Date: 10/26/2019

The patient tolerated the procedure well and was released from the
[REDACTED]. She was given instructions regarding seed removal.
IMPRESSION: Radioactive seed localization left breast. No apparent
complications.

## 2021-05-18 NOTE — Progress Notes (Signed)
[place]  [phone] [phone]     ID: Lisa Cardenas DOB: 12-11-1950  MR#: [id number]  JME#:[id number]  Patient Care Team: Moshe Cipro, MD as PCP - General (Internal Medicine) Rolm Bookbinder, MD as Consulting Physician (General Surgery) Pratham Cassatt, Virgie Dad, MD as Consulting Physician (Oncology) Pyrtle, Lajuan Lines, MD as Consulting Physician (Gastroenterology) Rockwell Germany, RN as Oncology Nurse Navigator Mauro Kaufmann, RN as Oncology Nurse Navigator Zagol, Barnie Alderman, MD as Referring Physician (Cardiology) Malon Kindle, MD as Referring Physician (Neurology) Sarina Ill, MD as Referring Physician (Neurosurgery) Chauncey Cruel, MD OTHER MD:  CHIEF COMPLAINT: Estrogen receptor positive lobular breast cancer  CURRENT TREATMENT: Anastrozole   INTERVAL HISTORY: Lisa Cardenas" returns today for follow up of her estrogen receptor positive lobular breast cancer.   She began anastrozole on 02/26/2020.  She does not have significant issues with hot flashes or vaginal dryness but she is having significant pain in her hand joints.  She thinks she had this the first few months of anastrozole but it has gotten worse in the last 6 months.  Of course she also has started statins in that period of time and that complicates the assessment.  Her most recent bone density screening from 04/30/2020 showed a T-score of -0.5, which is considered normal.  Since her last visit, she underwent bilateral diagnostic mammography with tomography at [facility] on 05/02/2021 showing: breast density category B; no evidence of malignancy in either breast.   Of note, she has a history of trigeminal neuralgia, followed by Dr. Mickeal Skinner, and  underwent retromastoid craniotomy for microvascular decompression at [facility] 06/25/2020.  Since the surgery she has been pain-free although every day as she combs her hair she is terrified the pain is going to  come back.  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  She is no longer playing the piano because of that.  And she feels her sense of balance is poor.  She is afraid of falling, appropriately so.  She does have a walking stick but does not feel ready for a cane she says.  There have been no falls thankfully.  A detailed review of systems was otherwise stable.   COVID 19 VACCINATION STATUS: J&J x2; also had COVID February 2021   HISTORY OF CURRENT ILLNESS: From the original intake note:  Lisa Cardenas [pronounced [id number]] had routine screening mammography on 08/10/2019 showing a possible abnormality in the left breast. She underwent left diagnostic mammography with tomography and left breast ultrasonography in [place], [place] on 09/11/2019 showing: scattered fibroglandular densities asymmetry in upper-outer left breast with no definitive sonographic correlate.  Accordingly on 10/06/2019 she proceeded to biopsy of the left breast area in question. The pathology from this procedure ([id number] at [facility] in [place]) ) showed: invasive lobular carcinoma, E-cadherin negative, grade 1. Prognostic indicators significant for: estrogen receptor, greater than 90% positive and progesterone receptor, greater than 90% positive, both with strong staining intensity proliferation marker Ki67 at 5-7%. HER2 negative by immunohistochemistry.  The patient's subsequent history is as detailed below.   PAST MEDICAL HISTORY: Past Medical History:  Diagnosis Date   A-fib [facility])    no issues since May 2020   Arrhythmia    Cancer [facility])    Diverticulosis  Elevated cholesterol    Family history of breast cancer    Family history of melanoma    Family history of pancreatic cancer    Family history of prostate cancer    Hypertension     Obesity    Pancreatitis    Thyroid disease    Trigeminal neuralgia    Trigeminal neuralgia    UTI (urinary tract infection)   Chronic anticoagulation secondary to atrial fibrillation, GERD, irritable bowel syndrome   PAST SURGICAL HISTORY: Past Surgical History:  Procedure Laterality Date   ABDOMINAL HYSTERECTOMY  1980   BLADDER REPAIR     BREAST BIOPSY Left 11/2019   BREAST LUMPECTOMY Left 11/23/2019   BREAST LUMPECTOMY WITH RADIOACTIVE SEED AND SENTINEL LYMPH NODE BIOPSY Left 11/23/2019   Procedure: LEFT BREAST LUMPECTOMY WITH RADIOACTIVE SEED AND LEFT AXILLARY SENTINEL LYMPH NODE BIOPSY;  Surgeon: Rolm Bookbinder, MD;  Location: [facility];  Service: General;  Laterality: Left;  PEC BLOCK   [facility]   [facility]ENOSCOPY     [facility]   KNEE ARTHROSCOPY Left    TONSILLECTOMY    Ovaries still in place, right fallopian tube removed, status post cystocele repair   FAMILY HISTORY: Family History  Problem Relation Age of Onset   Colon cancer Mother        dx 26   Breast cancer Maternal Grandmother    Breast cancer Maternal Aunt    Prostate cancer Maternal Uncle    Breast cancer Paternal Aunt    Melanoma Paternal Uncle    Breast cancer Maternal Aunt    Leukemia Maternal Uncle    Breast cancer Paternal Aunt    Colon cancer Paternal 49    Melanoma Daughter   The family history is best documented in the genetics note but in brief the patient's father died at the age of 50 from sepsis and the patient's mother at the age of 73 from COPD.  The patient's mother had colon cancer.  The maternal grandmother had breast cancer in her late 74s and 2 maternal aunts had breast cancer and 1 maternal cousin had breast cancer and later developed pancreatic cancer.  On the father's side 2 paternal aunts had breast cancer, one paternal aunt had colon cancer, all older than 46 years old, 1 paternal uncle had leukemia and one paternal uncle had prostate  cancer   GYNECOLOGIC HISTORY:  No LMP recorded. Patient has had a hysterectomy. Menarche: 70 years old Age at first live birth: 70 years old [place] P 3 LMP hysterectomy age 28 Contraceptive used oral contraceptives only a few months, without complication HRT estrogen only for 7 to 10 years  Hysterectomy? yes BSO?  No   SOCIAL HISTORY: (updated November 2022)  Lisa Cardenas" retired from working as an Haematologist.  Her husband of more than 41 years, Clair Gulling, has a Oceanographer in Engineer, mining and work in Social research officer, government for [employer].  He is retired and now Health and safety inspector.  Daughter Cyril Mourning lives in [place] and owns and runs a center for pediatric treatment as well as a school for autistic children's.  Daughter Loree Fee in [place] has a Oceanographer in Animal nutritionist studies and teaches teaching at [employer]; daughter Janett Billow lives in [place] and works at [employer] base there.  The patient has 3 grandchildren.  Her grandson is currently in [place].  She is a member of the [employer] denomination.   ADVANCED DIRECTIVES: In the absence  of any documents to the contrary the patient's husband is her healthcare power of attorney   HEALTH MAINTENANCE: Social History   Tobacco Use   Smoking status: Never   Smokeless tobacco: Never  Vaping Use   Vaping Use: Never used  Substance Use Topics   Alcohol use: Not Currently   Drug use: Not Currently     Colonoscopy: October 2020/ Pyrtle  PAP: 2016  Bone density: Remote   Allergies  Allergen Reactions   Plaquenil [Hydroxychloroquine]     Increased heart rate    Current Outpatient Medications  Medication Sig Dispense Refill   amLODipine (NORVASC) 5 MG tablet Take 5 mg by mouth daily.     anastrozole (ARIMIDEX) 1 MG tablet TAKE 1 TABLET BY MOUTH EVERY DAY 90 tablet 1   celecoxib (CELEBREX) 200 MG capsule Take 200 mg by mouth every 30 (thirty) days.     esomeprazole (NEXIUM) 40 MG capsule Take 40 mg by  mouth 2 (two) times daily as needed.     gabapentin (NEURONTIN) 400 MG capsule Take 2 capsules (800 mg total) by mouth 2 (two) times daily. One in late morning and one at night 120 capsule 2   indomethacin (INDOCIN) 25 MG capsule Take 1 capsule (25 mg total) by mouth 3 (three) times daily with meals. 60 capsule 0   levothyroxine (SYNTHROID) 137 MCG tablet Take 137 mcg by mouth daily. Current dose 137 mcg daily     LORazepam (ATIVAN) 0.5 MG tablet Take 0.5 mg by mouth as needed for anxiety.     metoprolol succinate (TOPROL-XL) 100 MG 24 hr tablet Take 100 mg by mouth 2 (two) times daily.     olmesartan (BENICAR) 20 MG tablet Take 20 mg by mouth daily.     olmesartan-hydrochlorothiazide (BENICAR HCT) 20-12.5 MG tablet Take 1 tablet by mouth daily.     Oxcarbazepine (TRILEPTAL) 300 MG tablet Take 2 tablets (600 mg total) by mouth 2 (two) times daily. 120 tablet 2   pregabalin (LYRICA) 50 MG capsule Take 50 mg by mouth 2 (two) times daily.     primidone (MYSOLINE) 50 MG tablet Take 50 mg by mouth daily.      rivaroxaban (XARELTO) 20 MG TABS tablet Take 20 mg by mouth daily.     rosuvastatin (CRESTOR) 20 MG tablet Take 20 mg by mouth daily.     sertraline (ZOLOFT) 50 MG tablet Take 50 mg by mouth daily.     simvastatin (ZOCOR) 40 MG tablet Take 40 mg by mouth daily.     SUMAtriptan Succinate 3 MG/0.5ML SOAJ Inject 3 mg into the skin once for 1 dose. 0.5 mL 3   triamcinolone lotion (KENALOG) 0.1 % Apply 1 application topically 3 (three) times daily. Use for 2 weeks prn then stop for 1 week before restarting. 180 mL 0   Vitamin D, Ergocalciferol, (DRISDOL) 1.25 MG (50000 UNIT) CAPS capsule Take 50,000 Units by mouth once a week.     No current facility-administered medications for this visit.    OBJECTIVE: White woman who appears stated age  69:   05/19/21 1126  BP: (!) 141/79  Pulse: 60  Resp: 18  Temp: (!) 97.5 F (36.4 C)  SpO2: 96%      Body mass index is 42.81 kg/m.   Wt Readings  from Last 3 Encounters:  05/19/21 249 lb 6.4 oz (113.1 kg)  05/21/20 238 lb 6.4 oz (108.1 kg)  05/13/20 238 lb 4.8 oz (108.1 kg)  ECOG FS:1 - Symptomatic but completely ambulatory  Sclerae unicteric, EOMs intact Wearing a mask No cervical or supraclavicular adenopathy Lungs no rales or rhonchi Heart regular rate and rhythm Abd soft, nontender, positive bowel sounds MSK no focal spinal tenderness, no upper extremity lymphedema Neuro: Mild tremor of the head, no obvious resting tremor of the hands otherwise nonfocal, well oriented, appropriate affect Breasts: The right breast is benign.  The left breast is status postlumpectomy and radiation.  There is no evidence of local recurrence.  Both axillae are benign.    LAB RESULTS:  CMP     Component Value Date/Time   NA 140 05/13/2020 1108   K 3.9 05/13/2020 1108   CL 104 05/13/2020 1108   CO2 30 05/13/2020 1108   GLUCOSE 98 05/13/2020 1108   BUN 13 05/13/2020 1108   CREATININE 0.74 05/13/2020 1108   CREATININE 0.78 10/26/2019 1507   CALCIUM 10.2 05/13/2020 1108   PROT 7.0 05/13/2020 1108   ALBUMIN 4.0 05/13/2020 1108   AST 10 (L) 05/13/2020 1108   AST 11 (L) 10/26/2019 1507   ALT 18 05/13/2020 1108   ALT 15 10/26/2019 1507   ALKPHOS 68 05/13/2020 1108   BILITOT 0.7 05/13/2020 1108   BILITOT 0.4 10/26/2019 1507   GFRNONAA >60 05/13/2020 1108   GFRNONAA >60 10/26/2019 1507   GFRAA >60 11/20/2019 1100   GFRAA >60 10/26/2019 1507    No results found for: TOTALPROTELP, ALBUMINELP, A1GS, A2GS, BETS, BETA2SER, GAMS, MSPIKE, SPEI  Lab Results  Component Value Date   WBC 5.7 05/19/2021   NEUTROABS 4.3 05/19/2021   HGB 13.7 05/19/2021   HCT 42.5 05/19/2021   MCV 84.0 05/19/2021   PLT 202 05/19/2021    No results found for: LABCA2  No components found for: DXIPJA250  No results for input(s): INR in the last 168 hours.  No results found for: LABCA2  No results found for: NLZ767  No results found for:  HAL937  No results found for: TKW409  No results found for: CA2729  No components found for: HGQUANT  No results found for: CEA1 / No results found for: CEA1   No results found for: AFPTUMOR  No results found for: CHROMOGRNA  No results found for: KPAFRELGTCHN, LAMBDASER, KAPLAMBRATIO (kappa/lambda light chains)  No results found for: HGBA, HGBA2QUANT, HGBFQUANT, HGBSQUAN (Hemoglobinopathy evaluation)   No results found for: LDH  No results found for: IRON, TIBC, IRONPCTSAT (Iron and TIBC)  No results found for: FERRITIN  Urinalysis    Component Value Date/Time   COLORURINE YELLOW 05/13/2020 1202   APPEARANCEUR HAZY (A) 05/13/2020 1202   LABSPEC 1.012 05/13/2020 1202   PHURINE 7.0 05/13/2020 1202   GLUCOSEU NEGATIVE 05/13/2020 1202   HGBUR MODERATE (A) 05/13/2020 1202   BILIRUBINUR NEGATIVE 05/13/2020 1202   KETONESUR NEGATIVE 05/13/2020 1202   PROTEINUR NEGATIVE 05/13/2020 1202   NITRITE NEGATIVE 05/13/2020 1202   LEUKOCYTESUR LARGE (A) 05/13/2020 1202    STUDIES: MM DIAG BREAST TOMO BILATERAL  Result Date: 05/02/2021 CLINICAL DATA:  History of LEFT breast cancer in 2021 status post lumpectomy. EXAM: DIGITAL DIAGNOSTIC BILATERAL MAMMOGRAM WITH TOMOSYNTHESIS AND CAD TECHNIQUE: Bilateral digital diagnostic mammography and breast tomosynthesis was performed. The images were evaluated with computer-aided detection. COMPARISON:  Previous exam(s). ACR Breast Density Category b: There are scattered areas of fibroglandular density. FINDINGS: There are stable postsurgical changes within the LEFT breast. There are no new dominant masses, suspicious calcifications or secondary signs of malignancy within either breast. IMPRESSION: No evidence  of malignancy within either breast. Stable postsurgical changes within the LEFT breast. RECOMMENDATION: Bilateral diagnostic mammogram in 1 year. I have discussed the findings and recommendations with the patient. If applicable, a reminder  letter will be sent to the patient regarding the next appointment. BI-RADS CATEGORY  2: Benign. Electronically Signed   By: Franki Cabot M.D.   On: 05/02/2021 11:39    ELIGIBLE FOR AVAILABLE RESEARCH PROTOCOL:AET  ASSESSMENT: 70 y.o.  [place], [place] woman status post left breast overlapping sites biopsy 10/06/2019 for a clinical TXN0, stage IA invasive lobular carcinoma, grade 1, E-cadherin negative, estrogen and progesterone receptor positive, HER-2 not amplified, with an MIB-1 of 5-7%.  (a) breast MRI 11/07/2019 showed a 0.7 cm upper outer quadrant mass in the left breast with no other evidence of disease  (1) genetics testing 04/23/2021on the [facility] Breast Cancer STAT Panel + Common Hereditary Cancers Panel found no deleterious mutations in ATM, BRCA1, BRCA2, CDH1, CHEK2, PALB2, PTEN, STK11 and TP53, APC, ATM, AXIN2, BARD1, BMPR1A, BRCA1, BRCA2, BRIP1, CDH1, CDKN2A (p14ARF), CDKN2A (p16INK4a), CKD4, CHEK2, CTNNA1, DICER1, EPCAM (Deletion/duplication testing only), GREM1 (promoter region deletion/duplication testing only), KIT, MEN1, MLH1, MSH2, MSH3, MSH6, MUTYH, NBN, NF1, NHTL1, PALB2, PDGFRA, PMS2, POLD1, POLE, PTEN, RAD50, RAD51C, RAD51D, RNF43, SDHB, SDHC, SDHD, SMAD4, SMARCA4. STK11, TP53, TSC1, TSC2, and VHL.  The following genes were evaluated for sequence changes only: SDHA and HOXB13 c.251G>A variant only.  (2) status post left lumpectomy and sentinel lymph node sampling 11/23/2019 for a pT1b pN0, stage IA invasive lobular carcinoma, with negative margins  (a) 1 sentinel lymph node removed  (3) Oncotype score of 18 predicts a risk of recurrence outside the breast in the next 9 years of 5% if the patient's only systemic therapy is an antiestrogen for 5 years.  It predicts no benefit from chemotherapy.  (4) adjuvant radiation 01/02/2020 through 01/24/2020 Site Technique Total Dose (Gy) Dose per Fx (Gy) Completed Fx Beam Energies  Breast, Left: Breast_Lt 3D 42.56/42.56 2.66 16/16 10X, 15X    (5) started anastrozole 02/26/2020  (a) bone density scan October 2021 shows a T score of -0.5 (normal)   PLAN: [name] is now a year and a half out from definitive surgery for her breast cancer with no evidence of disease recurrence.  This is favorable.  She is tolerating anastrozole well.  I have a feeling that the discomfort in her hands is not going to be related.  It certainly could be statin, or it could be carpal tunnel related or simple arthritis.  The only way I know to tell is to stop the anastrozole for a month and that is what we are doing.  She will have a virtual visit with me in mid December and if she is feeling considerably better at that time then obviously we would have to switch to tamoxifen.  Otherwise it is going to be one of the other possibilities.  I was delighted that her trigeminal neuralgia has resolved and hopefully permanently so.  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  Otherwise she will return in 1 year for follow-up and she has chosen to follow-up with Dr. Burr Medico given [name]'s family and personal history regarding colon issues aside from the breast cancer.  Total encounter time 25 minutes.Sarajane Jews C. Aries Townley, MD 05/19/2021 11:32 AM Medical Oncology and Hematology [place] [address], [place] [address] Tel. [phone]    Fax. [phone]   This document serves as a record of services personally performed by Lurline Del, MD. It was created on his behalf by Wilburn Mylar, a trained medical scribe. The creation of this record is based on the scribe's personal observations and the provider's statements to them.   I, Lurline Del MD, have reviewed the above documentation for accuracy and completeness, and I agree with the above.   *Total Encounter Time as defined by the Centers for Medicare  and Medicaid Services includes, in addition to the face-to-face time of a patient visit (documented in the note above) non-face-to-face time: obtaining and reviewing outside history, ordering and reviewing medications, tests or procedures, care coordination (communications with other health care professionals or caregivers) and documentation in the medical record.

## 2021-05-19 ENCOUNTER — Inpatient Hospital Stay: Payer: Medicare Other

## 2021-05-19 ENCOUNTER — Inpatient Hospital Stay: Payer: Medicare Other | Attending: Oncology | Admitting: Oncology

## 2021-05-19 ENCOUNTER — Other Ambulatory Visit: Payer: Self-pay

## 2021-05-19 VITALS — BP 141/79 | HR 60 | Temp 97.5°F | Resp 18 | Ht 64.0 in | Wt 249.4 lb

## 2021-05-19 DIAGNOSIS — Z170 Estrogen receptor positive status [ER+]: Secondary | ICD-10-CM | POA: Diagnosis not present

## 2021-05-19 DIAGNOSIS — Z8616 Personal history of COVID-19: Secondary | ICD-10-CM | POA: Insufficient documentation

## 2021-05-19 DIAGNOSIS — C50412 Malignant neoplasm of upper-outer quadrant of left female breast: Secondary | ICD-10-CM | POA: Diagnosis not present

## 2021-05-19 DIAGNOSIS — Z803 Family history of malignant neoplasm of breast: Secondary | ICD-10-CM | POA: Insufficient documentation

## 2021-05-19 DIAGNOSIS — Z79811 Long term (current) use of aromatase inhibitors: Secondary | ICD-10-CM | POA: Insufficient documentation

## 2021-05-19 DIAGNOSIS — C50812 Malignant neoplasm of overlapping sites of left female breast: Secondary | ICD-10-CM | POA: Insufficient documentation

## 2021-05-19 DIAGNOSIS — Z808 Family history of malignant neoplasm of other organs or systems: Secondary | ICD-10-CM | POA: Insufficient documentation

## 2021-05-19 DIAGNOSIS — Z800 Family history of malignant neoplasm of digestive organs: Secondary | ICD-10-CM | POA: Insufficient documentation

## 2021-05-19 LAB — CBC WITH DIFFERENTIAL/PLATELET
Abs Immature Granulocytes: 0.02 10*3/uL (ref 0.00–0.07)
Basophils Absolute: 0 10*3/uL (ref 0.0–0.1)
Basophils Relative: 1 %
Eosinophils Absolute: 0.2 10*3/uL (ref 0.0–0.5)
Eosinophils Relative: 4 %
HCT: 42.5 % (ref 36.0–46.0)
Hemoglobin: 13.7 g/dL (ref 12.0–15.0)
Immature Granulocytes: 0 %
Lymphocytes Relative: 15 %
Lymphs Abs: 0.9 10*3/uL (ref 0.7–4.0)
MCH: 27.1 pg (ref 26.0–34.0)
MCHC: 32.2 g/dL (ref 30.0–36.0)
MCV: 84 fL (ref 80.0–100.0)
Monocytes Absolute: 0.3 10*3/uL (ref 0.1–1.0)
Monocytes Relative: 6 %
Neutro Abs: 4.3 10*3/uL (ref 1.7–7.7)
Neutrophils Relative %: 74 %
Platelets: 202 10*3/uL (ref 150–400)
RBC: 5.06 MIL/uL (ref 3.87–5.11)
RDW: 15 % (ref 11.5–15.5)
WBC: 5.7 10*3/uL (ref 4.0–10.5)
nRBC: 0 % (ref 0.0–0.2)

## 2021-05-19 LAB — COMPREHENSIVE METABOLIC PANEL
ALT: 32 U/L (ref 0–44)
AST: 16 U/L (ref 15–41)
Albumin: 4 g/dL (ref 3.5–5.0)
Alkaline Phosphatase: 87 U/L (ref 38–126)
Anion gap: 8 (ref 5–15)
BUN: 17 mg/dL (ref 8–23)
CO2: 28 mmol/L (ref 22–32)
Calcium: 9.5 mg/dL (ref 8.9–10.3)
Chloride: 104 mmol/L (ref 98–111)
Creatinine, Ser: 0.75 mg/dL (ref 0.44–1.00)
GFR, Estimated: >60 mL/min (ref >60)
Glucose, Bld: 108 mg/dL — ABNORMAL HIGH (ref 70–99)
Potassium: 4.1 mmol/L (ref 3.5–5.1)
Sodium: 140 mmol/L (ref 135–145)
Total Bilirubin: 0.6 mg/dL (ref 0.3–1.2)
Total Protein: 6.9 g/dL (ref 6.5–8.1)

## 2021-06-08 ENCOUNTER — Other Ambulatory Visit: Payer: Self-pay | Admitting: Oncology

## 2021-06-30 NOTE — Progress Notes (Signed)
[place]  [phone] [phone]    ID: Lisa Cardenas DOB: 09-21-1950  MR#: [id number]  LNL#:[id number]  Patient Care Team: Moshe Cipro, MD as PCP - General (Internal Medicine) Rolm Bookbinder, MD as Consulting Physician (General Surgery) Damonta Cossey, Virgie Dad, MD as Consulting Physician (Oncology) Pyrtle, Lajuan Lines, MD as Consulting Physician (Gastroenterology) Rockwell Germany, RN as Oncology Nurse Navigator Mauro Kaufmann, RN as Oncology Nurse Navigator Zagol, Barnie Alderman, MD as Referring Physician (Cardiology) Malon Kindle, MD as Referring Physician (Neurology) Sarina Ill, MD as Referring Physician (Neurosurgery) Chauncey Cruel, MD OTHER MD:  I connected with Lisa Cardenas on 07/01/21 at 12:35 PM EST by telephone visit and verified that I am speaking with the correct person using two identifiers.   I discussed the limitations, risks, security and privacy concerns of performing an evaluation and management service by telemedicine and the availability of in-person appointments. I also discussed with the patient that there may be a patient responsible charge related to this service. The patient expressed understanding and agreed to proceed.   Other persons participating in the visit and their role in the encounter: None  Patients location: Home Providers location: [facility]   I provided 15 minutes of non face-to-face telephone visit time during this encounter, and > 50% was spent counseling as documented under my assessment & plan.   CHIEF COMPLAINT: Estrogen receptor positive lobular breast cancer  CURRENT TREATMENT: Anastrozole   INTERVAL HISTORY: Lisa "[name]" was contacted today for follow up of her estrogen receptor positive lobular breast cancer.   She began anastrozole on 02/26/2020.  At the ER visit November 2022 she reported arthralgias or myalgias involving her knees ankles feet  and the joints in her hands.  She went off the anastrozole as a trial.  That has made absolutely no difference.  Accordingly she is going back on anastrozole.  She thinks her new statin, Lipitor, may be the problem.  Her most recent bone density screening from 04/30/2020 showed a T-score of -0.5, which is considered normal.   REVIEW OF SYSTEMS: A detailed review of systems today was otherwise stable   COVID 19 VACCINATION STATUS: J&J x2; also had COVID February 2021   HISTORY OF CURRENT ILLNESS: From the original intake note:  Lisa Cardenas [pronounced [id number]] had routine screening mammography on 08/10/2019 showing a possible abnormality in the left breast. She underwent left diagnostic mammography with tomography and left breast ultrasonography in [place], [place] on 09/11/2019 showing: scattered fibroglandular densities asymmetry in upper-outer left breast with no definitive sonographic correlate.  Accordingly on 10/06/2019 she proceeded to biopsy of the left breast area in question. The pathology from this procedure ([id number] at [facility] in [place]) ) showed: invasive lobular carcinoma, E-cadherin negative, grade 1. Prognostic indicators significant for: estrogen receptor, greater than 90% positive and progesterone receptor, greater than 90% positive, both with strong staining intensity proliferation marker Ki67 at 5-7%. HER2 negative by immunohistochemistry.  The patient's subsequent history is as detailed below.   PAST MEDICAL HISTORY: Past Medical History:  Diagnosis Date   A-fib [facility])    no issues since May 2020   Arrhythmia    Cancer ([place])    Diverticulosis    Elevated cholesterol    Family history of breast cancer    Family history of melanoma    Family history of pancreatic cancer    Family history of prostate cancer    Hypertension  Obesity    Pancreatitis    Thyroid disease    Trigeminal neuralgia    Trigeminal neuralgia    UTI (urinary tract infection)    Chronic anticoagulation secondary to atrial fibrillation, GERD, irritable bowel syndrome   PAST SURGICAL HISTORY: Past Surgical History:  Procedure Laterality Date   ABDOMINAL HYSTERECTOMY  1980   BLADDER REPAIR     BREAST BIOPSY Left 11/2019   BREAST LUMPECTOMY Left 11/23/2019   BREAST LUMPECTOMY WITH RADIOACTIVE SEED AND SENTINEL LYMPH NODE BIOPSY Left 11/23/2019   Procedure: LEFT BREAST LUMPECTOMY WITH RADIOACTIVE SEED AND LEFT AXILLARY SENTINEL LYMPH NODE BIOPSY;  Surgeon: Rolm Bookbinder, MD;  Location: [facility];  Service: General;  Laterality: Left;  PEC BLOCK   [facility]   [facility]   KNEE ARTHROSCOPY Left    TONSILLECTOMY    Ovaries still in place, right fallopian tube removed, status post cystocele repair   FAMILY HISTORY: Family History  Problem Relation Age of Onset   Colon cancer Mother        dx 72   Breast cancer Maternal Grandmother    Breast cancer Maternal Aunt    Prostate cancer Maternal Uncle    Breast cancer Paternal Aunt    Melanoma Paternal Uncle    Breast cancer Maternal Aunt    Leukemia Maternal Uncle    Breast cancer Paternal Aunt    Colon cancer Paternal 45    Melanoma Daughter   The family history is best documented in the genetics note but in brief the patient's father died at the age of 48 from sepsis and the patient's mother at the age of 68 from COPD.  The patient's mother had colon cancer.  The maternal grandmother had breast cancer in her late 51s and 2 maternal aunts had breast cancer and 1 maternal cousin had breast cancer and later developed pancreatic cancer.  On the father's side 2 paternal aunts had breast cancer, one paternal aunt had colon cancer, all older than 54 years old, 1 paternal uncle had leukemia and one paternal uncle had prostate cancer   GYNECOLOGIC HISTORY:  No LMP recorded. Patient has had a hysterectomy. Menarche: 70 years old Age at first live birth: 70 years  old [place] P 3 LMP hysterectomy age 11 Contraceptive used oral contraceptives only a few months, without complication HRT estrogen only for 7 to 10 years  Hysterectomy? yes BSO?  No   SOCIAL HISTORY: (updated November 2022)  [name]" retired from working as an Haematologist.  Her husband of more than 62 years, Clair Gulling, has a Oceanographer in Engineer, mining and work in Social research officer, government for [employer].  He is retired and now Health and safety inspector.  Daughter Cyril Mourning lives in [place] and owns and runs a center for pediatric treatment as well as a school for autistic children's.  Daughter Loree Fee in [place] has a Oceanographer in Animal nutritionist studies and teaches teaching at [employer]; daughter Janett Billow lives in [place] and works at [employer] base there.  The patient has 3 grandchildren.  Her grandson is currently in [place].  She is a member of the [employer] denomination.   ADVANCED DIRECTIVES: In the absence of any documents to the contrary the patient's husband is her healthcare power of attorney   HEALTH MAINTENANCE: Social History   Tobacco Use   Smoking status: Never   Smokeless tobacco: Never  Vaping Use  Vaping Use: Never used  Substance Use Topics   Alcohol use: Not Currently   Drug use: Not Currently     Colonoscopy: October 2020/ Pyrtle  PAP: 2016  Bone density: Remote   Allergies  Allergen Reactions   Plaquenil [Hydroxychloroquine]     Increased heart rate    Current Outpatient Medications  Medication Sig Dispense Refill   amLODipine (NORVASC) 5 MG tablet Take 5 mg by mouth daily.     anastrozole (ARIMIDEX) 1 MG tablet TAKE 1 TABLET BY MOUTH EVERY DAY 90 tablet 1   atorvastatin (LIPITOR) 20 MG tablet Take 1 tablet (20 mg total) by mouth daily.     celecoxib (CELEBREX) 200 MG capsule Take 200 mg by mouth every 30 (thirty) days.     esomeprazole (NEXIUM) 40 MG capsule Take 40 mg by mouth 2 (two) times daily as needed.     levothyroxine  (SYNTHROID) 137 MCG tablet Take 137 mcg by mouth daily. Current dose 137 mcg daily     LORazepam (ATIVAN) 0.5 MG tablet Take 0.5 mg by mouth as needed for anxiety.     metoprolol succinate (TOPROL-XL) 100 MG 24 hr tablet Take 100 mg by mouth 2 (two) times daily.     olmesartan (BENICAR) 20 MG tablet Take 20 mg by mouth daily.     pregabalin (LYRICA) 50 MG capsule Take 50 mg by mouth 2 (two) times daily.     primidone (MYSOLINE) 50 MG tablet Take 50 mg by mouth daily.      rivaroxaban (XARELTO) 20 MG TABS tablet Take 20 mg by mouth daily.     rosuvastatin (CRESTOR) 20 MG tablet Take 20 mg by mouth daily.     sertraline (ZOLOFT) 50 MG tablet Take 50 mg by mouth daily.     SUMAtriptan Succinate 3 MG/0.5ML SOAJ Inject 3 mg into the skin once for 1 dose. 0.5 mL 3   triamcinolone lotion (KENALOG) 0.1 % Apply 1 application topically 3 (three) times daily. Use for 2 weeks prn then stop for 1 week before restarting. 180 mL 0   Vitamin D, Ergocalciferol, (DRISDOL) 1.25 MG (50000 UNIT) CAPS capsule Take 50,000 Units by mouth once a week.     No current facility-administered medications for this visit.    OBJECTIVE:   There were no vitals filed for this visit.     There is no height or weight on file to calculate BMI.   Wt Readings from Last 3 Encounters:  05/19/21 249 lb 6.4 oz (113.1 kg)  05/21/20 238 lb 6.4 oz (108.1 kg)  05/13/20 238 lb 4.8 oz (108.1 kg)     ECOG FS:1 - Symptomatic but completely ambulatory  Telemedicine visit 07/01/2021     LAB RESULTS:  CMP     Component Value Date/Time   NA 140 05/19/2021 1121   K 4.1 05/19/2021 1121   CL 104 05/19/2021 1121   CO2 28 05/19/2021 1121   GLUCOSE 108 (H) 05/19/2021 1121   BUN 17 05/19/2021 1121   CREATININE 0.75 05/19/2021 1121   CREATININE 0.78 10/26/2019 1507   CALCIUM 9.5 05/19/2021 1121   PROT 6.9 05/19/2021 1121   ALBUMIN 4.0 05/19/2021 1121   AST 16 05/19/2021 1121   AST 11 (L) 10/26/2019 1507   ALT 32 05/19/2021 1121    ALT 15 10/26/2019 1507   ALKPHOS 87 05/19/2021 1121   BILITOT 0.6 05/19/2021 1121   BILITOT 0.4 10/26/2019 1507   GFRNONAA >60 05/19/2021 1121   GFRNONAA >60 10/26/2019  1507   GFRAA >60 11/20/2019 1100   GFRAA >60 10/26/2019 1507    No results found for: TOTALPROTELP, ALBUMINELP, A1GS, A2GS, BETS, BETA2SER, GAMS, MSPIKE, SPEI  Lab Results  Component Value Date   WBC 5.7 05/19/2021   NEUTROABS 4.3 05/19/2021   HGB 13.7 05/19/2021   HCT 42.5 05/19/2021   MCV 84.0 05/19/2021   PLT 202 05/19/2021    No results found for: LABCA2  No components found for: LOVFIE332  No results for input(s): INR in the last 168 hours.  No results found for: LABCA2  No results found for: RJJ884  No results found for: ZYS063  No results found for: KZS010  No results found for: CA2729  No components found for: HGQUANT  No results found for: CEA1 / No results found for: CEA1   No results found for: AFPTUMOR  No results found for: CHROMOGRNA  No results found for: KPAFRELGTCHN, LAMBDASER, KAPLAMBRATIO (kappa/lambda light chains)  No results found for: HGBA, HGBA2QUANT, HGBFQUANT, HGBSQUAN (Hemoglobinopathy evaluation)   No results found for: LDH  No results found for: IRON, TIBC, IRONPCTSAT (Iron and TIBC)  No results found for: FERRITIN  Urinalysis    Component Value Date/Time   COLORURINE YELLOW 05/13/2020 1202   APPEARANCEUR HAZY (A) 05/13/2020 1202   LABSPEC 1.012 05/13/2020 1202   PHURINE 7.0 05/13/2020 1202   GLUCOSEU NEGATIVE 05/13/2020 1202   HGBUR MODERATE (A) 05/13/2020 1202   BILIRUBINUR NEGATIVE 05/13/2020 1202   KETONESUR NEGATIVE 05/13/2020 1202   PROTEINUR NEGATIVE 05/13/2020 1202   NITRITE NEGATIVE 05/13/2020 1202   LEUKOCYTESUR LARGE (A) 05/13/2020 1202    STUDIES: No results found.   ELIGIBLE FOR AVAILABLE RESEARCH PROTOCOL:AET  ASSESSMENT: 70 y.o.  [place], [place] woman status post left breast overlapping sites biopsy 10/06/2019 for a clinical  TXN0, stage IA invasive lobular carcinoma, grade 1, E-cadherin negative, estrogen and progesterone receptor positive, HER-2 not amplified, with an MIB-1 of 5-7%.  (a) breast MRI 11/07/2019 showed a 0.7 cm upper outer quadrant mass in the left breast with no other evidence of disease  (1) genetics testing 04/23/2021on the [facility] Breast Cancer STAT Panel + Common Hereditary Cancers Panel found no deleterious mutations in ATM, BRCA1, BRCA2, CDH1, CHEK2, PALB2, PTEN, STK11 and TP53, APC, ATM, AXIN2, BARD1, BMPR1A, BRCA1, BRCA2, BRIP1, CDH1, CDKN2A (p14ARF), CDKN2A (p16INK4a), CKD4, CHEK2, CTNNA1, DICER1, EPCAM (Deletion/duplication testing only), GREM1 (promoter region deletion/duplication testing only), KIT, MEN1, MLH1, MSH2, MSH3, MSH6, MUTYH, NBN, NF1, NHTL1, PALB2, PDGFRA, PMS2, POLD1, POLE, PTEN, RAD50, RAD51C, RAD51D, RNF43, SDHB, SDHC, SDHD, SMAD4, SMARCA4. STK11, TP53, TSC1, TSC2, and VHL.  The following genes were evaluated for sequence changes only: SDHA and HOXB13 c.251G>A variant only.  (2) status post left lumpectomy and sentinel lymph node sampling 11/23/2019 for a pT1b pN0, stage IA invasive lobular carcinoma, with negative margins  (a) 1 sentinel lymph node removed  (3) Oncotype score of 18 predicts a risk of recurrence outside the breast in the next 9 years of 5% if the patient's only systemic therapy is an antiestrogen for 5 years.  It predicts no benefit from chemotherapy.  (4) adjuvant radiation 01/02/2020 through 01/24/2020 Site Technique Total Dose (Gy) Dose per Fx (Gy) Completed Fx Beam Energies  Breast, Left: Breast_Lt 3D 42.56/42.56 2.66 16/16 10X, 15X   (5) started anastrozole 02/26/2020  (a) bone density scan October 2021 shows a T score of -0.5 (normal)  (b) no improvements on arthralgias/myalgias after a month of anastrozole   PLAN: [name] took a vacation from  anastrozole without any improvement in her symptoms so she is going back on that medication at present.  She is  going to stop the Lipitor.  She will be off it for about 6 weeks and then return to see [place].  At that point she and her medical oncologist can decide how to proceed.  Ideally we want her to be able to take an antiestrogen and cholesterol-lowering agent that does not cause her significant side effects and she may take it for several years as generally indicated.  I encouraged her to call us with any other issues that may develop before that next visit.   Virgie Dad. Magrinat, MD 07/01/2021 12:36 PM Medical Oncology and Hematology [facility] [place], [place] [address] Tel. [phone]    Fax. [phone]   This document serves as a record of services personally performed by Lurline Del, MD. It was created on his behalf by Wilburn Mylar, a trained medical scribe. The creation of this record is based on the scribe's personal observations and the provider's statements to them.   I, Lurline Del MD, have reviewed the above documentation for accuracy and completeness, and I agree with the above.   *Total Encounter Time as defined by the Centers for Medicare and Medicaid Services includes, in addition to the face-to-face time of a patient visit (documented in the note above) non-face-to-face time: obtaining and reviewing outside history, ordering and reviewing medications, tests or procedures, care coordination (communications with other health care professionals or caregivers) and documentation in the medical record.

## 2021-07-01 ENCOUNTER — Inpatient Hospital Stay: Payer: Medicare Other | Attending: Oncology | Admitting: Oncology

## 2021-07-01 DIAGNOSIS — C50412 Malignant neoplasm of upper-outer quadrant of left female breast: Secondary | ICD-10-CM

## 2021-07-01 DIAGNOSIS — Z170 Estrogen receptor positive status [ER+]: Secondary | ICD-10-CM

## 2021-07-23 LAB — LAB REPORT - SCANNED: EGFR: 60

## 2021-12-03 ENCOUNTER — Other Ambulatory Visit: Payer: Self-pay

## 2022-01-19 LAB — LAB REPORT - SCANNED: EGFR: 86

## 2022-02-06 ENCOUNTER — Telehealth: Payer: Self-pay | Admitting: Hematology

## 2022-02-06 NOTE — Telephone Encounter (Signed)
.  Called patient to schedule appointment per 7/27 inbasket, patient is aware of date and time.   

## 2022-03-23 ENCOUNTER — Telehealth: Payer: Self-pay | Admitting: Internal Medicine

## 2022-03-23 NOTE — Telephone Encounter (Signed)
Inbound call from patient stating that she wanted to schedule her 3 year follow up with Dr. Hilarie Fredrickson. I asked patient if she was talking about an office visit or her colonoscopy. Patient was unsure of what she needed to schedule for. Patient does have a recall in for November but is wanting to clarify with the nurse that it is correct. Please advise.

## 2022-03-24 NOTE — Telephone Encounter (Signed)
Dear Mrs. Lisa Cardenas,  The polyps removed from your colon were benign, but precancerous. This means that they had the potential to change into cancer over time had they not been removed.  I recommend you have a repeat colonoscopy in 3 years to determine if you have developed any new polyps and to screen for colorectal cancer.   Please let me know if you continue to have issues with abdominal pain or abnormal bowel habits.  If you develop any new rectal bleeding, abdominal pain or significant bowel habit changes, please contact us before then at Dept: [phone].  Please call us if you have persistent problems or have questions about your condition that have not been fully answered at this time.  Sincerely,  Jerene Bears, MD   Pt just needs recall colon.

## 2022-04-22 NOTE — Progress Notes (Signed)
04/23/2022 Lisa Cardenas [id number] [birth year]  Referring provider: Moshe Cipro, MD Primary GI doctor: Dr. Hilarie Fredrickson  ASSESSMENT AND PLAN:  Worsening constipation Likely medication related with switch form toprol to sotalol, does not tolerate miralax well - Increase fiber/ water intake, decrease caffeine, increase activity level. - linzess samples given 72 mcg - instructions how to take given to patient in AVS -can take after Xray results are back to rule out obstruction - patient is due for colonoscopy, will schedule at this time We have discussed the risks of bleeding, infection, perforation, medication reactions, and remote risk of death associated with colonoscopy. All questions were answered and the patient acknowledges these risk and wishes to proceed.  Gastroesophageal reflux history with nausea, on celebrex and chronic anticoagulation, last EGD 2012 Will recheck labs but has had worsening nausea, will schedule for EGD with colon to evaluate for gastritis, ulcers, H pylori.  Discussed risk with patient and she agrees If negative EGD, plan for RUQ [place]  Afib on chronic anticoagulation Patient told to hold her Xarelto for 2 days prior to time of procedure. Will instruct when and how to resume after procedure. We will communicate with her prescribing physician Dr. Eliezer Bottom in [place] to ensure that holding his Xarelto is acceptable. We discussed the risk, benefits and alternatives to colonoscopy/endoscopy.  We also discussed the low but real risk of cardiovascular event such as heart attack, stroke, embolism, thrombosis or ischemia/infarct of other organs off Xarelto and explained the need to seek urgent help if this occurs.  She is agreeable and wishes to proceed.  Morbid obesity with BMI of 40.0-44.9, adult (HCC) Body mass index is 44.35 kg/m.  -Patient has been advised to make an attempt to improve diet and exercise patterns to aid in weight loss. -Recommended diet  heavy in fruits and veggies and low in animal meats, cheeses, and dairy products, appropriate calorie intake  History of adenomatous polyp of colon 05/25/2019 colonoscopy for high risk screening, first-degree relative with colorectal cancer before age 52, last colonoscopy 2012, good prep: 3 polyps 3 to 5 mm in size in the cecum, 1 polyp 4 mm ileocecal valve, 2 polyps 4 to 5 mm ascending colon, 2 polyps 3 to 4 mm in size descending colon, moderate diverticulosis, internal hemorrhoids. Biopsy showed all 8 polyps were tubular adenomas versus sessile serrated polyps recall 3 years ( 05/2022).  History of Present Illness:  71 y.o. female  with a past medical history of morbid obesity, history of left breast cancer 2021, A-fib on Xarelto, echo 2018 normal EF, and others listed below, returns to clinic today for evaluation of constipation/hemorrhoids/abdominal pain.  05/25/2019 colonoscopy for high risk screening, first-degree relative with colorectal cancer before age 53, last colonoscopy 2012, good prep: 3 polyps 3 to 5 mm in size in the cecum, 1 polyp 4 mm ileocecal valve, 2 polyps 4 to 5 mm ascending colon, 2 polyps 3 to 4 mm in size descending colon, moderate diverticulosis, internal hemorrhoids. Biopsy showed all 8 polyps were tubular adenomas versus sessile serrated polyps recall 3 years ( 05/2022).  She states she has had worsening constipation several months ago.  Would have intermittent diarrhea during that time.  She started on metamucil daily, miralax as needed but can cause nausea.  She was switched from toprol to sotalol for better Afib control, some correlation in the constipation.  She states also during that time was not moving as much and diet was not as good.  She has increased  water, fiber and states the BM's have improved some.  She had abscess on her left buttocks/rectum, had something similar years ago. She can go 3-4 days between BM's, still not as normal as she would like but  this AM was better.  She never had rectal bleeding but could feel her hemorrhoids when the constipation was bad, this has improved. Denies weight loss, states it can be up and down but stays steady.  She has AB cramping lower, better after BM.   She has GERD even with the nexium 40 mg twice a day.  She has some nausea at time, will take zofran that helps as needed, takes a few times a week. No vomiting.  Worse with certain foods, worse if she eats after 6 pm or late at night.  No dysphagia, no melena.  She is on celebrex once a week, no ETOH no smoking.  Had EGD 09/17/2010 in [place], with gastric erosions and possible barret's.  Dr. Eliezer Bottom in [place] is her cardiologist, prescribes the [place].  Has 3 daughters, 2 red heads.   She  reports that she has never smoked. She has never used smokeless tobacco. She reports that she does not currently use alcohol. She reports that she does not currently use drugs. Her family history includes Breast cancer in her maternal aunt, maternal aunt, maternal grandmother, paternal aunt, and paternal aunt; Colon cancer in her mother and paternal aunt; Leukemia in her maternal uncle; Melanoma in her daughter and paternal uncle; Prostate cancer in her maternal uncle.   Current Medications:   Current Outpatient Medications (Endocrine & Metabolic):    levothyroxine (SYNTHROID) 137 MCG tablet, Take 137 mcg by mouth daily. Current dose 137 mcg daily  Current Outpatient Medications (Cardiovascular):    amLODipine (NORVASC) 5 MG tablet, Take 5 mg by mouth daily.   olmesartan (BENICAR) 20 MG tablet, Take 20 mg by mouth daily.   rosuvastatin (CRESTOR) 20 MG tablet, Take 20 mg by mouth daily.   metoprolol succinate (TOPROL-XL) 100 MG 24 hr tablet, Take 100 mg by mouth 2 (two) times daily.   Current Outpatient Medications (Analgesics):    celecoxib (CELEBREX) 200 MG capsule, Take 200 mg by mouth every 30 (thirty) days.   SUMAtriptan Succinate 3 MG/0.5ML SOAJ,  Inject 3 mg into the skin once for 1 dose.  Current Outpatient Medications (Hematological):    rivaroxaban (XARELTO) 20 MG TABS tablet, Take 20 mg by mouth daily.  Current Outpatient Medications (Other):    anastrozole (ARIMIDEX) 1 MG tablet, TAKE 1 TABLET BY MOUTH EVERY DAY   esomeprazole (NEXIUM) 40 MG capsule, Take 40 mg by mouth 2 (two) times daily as needed.   LORazepam (ATIVAN) 0.5 MG tablet, Take 0.5 mg by mouth as needed for anxiety.   pregabalin (LYRICA) 50 MG capsule, Take 50 mg by mouth 2 (two) times daily.   primidone (MYSOLINE) 50 MG tablet, Take 50 mg by mouth daily.    sertraline (ZOLOFT) 50 MG tablet, Take 50 mg by mouth daily.   triamcinolone lotion (KENALOG) 0.1 %, Apply 1 application topically 3 (three) times daily. Use for 2 weeks prn then stop for 1 week before restarting. (Patient taking differently: Apply 1 application  topically 3 (three) times daily. Use for 2 weeks prn then stop for 1 week before restarting. As needed)   Vitamin D, Ergocalciferol, (DRISDOL) 1.25 MG (50000 UNIT) CAPS capsule, Take 50,000 Units by mouth once a week.  Surgical History:  She  has a past surgical history that  includes Abdominal hysterectomy (1980); Tonsillectomy; Knee arthroscopy (Left); Colonoscopy; Esophagogastroduodenoscopy; Bladder repair; Breast lumpectomy with radioactive seed and sentinel lymph node biopsy (Left, 11/23/2019); Breast lumpectomy (Left, 11/23/2019); and Breast biopsy (Left, 11/2019).  Current Medications, Allergies, Past Medical History, Past Surgical History, Family History and Social History were reviewed in [employer] record.  Physical Exam: BP 118/62   Pulse 62   Ht '5\' 4"'$  (1.626 m)   Wt 258 lb 6 oz (117.2 kg)   BMI 44.35 kg/m  General:   Pleasant, well developed female in no acute distress Heart : Regular rate and rhythm; no murmurs Pulm: Clear anteriorly; no wheezing Abdomen:  Soft, Obese AB, Sluggish bowel sounds. mild tenderness in  the epigastrium. Without guarding and Without rebound, No organomegaly appreciated. Rectal: declines Extremities:  without  edema. Neurologic:  Alert and  oriented x4;  No focal deficits.  Psych:  Cooperative. Normal mood and affect.   Vladimir Crofts, PA-C 04/23/22

## 2022-04-23 ENCOUNTER — Telehealth: Payer: Self-pay

## 2022-04-23 ENCOUNTER — Ambulatory Visit (INDEPENDENT_AMBULATORY_CARE_PROVIDER_SITE_OTHER)
Admission: RE | Admit: 2022-04-23 | Discharge: 2022-04-23 | Disposition: A | Discharge status: Home / Self Care | Payer: Medicare Other | Source: Ambulatory Visit | Attending: Physician Assistant | Admitting: Physician Assistant

## 2022-04-23 ENCOUNTER — Encounter: Payer: Self-pay | Admitting: Physician Assistant

## 2022-04-23 ENCOUNTER — Ambulatory Visit (INDEPENDENT_AMBULATORY_CARE_PROVIDER_SITE_OTHER): Payer: Medicare Other | Admitting: Physician Assistant

## 2022-04-23 ENCOUNTER — Other Ambulatory Visit (INDEPENDENT_AMBULATORY_CARE_PROVIDER_SITE_OTHER): Payer: Medicare Other

## 2022-04-23 VITALS — BP 118/62 | HR 62 | Ht 64.0 in | Wt 258.4 lb

## 2022-04-23 DIAGNOSIS — Z860101 Personal history of adenomatous and serrated colon polyps: Secondary | ICD-10-CM

## 2022-04-23 DIAGNOSIS — K219 Gastro-esophageal reflux disease without esophagitis: Secondary | ICD-10-CM

## 2022-04-23 DIAGNOSIS — I482 Chronic atrial fibrillation, unspecified: Secondary | ICD-10-CM

## 2022-04-23 DIAGNOSIS — R110 Nausea: Secondary | ICD-10-CM | POA: Diagnosis not present

## 2022-04-23 DIAGNOSIS — Z86010 Personal history of colonic polyps: Secondary | ICD-10-CM | POA: Diagnosis not present

## 2022-04-23 DIAGNOSIS — K5904 Chronic idiopathic constipation: Secondary | ICD-10-CM

## 2022-04-23 DIAGNOSIS — Z6841 Body Mass Index (BMI) 40.0 and over, adult: Secondary | ICD-10-CM

## 2022-04-23 LAB — CBC WITH DIFFERENTIAL/PLATELET
Basophils Absolute: 0.1 10*3/uL (ref 0.0–0.1)
Basophils Relative: 1 % (ref 0.0–3.0)
Eosinophils Absolute: 0.2 10*3/uL (ref 0.0–0.7)
Eosinophils Relative: 2.9 % (ref 0.0–5.0)
HCT: 40.6 % (ref 36.0–46.0)
Hemoglobin: 13.3 g/dL (ref 12.0–15.0)
Lymphocytes Relative: 21.2 % (ref 12.0–46.0)
Lymphs Abs: 1.3 10*3/uL (ref 0.7–4.0)
MCHC: 32.7 g/dL (ref 30.0–36.0)
MCV: 80.6 fl (ref 78.0–100.0)
Monocytes Absolute: 0.4 10*3/uL (ref 0.1–1.0)
Monocytes Relative: 6.7 % (ref 3.0–12.0)
Neutro Abs: 4.3 10*3/uL (ref 1.4–7.7)
Neutrophils Relative %: 68.2 % (ref 43.0–77.0)
Platelets: 220 10*3/uL (ref 150.0–400.0)
RBC: 5.04 Mil/uL (ref 3.87–5.11)
RDW: 17.3 % — ABNORMAL HIGH (ref 11.5–15.5)
WBC: 6.3 10*3/uL (ref 4.0–10.5)

## 2022-04-23 LAB — COMPREHENSIVE METABOLIC PANEL
ALT: 20 U/L (ref 0–35)
AST: 12 U/L (ref 0–37)
Albumin: 4.3 g/dL (ref 3.5–5.2)
Alkaline Phosphatase: 82 U/L (ref 39–117)
BUN: 14 mg/dL (ref 6–23)
CO2: 30 mEq/L (ref 19–32)
Calcium: 9.7 mg/dL (ref 8.4–10.5)
Chloride: 103 mEq/L (ref 96–112)
Creatinine, Ser: 0.67 mg/dL (ref 0.40–1.20)
GFR: 88.98 mL/min (ref >60.00)
Glucose, Bld: 87 mg/dL (ref 70–99)
Potassium: 4 mEq/L (ref 3.5–5.1)
Sodium: 141 mEq/L (ref 135–145)
Total Bilirubin: 0.4 mg/dL (ref 0.2–1.2)
Total Protein: 7.2 g/dL (ref 6.0–8.3)

## 2022-04-23 MED ORDER — NA SULFATE-K SULFATE-MG SULF 17.5-3.13-1.6 GM/177ML PO SOLN: 1.0000 | Freq: Once | ORAL | 0 refills | Status: AC

## 2022-04-23 NOTE — Telephone Encounter (Signed)
Faxed cardiac clearance to hold Xarelto for 2 days prior procedure to Dr. Sondra Come.

## 2022-04-23 NOTE — Patient Instructions (Addendum)
Your provider has requested that you go to the basement level for lab work before leaving today. Press "B" on the elevator. The lab is located at the first door on the left as you exit the elevator.   Your provider has requested that you have an abdominal x ray before leaving today. Please go to the basement floor to our Radiology department for the test.   You have been scheduled for an endoscopy and colonoscopy. Please follow the written instructions given to you at your visit today. Please pick up your prep supplies at the pharmacy within the next 1-3 days. If you use inhalers (even only as needed), please bring them with you on the day of your procedure.   We have sent the following medications to your pharmacy for you to pick up at your convenience: [place] will be contaced by our office prior to your procedure for directions on holding your Xarelto.  If you do not hear from our office 1 week prior to your scheduled procedure, please call [phone] to discuss.   Please take your proton pump inhibitor medication, nexium twice a day  Please take this medication 30 minutes to 1 hour before meals- this makes it more effective.  Avoid spicy and acidic foods Avoid fatty foods Limit your intake of coffee, tea, alcohol, and carbonated drinks Work to maintain a healthy weight Keep the head of the bed elevated at least 3 inches with blocks or a wedge pillow if you are having any nighttime symptoms Stay upright for 2 hours after eating Avoid meals and snacks three to four hours before bedtime Can add on pepcid if needed.    Linzess samples 72 mcg start after x ray results *IBS-C patients may begin to experience relief from belly pain and overall abdominal symptoms (pain, discomfort, and bloating) in about 1 week,  with symptoms typically improving over 12 weeks.  Take at least 30 minutes before the first meal of the day on an empty stomach You can have a loose stool if you eat a high-fat  breakfast. Give it at least 7 days, may have more bowel movements during that time.   The diarrhea should go away and you should start having normal, complete, full bowel movements.  It may be helpful to start treatment when you can be near the comfort of your own bathroom, such as a weekend.  After you are out we can send in a prescription if you did well, there is a prescription card  Recommend starting on a fiber supplement, can try metamucil first but if this causes gas/bloating switch to benefiber or citracel, these do not cause gas.  Take with fiber with with a full 8 oz glass of water once a day. This can take 1 month to start helping, so try for at least one month.  Recommend increasing water and physical activity.   - Drink at least 64-80 ounces of water/liquid per day. - Establish a time to try to move your bowels every day.  For many people, this is after a cup of coffee or after a meal such as breakfast. - Sit all of the way back on the toilet keeping your back fairly straight and while sitting up, try to rest the tops of your forearms on your upper thighs.   - Raising your feet with a step stool/squatty potty can be helpful to improve the angle that allows your stool to pass through the rectum. - Relax the rectum feeling it  bulge toward the toilet water.  If you feel your rectum raising toward your body, you are contracting rather than relaxing. - Breathe in and slowly exhale. "Belly breath" by expanding your belly towards your belly button. Keep belly expanded as you gently direct pressure down and back to the anus.  A low pitched GRRR sound can assist with increasing intra-abdominal pressure.  - Repeat 3-4 times. If unsuccessful, contract the pelvic floor to restore normal tone and get off the toilet.  Avoid excessive straining. - To reduce excessive wiping by teaching your anus to normally contract, place hands on outer aspect of knees and resist knee movement outward.  Hold 5-10  second then place hands just inside of knees and resist inward movement of knees.  Hold 5 seconds.  Repeat a few times each way.  Go to the ER if unable to pass gas, severe AB pain, unable to hold down food, any shortness of breath of chest pain.

## 2022-04-24 ENCOUNTER — Encounter: Payer: Self-pay | Admitting: Internal Medicine

## 2022-04-24 NOTE — Telephone Encounter (Signed)
Informed patient to hold Xarelto 2 days prior procedure. Patient verbalized understanding.

## 2022-04-24 NOTE — Telephone Encounter (Signed)
Received cardiac clearance from Dr. Everitt Amber office stating that patient may hold Xarelto 2 days to procedure and resume it 24 hours after procedure. Tried to contact the patient but unable to leave VM. I will try again.

## 2022-04-29 ENCOUNTER — Other Ambulatory Visit: Payer: Self-pay

## 2022-04-29 MED ORDER — ANASTROZOLE 1 MG PO TABS: 1.0000 mg | ORAL_TABLET | Freq: Every day | ORAL | 0 refills | Status: DC

## 2022-05-05 ENCOUNTER — Encounter: Payer: Self-pay | Admitting: Internal Medicine

## 2022-05-05 ENCOUNTER — Other Ambulatory Visit: Payer: Self-pay

## 2022-05-05 MED ORDER — AMOXICILLIN-POT CLAVULANATE 875-125 MG PO TABS: 1.0000 | ORAL_TABLET | Freq: Two times a day (BID) | ORAL | 0 refills | Status: DC

## 2022-05-05 NOTE — Telephone Encounter (Signed)
Okay for empiric diverticulitis treatment with Augmentin 875 mg p.o. twice daily x7 days Soft diet, low residue during flare  She be due surveillance colonoscopy in November, but would wait about 4 weeks after complete resolution of diverticulitis.  She should be seen in the office when she returns home if things or not completely resolved.

## 2022-05-14 ENCOUNTER — Other Ambulatory Visit: Payer: Self-pay

## 2022-05-14 ENCOUNTER — Encounter: Payer: Self-pay | Admitting: Internal Medicine

## 2022-05-14 MED ORDER — AMOXICILLIN-POT CLAVULANATE 875-125 MG PO TABS: 1.0000 | ORAL_TABLET | Freq: Two times a day (BID) | ORAL | 0 refills | Status: DC

## 2022-05-14 NOTE — Telephone Encounter (Signed)
Pyrtle pt sent mychart on 05/05/22 stating she thought she was having a diverticulitis flare. Dr. Hilarie Fredrickson prescribed Augmentin '875mg'$  BID for 7 days. Pt states she is still having some RLQ tenderness and nausea. States she thinks she may need some more days of Augmentin. Dr. Carlean Purl as DOD please advise.

## 2022-05-21 ENCOUNTER — Encounter: Payer: Self-pay | Admitting: Internal Medicine

## 2022-05-26 ENCOUNTER — Encounter: Payer: Self-pay | Admitting: Certified Registered Nurse Anesthetist

## 2022-05-28 ENCOUNTER — Encounter: Payer: Self-pay | Admitting: Internal Medicine

## 2022-06-01 ENCOUNTER — Ambulatory Visit: Payer: Medicare Other | Admitting: Hematology

## 2022-06-01 ENCOUNTER — Other Ambulatory Visit: Payer: Medicare Other

## 2022-06-02 ENCOUNTER — Telehealth: Payer: Self-pay | Admitting: Hematology

## 2022-06-02 ENCOUNTER — Ambulatory Visit (AMBULATORY_SURGERY_CENTER): Payer: Medicare Other | Admitting: Internal Medicine

## 2022-06-02 ENCOUNTER — Encounter: Payer: Self-pay | Admitting: Internal Medicine

## 2022-06-02 VITALS — BP 182/91 | HR 83 | Temp 97.1°F | Resp 12 | Ht 64.0 in | Wt 258.0 lb

## 2022-06-02 DIAGNOSIS — D122 Benign neoplasm of ascending colon: Secondary | ICD-10-CM

## 2022-06-02 DIAGNOSIS — Z86010 Personal history of colonic polyps: Secondary | ICD-10-CM | POA: Diagnosis not present

## 2022-06-02 DIAGNOSIS — K2100 Gastro-esophageal reflux disease with esophagitis, without bleeding: Secondary | ICD-10-CM

## 2022-06-02 DIAGNOSIS — R110 Nausea: Secondary | ICD-10-CM

## 2022-06-02 DIAGNOSIS — K2970 Gastritis, unspecified, without bleeding: Secondary | ICD-10-CM

## 2022-06-02 DIAGNOSIS — Z09 Encounter for follow-up examination after completed treatment for conditions other than malignant neoplasm: Secondary | ICD-10-CM

## 2022-06-02 DIAGNOSIS — K317 Polyp of stomach and duodenum: Secondary | ICD-10-CM

## 2022-06-02 DIAGNOSIS — D124 Benign neoplasm of descending colon: Secondary | ICD-10-CM

## 2022-06-02 DIAGNOSIS — K219 Gastro-esophageal reflux disease without esophagitis: Secondary | ICD-10-CM

## 2022-06-02 DIAGNOSIS — D123 Benign neoplasm of transverse colon: Secondary | ICD-10-CM

## 2022-06-02 DIAGNOSIS — K2950 Unspecified chronic gastritis without bleeding: Secondary | ICD-10-CM | POA: Diagnosis not present

## 2022-06-02 MED ORDER — SODIUM CHLORIDE 0.9 % IV SOLN: 500.0000 mL | Freq: Once | INTRAVENOUS | Status: DC

## 2022-06-02 NOTE — Progress Notes (Signed)
Called to room to assist during endoscopic procedure.  Patient ID and intended procedure confirmed with present staff. Received instructions for my participation in the procedure from the performing physician.  

## 2022-06-02 NOTE — Progress Notes (Signed)
1527 Pt experienced laryngeal spasm with jaw thrust performed. Nasopharyngeal airway size  7.0 placed without trauma, vss

## 2022-06-02 NOTE — Op Note (Signed)
[facility] Patient Name: Lisa Cardenas Procedure Date: 06/02/2022 3:17 PM MRN: [id number] Endoscopist: Jerene Bears , MD, [id number] Age: 71 Referring MD:  Date of Birth: 1951/01/19 Gender: Female Account #: [id number] Procedure:                Upper GI endoscopy Indications:              Gastro-esophageal reflux disease, Nausea Medicines:                Monitored Anesthesia Care Procedure:                Pre-Anesthesia Assessment:                           - Prior to the procedure, a History and Physical                            was performed, and patient medications and                            allergies were reviewed. The patient's tolerance of                            previous anesthesia was also reviewed. The risks                            and benefits of the procedure and the sedation                            options and risks were discussed with the patient.                            All questions were answered, and informed consent                            was obtained. Prior Anticoagulants: The patient has                            taken Xarelto (rivaroxaban), last dose was 2 days                            prior to procedure. ASA Grade Assessment: III - A                            patient with severe systemic disease. After                            reviewing the risks and benefits, the patient was                            deemed in satisfactory condition to undergo the                            procedure.  After obtaining informed consent, the endoscope was                            passed under direct vision. Throughout the                            procedure, the patient's blood pressure, pulse, and                            oxygen saturations were monitored continuously. The                            Endoscope was introduced through the mouth, and                            advanced to the second part of  duodenum. The upper                            GI endoscopy was accomplished without difficulty.                            The patient tolerated the procedure well. Scope In: Scope Out: Findings:                 The examined esophagus was normal.                           A 3 cm hiatal hernia was present.                           The gastroesophageal flap valve was visualized                            endoscopically and classified as Hill Grade III                            (minimal fold, loose to endoscope, hiatal hernia                            likely).                           A single 20 mm pedunculated polyp was found in the                            gastric body. The polyp was removed with a hot                            snare. Resection and retrieval were complete. To                            prevent bleeding after the polypectomy, one                            hemostatic clip was  successfully placed (MR                            conditional). There was no bleeding at the end of                            the maneuver.                           Diffuse mild inflammation characterized by                            congestion (edema) and erythema was found in the                            gastric body and in the gastric antrum. Biopsies                            were taken with a cold forceps for histology and                            Helicobacter pylori testing.                           The examined duodenum was normal. Complications:            No immediate complications. Estimated Blood Loss:     Estimated blood loss was minimal. Impression:               - Normal esophagus.                           - 3 cm hiatal hernia.                           - A single gastric polyp. Resected and retrieved.                            Clip (MR conditional) was placed.                           - Gastritis. Biopsied.                           - Normal examined  duodenum. Recommendation:           - Patient has a contact number available for                            emergencies. The signs and symptoms of potential                            delayed complications were discussed with the                            patient. Return to normal activities tomorrow.  Written discharge instructions were provided to the                            patient.                           - Resume previous diet.                           - Continue present medications.                           - Resume Xarelto (rivaroxaban) at prior dose                            tomorrow. Refer to managing physician for further                            adjustment of therapy.                           - Await pathology results.                           - See the other procedure note for documentation of                            additional recommendations. Jerene Bears, MD 06/02/2022 4:00:39 PM This report has been signed electronically.

## 2022-06-02 NOTE — Telephone Encounter (Signed)
Called patient per 11/17 in basket. Patient notified of new upcoming appointment

## 2022-06-02 NOTE — Progress Notes (Signed)
Report given to PACU, vss 

## 2022-06-02 NOTE — Progress Notes (Signed)
No changes in patient's medical or surgical history since her Office Visit. She stopped her Xarelto on 05/30/22

## 2022-06-02 NOTE — Patient Instructions (Signed)
Discharge instructions given. Handouts on polyps,diverticulosis and hemorrhoids,hiatal hernia. Resume previous medications. Resume Xarelto tomorrow. YOU HAD AN ENDOSCOPIC PROCEDURE TODAY AT [facility]:   Refer to the procedure report that was given to you for any specific questions about what was found during the examination.  If the procedure report does not answer your questions, please call your gastroenterologist to clarify.  If you requested that your care partner not be given the details of your procedure findings, then the procedure report has been included in a sealed envelope for you to review at your convenience later.  YOU SHOULD EXPECT: Some feelings of bloating in the abdomen. Passage of more gas than usual.  Walking can help get rid of the air that was put into your GI tract during the procedure and reduce the bloating. If you had a lower endoscopy (such as a colonoscopy or flexible sigmoidoscopy) you may notice spotting of blood in your stool or on the toilet paper. If you underwent a bowel prep for your procedure, you may not have a normal bowel movement for a few days.  Please Note:  You might notice some irritation and congestion in your nose or some drainage.  This is from the oxygen used during your procedure.  There is no need for concern and it should clear up in a day or so.  SYMPTOMS TO REPORT IMMEDIATELY:  Following lower endoscopy (colonoscopy or flexible sigmoidoscopy):  Excessive amounts of blood in the stool  Significant tenderness or worsening of abdominal pains  Swelling of the abdomen that is new, acute  Fever of 100F or higher  Following upper endoscopy (EGD)  Vomiting of blood or coffee ground material  New chest pain or pain under the shoulder blades  Painful or persistently difficult swallowing  New shortness of breath  Fever of 100F or higher  Black, tarry-looking stools  For urgent or emergent issues, a gastroenterologist can be  reached at any hour by calling [phone]. Do not use MyChart messaging for urgent concerns.    DIET:  We do recommend a small meal at first, but then you may proceed to your regular diet.  Drink plenty of fluids but you should avoid alcoholic beverages for 24 hours.  ACTIVITY:  You should plan to take it easy for the rest of today and you should NOT DRIVE or use heavy machinery until tomorrow (because of the sedation medicines used during the test).    FOLLOW UP: Our staff will call the number listed on your records the next business day following your procedure.  We will call around 7:15- 8:00 am to check on you and address any questions or concerns that you may have regarding the information given to you following your procedure. If we do not reach you, we will leave a message.     If any biopsies were taken you will be contacted by phone or by letter within the next 1-3 weeks.  Please call us at [phone] if you have not heard about the biopsies in 3 weeks.    SIGNATURES/CONFIDENTIALITY: You and/or your care partner have signed paperwork which will be entered into your electronic medical record.  These signatures attest to the fact that that the information above on your After Visit Summary has been reviewed and is understood.  Full responsibility of the confidentiality of this discharge information lies with you and/or your care-partner.

## 2022-06-02 NOTE — Op Note (Signed)
[place] Patient Name: Lisa Cardenas Procedure Date: 06/02/2022 3:16 PM MRN: [id number] Endoscopist: Jerene Bears , MD, [id number] Age: 71 Referring MD:  Date of Birth: Dec 03, 1950 Gender: Female Account #: [id number] Procedure:                Colonoscopy Indications:              High risk colon cancer surveillance: Personal                            history of multiple adenomas + SSPs, Last                            colonoscopy: November 2020 (8 polyps removed) Medicines:                Monitored Anesthesia Care Procedure:                Pre-Anesthesia Assessment:                           - Prior to the procedure, a History and Physical                            was performed, and patient medications and                            allergies were reviewed. The patient's tolerance of                            previous anesthesia was also reviewed. The risks                            and benefits of the procedure and the sedation                            options and risks were discussed with the patient.                            All questions were answered, and informed consent                            was obtained. Prior Anticoagulants: The patient has                            taken Xarelto (rivaroxaban), last dose was 2 days                            prior to procedure. ASA Grade Assessment: III - A                            patient with severe systemic disease. After                            reviewing the risks and benefits, the patient was  deemed in satisfactory condition to undergo the                            procedure.                           After obtaining informed consent, the colonoscope                            was passed under direct vision. Throughout the                            procedure, the patient's blood pressure, pulse, and                            oxygen saturations were monitored continuously.  The                            Olympus CF-HQ190L [id number]) Colonoscope was                            introduced through the anus and advanced to the                            cecum, identified by appendiceal orifice and                            ileocecal valve. The colonoscopy was performed                            without difficulty. The patient tolerated the                            procedure well. The quality of the bowel                            preparation was good. The ileocecal valve,                            appendiceal orifice, and rectum were photographed. Scope In: 3:38:07 PM Scope Out: 3:56:30 PM Scope Withdrawal Time: 0 hours 11 minutes 41 seconds  Total Procedure Duration: 0 hours 18 minutes 23 seconds  Findings:                 The digital rectal exam was normal.                           A 4 mm polyp was found in the ascending colon. The                            polyp was sessile. The polyp was removed with a                            cold snare. Resection and retrieval were complete.  A 4 mm polyp was found in the transverse colon. The                            polyp was sessile. The polyp was removed with a                            cold snare. Resection and retrieval were complete.                           Two sessile polyps were found in the descending                            colon. The polyps were 4 to 5 mm in size. These                            polyps were removed with a cold snare. Resection                            and retrieval were complete.                           Multiple large-mouthed and small-mouthed                            diverticula were found in the sigmoid colon.                           Internal hemorrhoids were found during                            retroflexion. The hemorrhoids were small. Complications:            No immediate complications. Estimated Blood Loss:     Estimated blood loss:  none. Impression:               - One 4 mm polyp in the ascending colon, removed                            with a cold snare. Resected and retrieved.                           - One 4 mm polyp in the transverse colon, removed                            with a cold snare. Resected and retrieved.                           - Two 4 to 5 mm polyps in the descending colon,                            removed with a cold snare. Resected and retrieved.                           -  Moderate diverticulosis in the sigmoid colon.                           - Internal hemorrhoids. Recommendation:           - Patient has a contact number available for                            emergencies. The signs and symptoms of potential                            delayed complications were discussed with the                            patient. Return to normal activities tomorrow.                            Written discharge instructions were provided to the                            patient.                           - Resume previous diet.                           - Continue present medications. Ok to try Linzess                            72 mcg daily for constipation. If not effective or                            side effects please let me know.                           - Resume Xarelto (rivaroxaban) at prior dose                            tomorrow. Refer to managing physician for further                            adjustment of therapy.                           - Await pathology results.                           - Repeat colonoscopy is recommended for                            surveillance. The colonoscopy date will be                            determined after pathology results from today's  exam become available for review. Jerene Bears, MD 06/02/2022 4:04:17 PM This report has been signed electronically.

## 2022-06-02 NOTE — Progress Notes (Signed)
GASTROENTEROLOGY PROCEDURE H&P NOTE   Primary Care Physician: Moshe Cipro, MD    Reason for Procedure:  Nausea, GERD and history of colon polyps  Plan:    EGD and colonoscopy  Patient is appropriate for endoscopic procedure(s) in the ambulatory ([place]) setting.  The nature of the procedure, as well as the risks, benefits, and alternatives were carefully and thoroughly reviewed with the patient. Ample time for discussion and questions allowed. The patient understood, was satisfied, and agreed to proceed.     HPI: Lisa Cardenas is a 71 y.o. female who presents for EGD and colonoscopy.  Medical history as below.  Tolerated the prep.  No recent chest pain or shortness of breath.  No abdominal pain today.  Xarelto on hold x48 hours.  Past Medical History:  Diagnosis Date   A-fib [facility])    no issues since May 2020   Arrhythmia    Cancer ([place])    Diverticulosis    Elevated cholesterol    Family history of breast cancer    Family history of melanoma    Family history of pancreatic cancer    Family history of prostate cancer    Hypertension    Obesity    Pancreatitis    Thyroid disease    Trigeminal neuralgia    Trigeminal neuralgia    UTI (urinary tract infection)     Past Surgical History:  Procedure Laterality Date   ABDOMINAL HYSTERECTOMY  1980   BLADDER REPAIR     BREAST BIOPSY Left 11/2019   BREAST LUMPECTOMY Left 11/23/2019   BREAST LUMPECTOMY WITH RADIOACTIVE SEED AND SENTINEL LYMPH NODE BIOPSY Left 11/23/2019   Procedure: LEFT BREAST LUMPECTOMY WITH RADIOACTIVE SEED AND LEFT AXILLARY SENTINEL LYMPH NODE BIOPSY;  Surgeon: Rolm Bookbinder, MD;  Location: [facility];  Service: General;  Laterality: Left;  PEC BLOCK   [facility]   KN[facility] ARTHROSCOPY Left    TONSILLECTOMY      Prior to Admission medications   Medication Sig Start Date End Date Taking? Authorizing Provider  amLODipine  (NORVASC) 5 MG tablet Take 5 mg by mouth daily.   Yes [provider]  celecoxib (CELEBREX) 200 MG capsule Take 200 mg by mouth every 30 (thirty) days. 10/09/19  Yes [provider]  esomeprazole (NEXIUM) 40 MG capsule Take 40 mg by mouth 2 (two) times daily as needed.   Yes [provider]  levothyroxine (SYNTHROID) 137 MCG tablet Take 137 mcg by mouth daily. Current dose 137 mcg daily   Yes [provider]  olmesartan (BENICAR) 20 MG tablet Take 20 mg by mouth daily.   Yes [provider]  pregabalin (LYRICA) 50 MG capsule Take 50 mg by mouth 2 (two) times daily.   Yes [provider]  primidone (MYSOLINE) 50 MG tablet Take 50 mg by mouth daily.    Yes [provider]  rivaroxaban (XARELTO) 20 MG TABS tablet Take 20 mg by mouth daily.   Yes [provider]  rosuvastatin (CRESTOR) 20 MG tablet Take 20 mg by mouth daily.   Yes [provider]  sertraline (ZOLOFT) 50 MG tablet Take 50 mg by mouth daily.   Yes [provider]  sotalol (BETAPACE) 80 MG tablet Take 80 mg by mouth 2 (two) times daily. 03/06/22  Yes [provider]  Vitamin D, Ergocalciferol, (DRISDOL) 1.25 MG (50000 UNIT) CAPS capsule Take 50,000 Units by mouth  once a week. 09/06/19  Yes [provider]  amoxicillin-clavulanate (AUGMENTIN) 875-125 MG tablet Take 1 tablet by mouth 2 (two) times daily. 05/05/22   Meshawn Oconnor, Lajuan Lines, MD  amoxicillin-clavulanate (AUGMENTIN) 875-125 MG tablet Take 1 tablet by mouth 2 (two) times daily. 05/14/22   Gatha Mayer, MD  anastrozole (ARIMIDEX) 1 MG tablet Take 1 tablet (1 mg total) by mouth daily. 04/29/22   Truitt Merle, MD  LORazepam (ATIVAN) 0.5 MG tablet Take 0.5 mg by mouth as needed for anxiety.    [provider]  SUMAtriptan Succinate 3 MG/0.5ML SOAJ Inject 3 mg into the skin once for 1 dose. 04/18/20 04/18/20  Ventura Sellers, MD  triamcinolone lotion (KENALOG) 0.1 % Apply 1  application topically 3 (three) times daily. Use for 2 weeks prn then stop for 1 week before restarting. Patient taking differently: Apply 1 application  topically 3 (three) times daily. Use for 2 weeks prn then stop for 1 week before restarting. As needed 05/21/20   Harle Stanford., PA-C    Current Outpatient Medications  Medication Sig Dispense Refill   amLODipine (NORVASC) 5 MG tablet Take 5 mg by mouth daily.     celecoxib (CELEBREX) 200 MG capsule Take 200 mg by mouth every 30 (thirty) days.     esomeprazole (NEXIUM) 40 MG capsule Take 40 mg by mouth 2 (two) times daily as needed.     levothyroxine (SYNTHROID) 137 MCG tablet Take 137 mcg by mouth daily. Current dose 137 mcg daily     olmesartan (BENICAR) 20 MG tablet Take 20 mg by mouth daily.     pregabalin (LYRICA) 50 MG capsule Take 50 mg by mouth 2 (two) times daily.     primidone (MYSOLINE) 50 MG tablet Take 50 mg by mouth daily.      rivaroxaban (XARELTO) 20 MG TABS tablet Take 20 mg by mouth daily.     rosuvastatin (CRESTOR) 20 MG tablet Take 20 mg by mouth daily.     sertraline (ZOLOFT) 50 MG tablet Take 50 mg by mouth daily.     sotalol (BETAPACE) 80 MG tablet Take 80 mg by mouth 2 (two) times daily.     Vitamin D, Ergocalciferol, (DRISDOL) 1.25 MG (50000 UNIT) CAPS capsule Take 50,000 Units by mouth once a week.     amoxicillin-clavulanate (AUGMENTIN) 875-125 MG tablet Take 1 tablet by mouth 2 (two) times daily. 14 tablet 0   amoxicillin-clavulanate (AUGMENTIN) 875-125 MG tablet Take 1 tablet by mouth 2 (two) times daily. 10 tablet 0   anastrozole (ARIMIDEX) 1 MG tablet Take 1 tablet (1 mg total) by mouth daily. 90 tablet 0   LORazepam (ATIVAN) 0.5 MG tablet Take 0.5 mg by mouth as needed for anxiety.     SUMAtriptan Succinate 3 MG/0.5ML SOAJ Inject 3 mg into the skin once for 1 dose. 0.5 mL 3   triamcinolone lotion (KENALOG) 0.1 % Apply 1 application topically 3 (three) times daily. Use for 2 weeks prn then stop for 1 week before  restarting. (Patient taking differently: Apply 1 application  topically 3 (three) times daily. Use for 2 weeks prn then stop for 1 week before restarting. As needed) 180 mL 0   Current Facility-Administered Medications  Medication Dose Route Frequency Provider Last Rate Last Admin   0.9 %  sodium chloride infusion  500 mL Intravenous Once Tannya Gonet, Lajuan Lines, MD        Allergies as of 06/02/2022 - Review Complete 06/02/2022  Allergen Reaction Noted  Ciprofloxacin Other (See Comments) 04/23/2022   Plaquenil [hydroxychloroquine]  10/26/2019    Family History  Problem Relation Age of Onset   Colon cancer Mother        dx 27   Breast cancer Maternal Grandmother    Breast cancer Maternal Aunt    Prostate cancer Maternal Uncle    Breast cancer Paternal Aunt    Melanoma Paternal Uncle    Breast cancer Maternal Aunt    Leukemia Maternal Uncle    Breast cancer Paternal Aunt    Colon cancer Paternal Aunt    Melanoma Daughter     Social History   Socioeconomic History   Marital status: Married    Spouse name: Not on file   Number of children: 3   Years of education: Not on file   Highest education level: Not on file  Occupational History   Not on file  Tobacco Use   Smoking status: Never   Smokeless tobacco: Never  Vaping Use   Vaping Use: Never used  Substance and Sexual Activity   Alcohol use: Not Currently   Drug use: Not Currently   Sexual activity: Not on file  Other Topics Concern   Not on file  Social History Narrative   Not on file   Social Determinants of Health   Financial Resource Strain: Not on file  Food Insecurity: Not on file  Transportation Needs: Not on file  Physical Activity: Not on file  Stress: Not on file  Social Connections: Not on file  Intimate Partner Violence: Not on file    Physical Exam: Vital signs in last 24 hours: '@BP'$  (!) 189/96   Pulse 64   Temp (!) 97.1 F (36.2 C)   Resp 20   Ht '5\' 4"'$  (1.626 m)   Wt 258 lb (117 kg)   SpO2 98%    BMI 44.29 kg/m  GEN: NAD EYE: Sclerae anicteric ENT: MMM CV: Non-tachycardic Pulm: CTA b/l GI: Soft, NT/ND NEURO:  Alert & Oriented x 3   Zenovia Jarred, MD [facility]  06/02/2022 3:22 PM

## 2022-06-03 ENCOUNTER — Telehealth: Payer: Self-pay

## 2022-06-03 NOTE — Telephone Encounter (Signed)
  Follow up Call-     06/02/2022    2:51 PM  Call back number  Post procedure Call Back phone  # [phone]  Permission to leave phone message Yes     Patient questions:  Do you have a fever, pain , or abdominal swelling? No. Pain Score  0 *  Have you tolerated food without any problems? Yes.    Have you been able to return to your normal activities? Yes.    Do you have any questions about your discharge instructions: Diet   No. Medications  No. Follow up visit  No.  Do you have questions or concerns about your Care? No.  Actions: * If pain score is 4 or above: No action needed, pain <4.

## 2022-06-12 ENCOUNTER — Encounter: Payer: Self-pay | Admitting: Internal Medicine

## 2022-06-12 ENCOUNTER — Other Ambulatory Visit: Payer: Self-pay | Admitting: Hematology

## 2022-06-12 DIAGNOSIS — Z853 Personal history of malignant neoplasm of breast: Secondary | ICD-10-CM

## 2022-06-24 ENCOUNTER — Other Ambulatory Visit: Payer: Self-pay | Admitting: Hematology

## 2022-06-24 ENCOUNTER — Ambulatory Visit
Admission: RE | Admit: 2022-06-24 | Discharge: 2022-06-24 | Disposition: A | Discharge status: Home / Self Care | Payer: Medicare Other | Source: Ambulatory Visit | Attending: Hematology | Admitting: Hematology

## 2022-06-24 DIAGNOSIS — Z853 Personal history of malignant neoplasm of breast: Secondary | ICD-10-CM

## 2022-07-07 ENCOUNTER — Ambulatory Visit: Payer: Medicare Other | Admitting: Internal Medicine

## 2022-07-31 LAB — LAB REPORT - SCANNED: EGFR: 76

## 2022-08-03 ENCOUNTER — Ambulatory Visit: Payer: Medicare Other | Admitting: Hematology

## 2022-08-03 ENCOUNTER — Other Ambulatory Visit: Payer: Medicare Other

## 2022-08-04 ENCOUNTER — Other Ambulatory Visit: Payer: Self-pay

## 2022-08-04 DIAGNOSIS — C50412 Malignant neoplasm of upper-outer quadrant of left female breast: Secondary | ICD-10-CM

## 2022-08-04 NOTE — Assessment & Plan Note (Signed)
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

## 2022-08-04 NOTE — Progress Notes (Unsigned)
[place]   [phone] [phone]   Clinic Follow up Note   Patient Care Team: Moshe Cipro, MD as PCP - General (Internal Medicine) Rolm Bookbinder, MD as Consulting Physician (General Surgery) Pyrtle, Lajuan Lines, MD as Consulting Physician (Gastroenterology) Rockwell Germany, RN as Oncology Nurse Navigator Mauro Kaufmann, RN as Oncology Nurse Navigator Zagol, Barnie Alderman, MD as Referring Physician (Cardiology) Malon Kindle, MD as Referring Physician (Neurology) Sarina Ill, MD as Referring Physician (Neurosurgery) Truitt Merle, MD as Attending Physician (Hematology and Oncology)  Date of Service:  08/05/2022  CHIEF COMPLAINT: f/u of Estrogen receptor positive lobular breast cancer   CURRENT THERAPY:  Anastrozole   ASSESSMENT:  Lisa Cardenas is a 72 y.o. female with   Malignant neoplasm of upper-outer quadrant of left breast in female, estrogen receptor positive ([place]) -Diagnosed in 09/2019, Oncotype RS 18  -status post left lumpectomy and sentinel lymph node sampling 11/23/2019 for a pT1b pN0, stage IA invasive lobular carcinoma, with negative margins  -Completes adjuvant radiation -on adjuvant anstrozole since 02/2020, she tolerated moderately well, with hot flashes and night and insomnia.  We discussed gabapentin and night for her, previous to get it, and does not want for now. -She is clinically doing well, lab reviewed, exam was unremarkable, no clinical concern for recurrence -Last mammogram in December 2023 showed microcalcification in left breast, a follow-up with mammogram is scheduled for June 2024 -Previous bone density scan was normal in 2022, will repeat in June 2024     PLAN: -lab reviewed  -I recommend Melatonin for sleep -I refill Anastrozole -L Mammogram / DEXA f/u in 12/25/2022, bilateral screening mammogram in December 2024 -F/U  with NP in 6 months and see me back in one year    SUMMARY OF  ONCOLOGIC HISTORY: Oncology History  Malignant neoplasm of upper-outer quadrant of left breast in female, estrogen receptor positive ([place])  10/26/2019 Initial Diagnosis   Malignant neoplasm of upper-outer quadrant of left breast in female, estrogen receptor positive ([place])   11/01/2019 Cancer Staging   Staging form: Breast, AJCC 8th Edition - Clinical: Stage IA (cT1a, cN0, cM0, G2, ER+, PR+, HER2-) - Signed by Gardenia Phlegm, NP on 11/01/2019   11/03/2019 Genetic Testing   Negative genetic testing. No pathogenic variants identified on the [facility] Breast Cancer STAT Panel + Common Hereditary Cancers Panel. The report date is 11/03/2019.  The STAT Breast cancer panel offered by [facility] includes sequencing and rearrangement analysis for the following 9 genes:  ATM, BRCA1, BRCA2, CDH1, CHEK2, PALB2, PTEN, STK11 and TP53.    The Common Hereditary Cancers Panel offered by [facility] includes sequencing and/or deletion duplication testing of the following 48 genes: APC, ATM, AXIN2, BARD1, BMPR1A, BRCA1, BRCA2, BRIP1, CDH1, CDKN2A (p14ARF), CDKN2A (p16INK4a), CKD4, CHEK2, CTNNA1, DICER1, EPCAM (Deletion/duplication testing only), GREM1 (promoter region deletion/duplication testing only), KIT, MEN1, MLH1, MSH2, MSH3, MSH6, MUTYH, NBN, NF1, NHTL1, PALB2, PDGFRA, PMS2, POLD1, POLE, PTEN, RAD50, RAD51C, RAD51D, RNF43, SDHB, SDHC, SDHD, SMAD4, SMARCA4. STK11, TP53, TSC1, TSC2, and VHL.  The following genes were evaluated for sequence changes only: SDHA and HOXB13 c.251G>A variant only.   11/23/2019 Cancer Staging   Staging form: Breast, AJCC 8th Edition - Pathologic stage from 11/23/2019: Stage IA (pT1b, pN0, cM0, G2, ER+, PR+, HER2-) - Signed by Gardenia Phlegm, NP on 12/13/2019      INTERVAL HISTORY:  Lisa Cardenas is here for a follow up of  Estrogen receptor positive lobular breast cancer  She was last seen by Dr.Magrinat on 05/19/2021 She presents to the clinic alone. Pt states she feels  really tired today. Pt denies having pain, SOB. Pt is still taking Anastrozole, she deals with the hot flashes and it impacts her sleeps.    All other systems were reviewed with the patient and are negative.  MEDICAL HISTORY:  Past Medical History:  Diagnosis Date   A-fib [facility])    no issues since May 2020   Arrhythmia    Cancer ([place])    Diverticulosis    Elevated cholesterol    Family history of breast cancer    Family history of melanoma    Family history of pancreatic cancer    Family history of prostate cancer    Hypertension    Obesity    Pancreatitis    Thyroid disease    Trigeminal neuralgia    Trigeminal neuralgia    UTI (urinary tract infection)     SURGICAL HISTORY: Past Surgical History:  Procedure Laterality Date   ABDOMINAL HYSTERECTOMY  1980   BLADDER REPAIR     BREAST BIOPSY Left 11/2019   BREAST LUMPECTOMY Left 11/23/2019   BREAST LUMPECTOMY WITH RADIOACTIVE SEED AND SENTINEL LYMPH NODE BIOPSY Left 11/23/2019   Procedure: LEFT BREAST LUMPECTOMY WITH RADIOACTIVE SEED AND LEFT AXILLARY SENTINEL LYMPH NODE BIOPSY;  Surgeon: Rolm Bookbinder, MD;  Location: [facility];  Service: General;  Laterality: Left;  PEC BLOCK   COLONOSCOPY     [facility]   [facility]OP[facility]AGOGASTRODUODENOSCOPY     [facility]   KNEE ARTHROSCOPY Left    TONSILLECTOMY      I have reviewed the social history and family history with the patient and they are unchanged from previous note.  ALLERGIES:  is allergic to ciprofloxacin and plaquenil [hydroxychloroquine].  MEDICATIONS:  Current Outpatient Medications  Medication Sig Dispense Refill   amLODipine (NORVASC) 5 MG tablet Take 5 mg by mouth daily.     anastrozole (ARIMIDEX) 1 MG tablet Take 1 tablet (1 mg total) by mouth daily. 90 tablet 3   celecoxib (CELEBREX) 200 MG capsule Take 200 mg by mouth every 30 (thirty) days.     esomeprazole (NEXIUM) 40 MG capsule Take 40 mg by mouth 2 (two) times daily as needed.     levothyroxine  (SYNTHROID) 137 MCG tablet Take 137 mcg by mouth daily. Current dose 137 mcg daily     LORazepam (ATIVAN) 0.5 MG tablet Take 0.5 mg by mouth as needed for anxiety.     olmesartan (BENICAR) 20 MG tablet Take 20 mg by mouth daily.     pregabalin (LYRICA) 50 MG capsule Take 50 mg by mouth 2 (two) times daily.     primidone (MYSOLINE) 50 MG tablet Take 50 mg by mouth daily.      rivaroxaban (XARELTO) 20 MG TABS tablet Take 20 mg by mouth daily.     rosuvastatin (CRESTOR) 20 MG tablet Take 20 mg by mouth daily.     sertraline (ZOLOFT) 50 MG tablet Take 50 mg by mouth daily.     sotalol (BETAPACE) 80 MG tablet Take 80 mg by mouth 2 (two) times daily.     Vitamin D, Ergocalciferol, (DRISDOL) 1.25 MG (50000 UNIT) CAPS capsule Take 50,000 Units by mouth once a week.     No current facility-administered medications for this visit.    PHYSICAL EXAMINATION: ECOG PERFORMANCE STATUS: 0 - Asymptomatic  Vitals:   08/05/22 1118  BP: (!) 141/69  Pulse: (!) 57  Resp:  18  Temp: 97.8 F (36.6 C)  SpO2: 98%   Wt Readings from Last 3 Encounters:  08/05/22 258 lb 11 oz (117.3 kg)  06/02/22 258 lb (117 kg)  04/23/22 258 lb 6 oz (117.2 kg)      GENERAL:alert, no distress and comfortable SKIN: skin color normal, no rashes or significant lesions EYES: normal, Conjunctiva are pink and non-injected, sclera clear  NEURO: alert & oriented x 3 with fluent speech NECK: (-) supple, thyroid normal size, non-tender, without nodularity LYMPH: (-)  no palpable lymphadenopathy in the cervical, axillary  LUNGS: (-)  clear to auscultation and percussion with normal breathing effort HEART: (-) regular rate & rhythm and no murmurs and no lower extremity edema ABDOMEN:(-) abdomen soft, non-tender and normal bowel sounds BREAST:  Incision about the left nipple and the left arm pit.No palpable mass. Breast exam benign      LABORATORY DATA:  I have reviewed the data as listed    Latest Ref Rng & Units 08/05/2022    10:44 AM 04/23/2022    2:44 PM 05/19/2021   11:21 AM  CBC  WBC 4.0 - 10.5 K/uL 5.9  6.3  5.7   Hemoglobin 12.0 - 15.0 g/dL 13.4  13.3  13.7   Hematocrit 36.0 - 46.0 % 40.8  40.6  42.5   Platelets 150 - 400 K/uL 212  220.0  202         Latest Ref Rng & Units 08/05/2022   10:44 AM 04/23/2022    2:44 PM 05/19/2021   11:21 AM  CMP  Glucose 70 - 99 mg/dL 116  87  108   BUN 8 - 23 mg/dL '17  14  17   '$ Creatinine 0.44 - 1.00 mg/dL 0.75  0.67  0.75   Sodium 135 - 145 mmol/L 141  141  140   Potassium 3.5 - 5.1 mmol/L 3.9  4.0  4.1   Chloride 98 - 111 mmol/L 104  103  104   CO2 22 - 32 mmol/L 32  30  28   Calcium 8.9 - 10.3 mg/dL 9.9  9.7  9.5   Total Protein 6.5 - 8.1 g/dL 7.1  7.2  6.9   Total Bilirubin 0.3 - 1.2 mg/dL 0.6  0.4  0.6   Alkaline Phos 38 - 126 U/L 91  82  87   AST 15 - 41 U/L '10  12  16   '$ ALT 0 - 44 U/L 17  20  32       RADIOGRAPHIC STUDIES: I have personally reviewed the radiological images as listed and agreed with the findings in the report. No results found.    Orders Placed This Encounter  Procedures   MM Digital Screening    Standing Status:   Future    Standing Expiration Date:   08/05/2023    Order Specific Question:   Reason for Exam (SYMPTOM  OR DIAGNOSIS REQUIRED)    Answer:   screening    Order Specific Question:   Preferred imaging location?    Answer:   [facility]   DG Bone Density    Standing Status:   Future    Standing Expiration Date:   08/05/2023    Order Specific Question:   Reason for Exam (SYMPTOM  OR DIAGNOSIS REQUIRED)    Answer:   screening    Order Specific Question:   Preferred imaging location?    Answer:   [facility]   All questions were answered. The patient  knows to call the clinic with any problems, questions or concerns. No barriers to learning was detected. The total time spent in the appointment was 30 minutes.     Truitt Merle, MD 08/05/2022   Felicity Coyer, CMA, am acting as scribe for Truitt Merle, MD.    I have reviewed the above documentation for accuracy and completeness, and I agree with the above.

## 2022-08-05 ENCOUNTER — Inpatient Hospital Stay (HOSPITAL_BASED_OUTPATIENT_CLINIC_OR_DEPARTMENT_OTHER): Payer: Medicare Other | Admitting: Hematology

## 2022-08-05 ENCOUNTER — Encounter: Payer: Self-pay | Admitting: Hematology

## 2022-08-05 ENCOUNTER — Inpatient Hospital Stay: Payer: Medicare Other | Attending: Oncology

## 2022-08-05 VITALS — BP 141/69 | HR 57 | Temp 97.8°F | Resp 18 | Ht 64.0 in | Wt 258.7 lb

## 2022-08-05 DIAGNOSIS — Z79811 Long term (current) use of aromatase inhibitors: Secondary | ICD-10-CM | POA: Insufficient documentation

## 2022-08-05 DIAGNOSIS — Z1231 Encounter for screening mammogram for malignant neoplasm of breast: Secondary | ICD-10-CM

## 2022-08-05 DIAGNOSIS — E2839 Other primary ovarian failure: Secondary | ICD-10-CM | POA: Diagnosis not present

## 2022-08-05 DIAGNOSIS — C50412 Malignant neoplasm of upper-outer quadrant of left female breast: Secondary | ICD-10-CM | POA: Diagnosis present

## 2022-08-05 DIAGNOSIS — Z170 Estrogen receptor positive status [ER+]: Secondary | ICD-10-CM

## 2022-08-05 DIAGNOSIS — Z800 Family history of malignant neoplasm of digestive organs: Secondary | ICD-10-CM | POA: Insufficient documentation

## 2022-08-05 DIAGNOSIS — Z803 Family history of malignant neoplasm of breast: Secondary | ICD-10-CM | POA: Diagnosis not present

## 2022-08-05 DIAGNOSIS — Z808 Family history of malignant neoplasm of other organs or systems: Secondary | ICD-10-CM | POA: Insufficient documentation

## 2022-08-05 LAB — CBC WITH DIFFERENTIAL (CANCER CENTER ONLY)
Abs Immature Granulocytes: 0.02 10*3/uL (ref 0.00–0.07)
Basophils Absolute: 0 10*3/uL (ref 0.0–0.1)
Basophils Relative: 1 %
Eosinophils Absolute: 0.2 10*3/uL (ref 0.0–0.5)
Eosinophils Relative: 3 %
HCT: 40.8 % (ref 36.0–46.0)
Hemoglobin: 13.4 g/dL (ref 12.0–15.0)
Immature Granulocytes: 0 %
Lymphocytes Relative: 18 %
Lymphs Abs: 1.1 10*3/uL (ref 0.7–4.0)
MCH: 26.5 pg (ref 26.0–34.0)
MCHC: 32.8 g/dL (ref 30.0–36.0)
MCV: 80.8 fL (ref 80.0–100.0)
Monocytes Absolute: 0.3 10*3/uL (ref 0.1–1.0)
Monocytes Relative: 4 %
Neutro Abs: 4.3 10*3/uL (ref 1.7–7.7)
Neutrophils Relative %: 74 %
Platelet Count: 212 10*3/uL (ref 150–400)
RBC: 5.05 MIL/uL (ref 3.87–5.11)
RDW: 15 % (ref 11.5–15.5)
WBC Count: 5.9 10*3/uL (ref 4.0–10.5)
nRBC: 0 % (ref 0.0–0.2)

## 2022-08-05 LAB — CMP (CANCER CENTER ONLY)
ALT: 17 U/L (ref 0–44)
AST: 10 U/L — ABNORMAL LOW (ref 15–41)
Albumin: 4.1 g/dL (ref 3.5–5.0)
Alkaline Phosphatase: 91 U/L (ref 38–126)
Anion gap: 5 (ref 5–15)
BUN: 17 mg/dL (ref 8–23)
CO2: 32 mmol/L (ref 22–32)
Calcium: 9.9 mg/dL (ref 8.9–10.3)
Chloride: 104 mmol/L (ref 98–111)
Creatinine: 0.75 mg/dL (ref 0.44–1.00)
GFR, Estimated: >60 mL/min (ref >60)
Glucose, Bld: 116 mg/dL — ABNORMAL HIGH (ref 70–99)
Potassium: 3.9 mmol/L (ref 3.5–5.1)
Sodium: 141 mmol/L (ref 135–145)
Total Bilirubin: 0.6 mg/dL (ref 0.3–1.2)
Total Protein: 7.1 g/dL (ref 6.5–8.1)

## 2022-08-05 MED ORDER — ANASTROZOLE 1 MG PO TABS: 1.0000 mg | ORAL_TABLET | Freq: Every day | ORAL | 3 refills | Status: DC

## 2022-08-24 LAB — LAB REPORT - SCANNED: EGFR: 62

## 2022-09-07 LAB — LAB REPORT - SCANNED: EGFR: 94

## 2022-09-10 ENCOUNTER — Telehealth: Payer: Self-pay | Admitting: Cardiology

## 2022-09-10 NOTE — Telephone Encounter (Signed)
Pt states that Dr. Percival Spanish told her at husbands last visit that he would see her as a NP and does not require a referral. She would like a callback regarding this matter.

## 2022-09-11 NOTE — Telephone Encounter (Signed)
Spoke with pt, Follow up scheduled  

## 2022-09-30 ENCOUNTER — Encounter: Payer: Self-pay | Admitting: Cardiology

## 2022-09-30 NOTE — Progress Notes (Unsigned)
Cardiology Office Note   Date:  09/30/2022   ID:  Winner, [place] [birth year], MRN [id number]  PCP:  Moshe Cipro, MD  Cardiologist:   None Referring:  ***  No chief complaint on file.     History of Present Illness: Lisa Cardenas is a 72 y.o. female who presents for ***     Past Medical History:  Diagnosis Date   A-fib [facility])    no issues since May 2020   Cancer [facility])    Diverticulosis    Elevated cholesterol    Hypertension    Obesity    Pancreatitis    Thyroid disease    Trigeminal neuralgia     Past Surgical History:  Procedure Laterality Date   ABDOMINAL HYSTERECTOMY  1980   BLADDER REPAIR     BREAST BIOPSY Left 11/2019   BREAST LUMPECTOMY Left 11/23/2019   BREAST LUMPECTOMY WITH RADIOACTIVE SEED AND SENTINEL LYMPH NODE BIOPSY Left 11/23/2019   Procedure: LEFT BREAST LUMPECTOMY WITH RADIOACTIVE SEED AND LEFT AXILLARY SENTINEL LYMPH NODE BIOPSY;  Surgeon: Rolm Bookbinder, MD;  Location: [place];  Service: General;  Laterality: Left;  PEC BLOCK   COLONOSCOPY     [facility]   ESOPHAGOGASTRODUODENOSCOPY     [facility]   KNEE ARTHROSCOPY Left    TONSILLECTOMY       Current Outpatient Medications  Medication Sig Dispense Refill   amLODipine (NORVASC) 5 MG tablet Take 5 mg by mouth daily.     anastrozole (ARIMIDEX) 1 MG tablet Take 1 tablet (1 mg total) by mouth daily. 90 tablet 3   celecoxib (CELEBREX) 200 MG capsule Take 200 mg by mouth every 30 (thirty) days.     esomeprazole (NEXIUM) 40 MG capsule Take 40 mg by mouth 2 (two) times daily as needed.     levothyroxine (SYNTHROID) 137 MCG tablet Take 137 mcg by mouth daily. Current dose 137 mcg daily     LORazepam (ATIVAN) 0.5 MG tablet Take 0.5 mg by mouth as needed for anxiety.     olmesartan (BENICAR) 20 MG tablet Take 20 mg by mouth daily.     pregabalin (LYRICA) 50 MG capsule Take 50 mg by mouth 2 (two) times daily.     primidone (MYSOLINE) 50 MG tablet Take 50 mg by mouth daily.       rivaroxaban (XARELTO) 20 MG TABS tablet Take 20 mg by mouth daily.     rosuvastatin (CRESTOR) 20 MG tablet Take 20 mg by mouth daily.     sertraline (ZOLOFT) 50 MG tablet Take 50 mg by mouth daily.     sotalol (BETAPACE) 80 MG tablet Take 80 mg by mouth 2 (two) times daily.     Vitamin D, Ergocalciferol, (DRISDOL) 1.25 MG (50000 UNIT) CAPS capsule Take 50,000 Units by mouth once a week.     No current facility-administered medications for this visit.    Allergies:   Ciprofloxacin and Plaquenil [hydroxychloroquine]    Social History:  The patient  reports that she has never smoked. She has never used smokeless tobacco. She reports that she does not currently use alcohol. She reports that she does not currently use drugs.   Family History:  The patient's ***family history includes Breast cancer in her maternal aunt, maternal aunt, maternal grandmother, paternal aunt, and paternal aunt; Colon cancer in her mother and paternal aunt; Leukemia in her maternal uncle; Melanoma in her daughter and paternal uncle; Prostate cancer in her maternal uncle.  ROS:  Please see the history of present illness.   Otherwise, review of systems are positive for {NONE DEFAULTED:18576}.   All other systems are reviewed and negative.    PHYSICAL EXAM: VS:  There were no vitals taken for this visit. , BMI There is no height or weight on file to calculate BMI. GENERAL:  Well appearing HEENT:  Pupils equal round and reactive, fundi not visualized, oral mucosa unremarkable NECK:  No jugular venous distention, waveform within normal limits, carotid upstroke brisk and symmetric, no bruits, no thyromegaly LYMPHATICS:  No cervical, inguinal adenopathy LUNGS:  Clear to auscultation bilaterally BACK:  No CVA tenderness CHEST:  Unremarkable HEART:  PMI not displaced or sustained,S1 and S2 within normal limits, no S3, no S4, no clicks, no rubs, *** murmurs ABD:  Flat, positive bowel sounds normal in frequency in  pitch, no bruits, no rebound, no guarding, no midline pulsatile mass, no hepatomegaly, no splenomegaly EXT:  2 plus pulses throughout, no edema, no cyanosis no clubbing SKIN:  No rashes no nodules NEURO:  Cranial nerves II through XII grossly intact, motor grossly intact throughout PSYCH:  Cognitively intact, oriented to person place and time    EKG:  EKG {ACTION; IS/IS [id number] ordered today. The ekg ordered today demonstrates ***   Recent Labs: 08/05/2022: ALT 17; BUN 17; Creatinine 0.75; Hemoglobin 13.4; Platelet Count 212; Potassium 3.9; Sodium 141    Lipid Panel No results found for: "CHOL", "TRIG", "HDL", "CHOLHDL", "VLDL", "LDLCALC", "LDLDIRECT"    Wt Readings from Last 3 Encounters:  08/05/22 258 lb 11 oz (117.3 kg)  06/02/22 258 lb (117 kg)  04/23/22 258 lb 6 oz (117.2 kg)      Other studies Reviewed: Additional studies/ records that were reviewed today include: ***. Review of the above records demonstrates:  Please see elsewhere in the note.  ***   ASSESSMENT AND PLAN:  Dyslipidemia:  ***  Hypertension:  ***   Current medicines are reviewed at length with the patient today.  The patient {ACTIONS; HAS/DOES NOT HAVE:19233} concerns regarding medicines.  The following changes have been made:  {PLAN; NO CHANGE:13088:s}  Labs/ tests ordered today include: *** No orders of the defined types were placed in this encounter.    Disposition:   FU with ***    Signed, Minus Breeding, MD  09/30/2022 5:14 PM    [place]

## 2022-10-01 ENCOUNTER — Encounter: Payer: Self-pay | Admitting: Cardiology

## 2022-10-01 ENCOUNTER — Ambulatory Visit: Payer: Medicare Other | Attending: Cardiology | Admitting: Cardiology

## 2022-10-01 VITALS — BP 118/64 | HR 62 | Ht 63.0 in | Wt 253.6 lb

## 2022-10-01 DIAGNOSIS — I482 Chronic atrial fibrillation, unspecified: Secondary | ICD-10-CM | POA: Insufficient documentation

## 2022-10-01 MED ORDER — DILTIAZEM HCL ER COATED BEADS 240 MG PO CP24: 240.0000 mg | ORAL_CAPSULE | Freq: Every day | ORAL | 3 refills | Status: DC

## 2022-10-01 NOTE — Patient Instructions (Signed)
Medication Instructions:  Stop Amlodipine Stop Cardizem Stop Primidone Start Cardizem CD 240 mg daily  *If you need a refill on your cardiac medications before your next appointment, please call your pharmacy*   Lab Work: None ordered   Testing/Procedures: None ordered   Follow-Up: At [facility], you and your health needs are our priority.  As part of our continuing mission to provide you with exceptional heart care, we have created designated Provider Care Teams.  These Care Teams include your primary Cardiologist (physician) and Advanced Practice Providers (APPs -  Physician Assistants and Nurse Practitioners) who all work together to provide you with the care you need, when you need it.  We recommend signing up for the patient portal called "MyChart".  Sign up information is provided on this After Visit Summary.  MyChart is used to connect with patients for Virtual Visits (Telemedicine).  Patients are able to view lab/test results, encounter notes, upcoming appointments, etc.  Non-urgent messages can be sent to your provider as well.   To learn more about what you can do with MyChart, go to [email or web address].    Your next appointment:  4 to 6 weeks    Provider:  Dr.Hochrein

## 2022-10-25 DIAGNOSIS — I10 Essential (primary) hypertension: Secondary | ICD-10-CM | POA: Insufficient documentation

## 2022-10-25 NOTE — Progress Notes (Unsigned)
  Cardiology Office Note:   Date:  10/26/2022  ID:  Lisa Cardenas, DOB 1951-04-01, MRN [id number]  History of Present Illness:   Lisa Cardenas is a 72 y.o. female who presents for evaluation of atrial fibrillation.  She has previously been seen in [place].  She has had paroxysmal atrial fibrillation since 2019.  She was treated with sotalol.  Echocardiogram previously has demonstrated well-preserved ejection fraction.  There is concern of elevated pulmonary pressures.  Most recent appears to be 2021.  There was an EKG from May 2023 which was a regular tachycardia which I suspect was blood wor  I see an EKG from February 2024 which was atrial fibrillation with rapid rate.  Since I saw her she had 1 episode of tachypalpitations with this lasting off and on for about 10 hours.  She is not particular bothered by it.  She otherwise feels okay.  She brings a blood pressure diary blood pressures are well-controlled.  ROS: As stated in the HPI and negative for all other systems.  Studies Reviewed:    EKG:  NA   Risk Assessment/Calculations:    CHA2DS2-VASc Score = 3  } This indicates a 3.2% annual risk of stroke. The patient's score is based upon: CHF History: 0 HTN History: 1 Diabetes History: 0 Stroke History: 0 Vascular Disease History: 0 Age Score: 1 Gender Score: 1   Physical Exam:   VS:  BP 128/76   Pulse 64   Ht 5\' 4"  (1.626 m)   Wt 256 lb (116.1 kg)   SpO2 97%   BMI 43.94 kg/m    Wt Readings from Last 3 Encounters:  10/26/22 256 lb (116.1 kg)  10/01/22 253 lb 9.6 oz (115 kg)  08/05/22 258 lb 11 oz (117.3 kg)     GEN: Well nourished, well developed in no acute distress NECK: No JVD; No carotid bruits CARDIAC: RRR, no murmurs, rubs, gallops RESPIRATORY:  Clear to auscultation without rales, wheezing or rhonchi  ABDOMEN: Soft, non-tender, non-distended EXTREMITIES:  No edema; No deformity   ASSESSMENT AND PLAN:   Hypertension:   Her blood pressure  is at target.  No change in therapy.   Atrial fibrillation: She tolerates the Xarelto.  CHA2DS2-VASc is 3.  She tolerates anticoagulation.  She tolerates antiarrhythmics as listed.  No change in therapy.        Signed, Rollene Rotunda, MD

## 2022-10-26 ENCOUNTER — Ambulatory Visit: Payer: Medicare Other | Attending: Cardiology | Admitting: Cardiology

## 2022-10-26 ENCOUNTER — Encounter: Payer: Self-pay | Admitting: Cardiology

## 2022-10-26 VITALS — BP 128/76 | HR 64 | Ht 64.0 in | Wt 256.0 lb

## 2022-10-26 DIAGNOSIS — I482 Chronic atrial fibrillation, unspecified: Secondary | ICD-10-CM

## 2022-10-26 DIAGNOSIS — I10 Essential (primary) hypertension: Secondary | ICD-10-CM | POA: Diagnosis present

## 2022-10-26 NOTE — Patient Instructions (Signed)
Medication Instructions:  Your physician recommends that you continue on your current medications as directed. Please refer to the Current Medication list given to you today.  *If you need a refill on your cardiac medications before your next appointment, please call your pharmacy*   Follow-Up: At [facility], you and your health needs are our priority.  As part of our continuing mission to provide you with exceptional heart care, we have created designated Provider Care Teams.  These Care Teams include your primary Cardiologist (physician) and Advanced Practice Providers (APPs -  Physician Assistants and Nurse Practitioners) who all work together to provide you with the care you need, when you need it.  We recommend signing up for the patient portal called "MyChart".  Sign up information is provided on this After Visit Summary.  MyChart is used to connect with patients for Virtual Visits (Telemedicine).  Patients are able to view lab/test results, encounter notes, upcoming appointments, etc.  Non-urgent messages can be sent to your provider as well.   To learn more about what you can do with MyChart, go to [email or web address].    Your next appointment:   6 month(s)  Provider:   James Hochrein, MD    

## 2022-10-28 ENCOUNTER — Encounter: Payer: Self-pay | Admitting: Internal Medicine

## 2022-10-29 MED ORDER — AMOXICILLIN-POT CLAVULANATE 875-125 MG PO TABS: 1.0000 | ORAL_TABLET | Freq: Two times a day (BID) | ORAL | 0 refills | Status: AC

## 2022-10-29 NOTE — Telephone Encounter (Signed)
RX for Augmentin sent to [facility] in [place]. Patient notified via MyChart.

## 2022-10-29 NOTE — Telephone Encounter (Signed)
Augmentin 875 twice daily x 7 days for diverticulitis She should let us know if pain does not completely resolve

## 2022-11-05 ENCOUNTER — Encounter: Payer: Self-pay | Admitting: Cardiology

## 2022-11-05 DIAGNOSIS — I482 Chronic atrial fibrillation, unspecified: Secondary | ICD-10-CM

## 2022-11-05 NOTE — Telephone Encounter (Signed)
Called patient. She has been in afib since Tuesday evening.  Only symptom is fatigue but her blood pressure today is 94/58.  No SOB or chest discomfort or lightheadedness.  Monitoring HR by Apple Watch - rates jumping between 55-117.   Not used any decongestants.     On Xarelto, Cardizem 240 mg daily and sotalol 80 mg BID.   She said anytime in the past this has lasted about 10-12 hrs at most and then flips back to NSR.  Adv I will forward to Dr. Antoine Poche to let him know and for any new recommendations and then we will call her back.

## 2022-11-13 NOTE — Telephone Encounter (Signed)
Left detailed message on AFIB scheduling to call pt and schedule an appt next week.

## 2022-11-23 ENCOUNTER — Ambulatory Visit (HOSPITAL_COMMUNITY)
Admission: RE | Admit: 2022-11-23 | Discharge: 2022-11-23 | Disposition: A | Discharge status: Home / Self Care | Payer: Medicare Other | Source: Ambulatory Visit | Attending: Internal Medicine | Admitting: Internal Medicine

## 2022-11-23 VITALS — BP 124/70 | HR 55 | Ht 64.0 in | Wt 254.2 lb

## 2022-11-23 DIAGNOSIS — I4891 Unspecified atrial fibrillation: Secondary | ICD-10-CM

## 2022-11-23 DIAGNOSIS — Z7901 Long term (current) use of anticoagulants: Secondary | ICD-10-CM | POA: Diagnosis not present

## 2022-11-23 DIAGNOSIS — I480 Paroxysmal atrial fibrillation: Secondary | ICD-10-CM | POA: Diagnosis present

## 2022-11-23 DIAGNOSIS — E669 Obesity, unspecified: Secondary | ICD-10-CM | POA: Insufficient documentation

## 2022-11-23 DIAGNOSIS — I10 Essential (primary) hypertension: Secondary | ICD-10-CM | POA: Diagnosis not present

## 2022-11-23 DIAGNOSIS — D6869 Other thrombophilia: Secondary | ICD-10-CM | POA: Insufficient documentation

## 2022-11-23 DIAGNOSIS — Z6841 Body Mass Index (BMI) 40.0 and over, adult: Secondary | ICD-10-CM | POA: Diagnosis not present

## 2022-11-23 DIAGNOSIS — K219 Gastro-esophageal reflux disease without esophagitis: Secondary | ICD-10-CM | POA: Insufficient documentation

## 2022-11-23 DIAGNOSIS — R0683 Snoring: Secondary | ICD-10-CM | POA: Diagnosis not present

## 2022-11-23 DIAGNOSIS — E785 Hyperlipidemia, unspecified: Secondary | ICD-10-CM | POA: Insufficient documentation

## 2022-11-23 NOTE — Progress Notes (Signed)
Primary Care Physician: Romeo Rabon, MD Primary Cardiologist: Dr. Antoine Poche Primary Electrophysiologist: None Referring Physician: Dr. Berneta Sages Lisa Cardenas is a 72 y.o. female with a history of breast cancer, HLD, GERD, HTN, and atrial fibrillation who presents for consultation in the [facility].  The patient was initially diagnosed with atrial fibrillation in 2019 per record review. She has previously been seen in [place], [place], and treated with sotalol. She currently takes cardizem 240 mg daily and sotalol 80 mg BID. Patient is on Xarelto 20 mg daily for a CHADS2VASC score of 3.  On evaluation today, she is in SR. Review of records show patient contacted cardiology office on 4/25 stating she was in Afib. She contacted again on 5/3 stating she was back in Afib again. She feels fatigued when in Afib and can feel increased heart rate. She has been on sotalol since ~October 2023. She uses her Apple Watch to help detect when she is in Afib. She has had about 3 or 4 episodes of Afib since January of this year.   She has stopped drinking caffeinated beverages. She does not drink alcohol. She thinks she might snore but not sure if stops breathing.   She is compliant with anticoagulation and has not missed any doses. She has no bleeding concerns.  Today, she denies symptoms of palpitations, chest pain, shortness of breath, orthopnea, PND, lower extremity edema, dizziness, presyncope, syncope, snoring, daytime somnolence, bleeding, or neurologic sequela. The patient is tolerating medications without difficulties and is otherwise without complaint today.   Atrial Fibrillation Risk Factors:  she does have symptoms or diagnosis of sleep apnea. she does not have a history of rheumatic fever. she does not have a history of alcohol use. The patient does not have a history of early familial atrial fibrillation or other arrhythmias.  she has a BMI of Body mass  index is 43.63 kg/m.Marland Kitchen Filed Weights   11/23/22 1121  Weight: 115.3 kg    Family History  Problem Relation Age of Onset   Colon cancer Mother        dx 52   Breast cancer Maternal Grandmother    Breast cancer Maternal Aunt    Prostate cancer Maternal Uncle    Breast cancer Paternal Aunt    Melanoma Paternal Uncle    Breast cancer Maternal Aunt    Leukemia Maternal Uncle    Breast cancer Paternal Aunt    Colon cancer Paternal Aunt    Melanoma Daughter      Atrial Fibrillation Management history:  Previous antiarrhythmic drugs: Sotalol Previous cardioversions: Unknown Previous ablations: None Anticoagulation history: Xarelto 20 mg daily   Past Medical History:  Diagnosis Date   A-fib (HCC)    no issues since May 2020   Cancer [facility])    Diverticulosis    Elevated cholesterol    Hypertension    Obesity    Pancreatitis    Thyroid disease    Trigeminal neuralgia    Past Surgical History:  Procedure Laterality Date   ABDOMINAL HYSTERECTOMY  1980   BLADDER REPAIR     BREAST BIOPSY Left 11/2019   BREAST LUMPECTOMY Left 11/23/2019   BREAST LUMPECTOMY WITH RADIOACTIVE SEED AND SENTINEL LYMPH NODE BIOPSY Left 11/23/2019   Procedure: LEFT BREAST LUMPECTOMY WITH RADIOACTIVE SEED AND LEFT AXILLARY SENTINEL LYMPH NODE BIOPSY;  Surgeon: Emelia Loron, MD;  Location:  SURGERY CENTER;  Service: General;  Laterality: Left;  PEC BLOCK   [facility]   [facility]ENOSCOPY     [facility]   KNEE ARTHROSCOPY Left    TONSILLECTOMY      Current Outpatient Medications  Medication Sig Dispense Refill   anastrozole (ARIMIDEX) 1 MG tablet Take 1 tablet (1 mg total) by mouth daily. 90 tablet 3   atorvastatin (LIPITOR) 20 MG tablet Take 20 mg by mouth daily.     celecoxib (CELEBREX) 200 MG capsule Take 200 mg by mouth as needed.     diltiazem (CARDIZEM CD) 240 MG 24 hr capsule Take 1 capsule (240 mg total) by mouth daily. 90 capsule 3   diltiazem (CARDIZEM) 30 MG  tablet 1 tab(s) orally Every 6 hours until rate returns to normal. Only takes as needed for elevated heart rate.     esomeprazole (NEXIUM) 40 MG capsule Take 40 mg by mouth 2 (two) times daily as needed.     furosemide (LASIX) 20 MG tablet Take 1 tablet by mouth as needed.     levothyroxine (SYNTHROID) 137 MCG tablet Take 137 mcg by mouth daily. Current dose 137 mcg daily     LORazepam (ATIVAN) 0.5 MG tablet Take 0.5 mg by mouth as needed for anxiety.     olmesartan (BENICAR) 20 MG tablet Take 20 mg by mouth daily.     ondansetron (ZOFRAN-ODT) 8 MG disintegrating tablet PLACE 1 TABLET 3 TIMES A DAY BY TRANSLINGUAL ROUTE AS NEEDED FOR 30 DAYS.     pregabalin (LYRICA) 50 MG capsule Take 50 mg by mouth 2 (two) times daily.     rivaroxaban (XARELTO) 20 MG TABS tablet Take 20 mg by mouth daily.     senna (SENOKOT) 8.6 MG tablet Take 1 tablet by mouth daily.     sertraline (ZOLOFT) 50 MG tablet Take 50 mg by mouth daily.     sotalol (BETAPACE) 80 MG tablet Take 80 mg by mouth 2 (two) times daily.     Vitamin D, Ergocalciferol, (DRISDOL) 1.25 MG (50000 UNIT) CAPS capsule Take 50,000 Units by mouth once a week. (Patient not taking: Reported on 11/23/2022)     No current facility-administered medications for this encounter.    Allergies  Allergen Reactions   Ciprofloxacin Other (See Comments)   Hydroxychloroquine     Increased heart rate  Other Reaction(s): Not available   Topiramate     Other Reaction(s): other    Social History   Socioeconomic History   Marital status: Married    Spouse name: Not on file   Number of children: 3   Years of education: Not on file   Highest education level: Not on file  Occupational History   Not on file  Tobacco Use   Smoking status: Never   Smokeless tobacco: Never  Vaping Use   Vaping Use: Never used  Substance and Sexual Activity   Alcohol use: Not Currently   Drug use: Not Currently   Sexual activity: Not on file  Other Topics Concern   Not  on file  Social History Narrative   Not on file   Social Determinants of Health   Financial Resource Strain: Not on file  Food Insecurity: Not on file  Transportation Needs: Not on file  Physical Activity: Not on file  Stress: Not on file  Social Connections: Not on file  Intimate Partner Violence: Not on file     ROS- All systems are reviewed and negative except as per the HPI above.  Physical Exam: Vitals:   11/23/22 1121  BP: 124/70  Pulse: Marland Kitchen)  55  Weight: 115.3 kg  Height: 5\' 4"  (1.626 m)    GEN- The patient is a well appearing female, alert and oriented x 3 today.   Head- normocephalic, atraumatic Eyes-  Sclera clear, conjunctiva pink Ears- hearing intact Oropharynx- clear Neck- supple  Lungs- Clear to ausculation bilaterally, normal work of breathing Heart- Regular bradycardic rate and rhythm, no murmurs, rubs or gallops  GI- soft, NT, ND, + BS Extremities- no clubbing, cyanosis, or edema MS- no significant deformity or atrophy Skin- no rash or lesion Psych- euthymic mood, full affect Neuro- strength and sensation are intact  Wt Readings from Last 3 Encounters:  11/23/22 115.3 kg  10/26/22 116.1 kg  10/01/22 115 kg    EKG today demonstrates  Vent. rate 55 BPM PR interval 178 ms QRS duration 78 ms QT/QTcB 474/453 ms P-R-T axes 20 35 21 Sinus bradycardia Otherwise normal ECG When compared with ECG of 20-Nov-2019 10:37, PREVIOUS ECG IS PRESENT  Echo 07/08/2017 outside study demonstrated: Normal EF Mild concentric LVH  Epic records are reviewed at length today.  CHA2DS2-VASc Score = 3  The patient's score is based upon: CHF History: 0 HTN History: 1 Diabetes History: 0 Stroke History: 0 Vascular Disease History: 0 Age Score: 1 Gender Score: 1       ASSESSMENT AND PLAN: Paroxysmal Atrial Fibrillation (ICD10:  I48.0) The patient's CHA2DS2-VASc score is 3, indicating a 3.2% annual risk of stroke.    She is in SR today.   Education  provided about Afib with visual diagram. It seems like her sotalol is not helping reduce Afib burden at this time. Discussion about medication treatments and ablation in detail. She could be a candidate for ablation once under BMI 40 / continued weight loss. If she chose medication therapy, would likely perform stress test as ischemic evaluation prior to starting flecainide. We can consider amiodarone as a bridge if she chooses ablation, but declines to take long term. Tikosyn explained along with necessity for 3 night hospital stay for load. After discussion, she would like to think about her choices and whether she'd like to pursue medication or ablation. She would like to go on her trip to [place] and will call when she returns.  Continue current medication therapy without change.  Secondary Hypercoagulable State (ICD10:  D68.69) The patient is at significant risk for stroke/thromboembolism based upon her CHA2DS2-VASc Score of 3.  Continue Rivaroxaban (Xarelto).  No missed doses  3. Obesity Body mass index is 43.63 kg/m. Lifestyle modification was discussed at length including regular exercise and weight reduction. Encouraged walking as tolerated.   4. Snoring with concern for Obstructive sleep apnea Sleep study ordered today.   5. HTN Well controlled today; no changes.    Patient will call clinic to confirm treatment plan.    Lake Bells, PA-C Afib Clinic [facility] [address] [place], [place] [address] [phone] 11/23/2022 2:34 PM

## 2022-12-22 ENCOUNTER — Telehealth: Payer: Self-pay | Admitting: Internal Medicine

## 2022-12-22 NOTE — Telephone Encounter (Signed)
Inbound call from patient stating she has been having severe abdominal pain and diarrhea for the last 3 days. Requesting a call back to discuss further and recommendations that could help. Please advise, thank you.

## 2022-12-22 NOTE — Telephone Encounter (Signed)
Pt states she has been out of the country and now she is back and having diarrhea and some nausea. Pt scheduled to see Alcide Evener NP tomorrow at 11am. Pt aware of appt.

## 2022-12-23 ENCOUNTER — Ambulatory Visit (INDEPENDENT_AMBULATORY_CARE_PROVIDER_SITE_OTHER): Payer: Medicare Other | Admitting: Nurse Practitioner

## 2022-12-23 ENCOUNTER — Encounter: Payer: Self-pay | Admitting: Nurse Practitioner

## 2022-12-23 ENCOUNTER — Other Ambulatory Visit (INDEPENDENT_AMBULATORY_CARE_PROVIDER_SITE_OTHER): Payer: Medicare Other

## 2022-12-23 VITALS — BP 100/60 | HR 64 | Ht 64.0 in | Wt 250.0 lb

## 2022-12-23 DIAGNOSIS — R197 Diarrhea, unspecified: Secondary | ICD-10-CM

## 2022-12-23 DIAGNOSIS — R1030 Lower abdominal pain, unspecified: Secondary | ICD-10-CM

## 2022-12-23 LAB — CBC WITH DIFFERENTIAL/PLATELET
Basophils Absolute: 0.1 10*3/uL (ref 0.0–0.1)
Basophils Relative: 1 % (ref 0.0–3.0)
Eosinophils Absolute: 0.1 10*3/uL (ref 0.0–0.7)
Eosinophils Relative: 0.8 % (ref 0.0–5.0)
HCT: 41.1 % (ref 36.0–46.0)
Hemoglobin: 13.3 g/dL (ref 12.0–15.0)
Lymphocytes Relative: 11.5 % — ABNORMAL LOW (ref 12.0–46.0)
Lymphs Abs: 1 10*3/uL (ref 0.7–4.0)
MCHC: 32.3 g/dL (ref 30.0–36.0)
MCV: 80.1 fl (ref 78.0–100.0)
Monocytes Absolute: 0.6 10*3/uL (ref 0.1–1.0)
Monocytes Relative: 6.3 % (ref 3.0–12.0)
Neutro Abs: 7.2 10*3/uL (ref 1.4–7.7)
Neutrophils Relative %: 80.4 % — ABNORMAL HIGH (ref 43.0–77.0)
Platelets: 230 10*3/uL (ref 150.0–400.0)
RBC: 5.13 Mil/uL — ABNORMAL HIGH (ref 3.87–5.11)
RDW: 15.8 % — ABNORMAL HIGH (ref 11.5–15.5)
WBC: 8.9 10*3/uL (ref 4.0–10.5)

## 2022-12-23 LAB — COMPREHENSIVE METABOLIC PANEL
ALT: 15 U/L (ref 0–35)
AST: 10 U/L (ref 0–37)
Albumin: 4.3 g/dL (ref 3.5–5.2)
Alkaline Phosphatase: 68 U/L (ref 39–117)
BUN: 13 mg/dL (ref 6–23)
CO2: 28 mEq/L (ref 19–32)
Calcium: 9.7 mg/dL (ref 8.4–10.5)
Chloride: 101 mEq/L (ref 96–112)
Creatinine, Ser: 0.72 mg/dL (ref 0.40–1.20)
GFR: 84.72 mL/min (ref >60.00)
Glucose, Bld: 104 mg/dL — ABNORMAL HIGH (ref 70–99)
Potassium: 3.9 mEq/L (ref 3.5–5.1)
Sodium: 138 mEq/L (ref 135–145)
Total Bilirubin: 0.8 mg/dL (ref 0.2–1.2)
Total Protein: 7.5 g/dL (ref 6.0–8.3)

## 2022-12-23 NOTE — Progress Notes (Signed)
12/23/2022 Lisa Cardenas [id number] Apr 24, 1951   Chief Complaint: Diarrhea  History of Present Illness: Lisa Cardenas is a 72 year old female with a past medical history of hypertension, hyperlipidemia, atrial fibrillation on Xarelto, hypothyroidism, obesity, essential tremors, hiatal hernia, GERD, diverticulosis and colon polyps. She is known by Dr. Rhea Belton. She presents today for further evaluation regarding explosive diarrhea. She started having nonbloody watery diarrhea with lower abdominal cramping discomfort which on Saturday 6/8. She had 6 episodes of diarrhea for the first 2 days and 1 or two episodes yesterday. Earlier this morning, she described having central lower abdominal pressure and pass a little gas with bits of stool and a moderate amount of medium red blood per the rectum. No further stool or hematochezia since then.  No anorectal pain.  She had a supply of Augmentin at home (left over from RX prescribed Dr. Rhea Belton 10/28/2022 for suspected diverticulitis with lower abdominal pain and loose stools) and she took one tab bid on Monday and Tuesday. She traveled to [place] with her husband 5/27 - 12/13/2022 and she felt well during her time of travel and was passing normal bowel movements.    She has recently started eating more fruits and vegetables. Her last dose of Xarelto (takes for afib) was taken at 6 PM on 6/11.  Her most recent colonoscopy was 06/02/2022 which identified 4 or adenomatous polyps removed from the colon, moderate diverticulosis in the sigmoid colon and internal hemorrhoids.  No history of colitis or diverticular bleed.  He denies having any chest pain, shortness of breath, dizziness or lightheadedness.     Latest Ref Rng & Units 08/05/2022   10:44 AM 04/23/2022    2:44 PM 05/19/2021   11:21 AM  CBC  WBC 4.0 - 10.5 K/uL 5.9  6.3  5.7   Hemoglobin 12.0 - 15.0 g/dL 08.6  57.8  46.9   Hematocrit 36.0 - 46.0 % 40.8  40.6  42.5   Platelets 150 - 400 K/uL 212   220.0  202        Latest Ref Rng & Units 08/05/2022   10:44 AM 04/23/2022    2:44 PM 05/19/2021   11:21 AM  CMP  Glucose 70 - 99 mg/dL 629  87  528   BUN 8 - 23 mg/dL 17  14  17    Creatinine 0.44 - 1.00 mg/dL 4.13  2.44  0.10   Sodium 135 - 145 mmol/L 141  141  140   Potassium 3.5 - 5.1 mmol/L 3.9  4.0  4.1   Chloride 98 - 111 mmol/L 104  103  104   CO2 22 - 32 mmol/L 32  30  28   Calcium 8.9 - 10.3 mg/dL 9.9  9.7  9.5   Total Protein 6.5 - 8.1 g/dL 7.1  7.2  6.9   Total Bilirubin 0.3 - 1.2 mg/dL 0.6  0.4  0.6   Alkaline Phos 38 - 126 U/L 91  82  87   AST 15 - 41 U/L 10  12  16    ALT 0 - 44 U/L 17  20  32     PAST GI PROCEDURES:  EGD 06/02/2022: - Normal esophagus.  - 3 cm hiatal hernia.  - A single gastric polyp. Resected and retrieved. Clip (MR conditional) was placed.  - Gastritis. Biopsied.  - Normal examined duodenum.  Colonoscopy 06/02/2022: - One 4 mm polyp in the ascending colon, removed with a cold snare. Resected and retrieved.  -  One 4 mm polyp in the transverse colon, removed with a cold snare. Resected and retrieved.  - Two 4 to 5 mm polyps in the descending colon, removed with a cold snare. Resected and retrieved.  - Moderate diverticulosis in the sigmoid colon. - Internal hemorrhoids. 1. Surgical [P], gastric polyp - GASTRIC HYPERPLASTIC POLYP - NEGATIVE FOR HELICOBACTER ORGANISMS ON H&E STAIN 2. Surgical [P], gastric antrum and gastric body - ANTRAL AND OXYNTIC MUCOSAE WITH CHRONIC, FOCALLY ACTIVE NONSPECIFIC GASTRITIS AND REACTIVE CHANGES - IMMUNOHISTOCHEMICAL STAIN FOR HELICOBACTER ORGANISMS IS NEGATIVE 3. Surgical [P], colon, descending, ascending, transverse, polyp (4) - TUBULAR ADENOMA(S) (MULTIPLE FRAGMENTS) - NEGATIVE FOR HIGH-GRADE DYSPLASIA OR MALIGNANCY  Current Outpatient Medications on File Prior to Visit  Medication Sig Dispense Refill   anastrozole (ARIMIDEX) 1 MG tablet Take 1 tablet (1 mg total) by mouth daily. 90 tablet 3    atorvastatin (LIPITOR) 20 MG tablet Take 20 mg by mouth daily.     celecoxib (CELEBREX) 200 MG capsule Take 200 mg by mouth as needed.     diltiazem (CARDIZEM CD) 240 MG 24 hr capsule Take 1 capsule (240 mg total) by mouth daily. 90 capsule 3   esomeprazole (NEXIUM) 40 MG capsule Take 40 mg by mouth 2 (two) times daily as needed.     levothyroxine (SYNTHROID) 137 MCG tablet Take 137 mcg by mouth daily. Current dose 137 mcg daily     LORazepam (ATIVAN) 0.5 MG tablet Take 0.5 mg by mouth as needed for anxiety.     olmesartan (BENICAR) 20 MG tablet Take 20 mg by mouth daily.     ondansetron (ZOFRAN-ODT) 8 MG disintegrating tablet PLACE 1 TABLET 3 TIMES A DAY BY TRANSLINGUAL ROUTE AS NEEDED FOR 30 DAYS.     pregabalin (LYRICA) 50 MG capsule Take 50 mg by mouth 2 (two) times daily.     rivaroxaban (XARELTO) 20 MG TABS tablet Take 20 mg by mouth daily.     senna (SENOKOT) 8.6 MG tablet Take 1 tablet by mouth daily.     sertraline (ZOLOFT) 50 MG tablet Take 50 mg by mouth daily.     sotalol (BETAPACE) 80 MG tablet Take 80 mg by mouth 2 (two) times daily.     Vitamin D, Ergocalciferol, (DRISDOL) 1.25 MG (50000 UNIT) CAPS capsule Take 50,000 Units by mouth once a week.     No current facility-administered medications on file prior to visit.   Allergies  Allergen Reactions   Ciprofloxacin Other (See Comments)   Hydroxychloroquine     Increased heart rate  Other Reaction(s): Not available   Topamax [Topiramate]     Other Reaction(s): other   Current Medications, Allergies, Past Medical History, Past Surgical History, Family History and Social History were reviewed in [employer] record.  Review of Systems:   Constitutional: Negative for fever, sweats, chills or weight loss.  Respiratory: Negative for shortness of breath.   Cardiovascular: Negative for chest pain, palpitations and leg swelling.  Gastrointestinal: See HPI.  Musculoskeletal: + Bilateral knee  pain. Neurological: Negative for dizziness, headaches or paresthesias.   Physical Exam: BP 100/60 (BP Location: Right Arm, Patient Position: Sitting, Cuff Size: Normal)   Pulse 64   Ht 5\' 4"  (1.626 m) Comment: height measured without shoes  Wt 250 lb (113.4 kg)   BMI 42.91 kg/m   General: 72 year old female in no acute distress. Head: Normocephalic and atraumatic. Eyes: No scleral icterus. Conjunctiva pink . Ears: Normal auditory acuity. Mouth: Dentition intact. No ulcers  or lesions.  Lungs: Clear throughout to auscultation. Heart: Regular rate and rhythm, no murmur. Abdomen: Soft, nondistended. No masses or hepatomegaly. Normal bowel sounds x 4 quadrants.  Rectal: Deferred.  Musculoskeletal: Symmetrical with no gross deformities. Extremities: No edema. Neurological: Alert oriented x 4. No focal deficits.  Active essential tremors. Psychological: Alert and cooperative. Normal mood and affect  Assessment and Recommendations:  72 year old female with diarrhea, lower abdominal pain with one episode of hematochezia this morning.  Suspect infectious colitis. Rule out C. Diff (treated with a course of Augmentin 10/28/2022 for suspected diverticulitis). Recent travel to [place] 5/27 - 12/13/2022. No evidence of IBD/colitis per colonoscopy 05/2022. -Stat CBC, CMP -[facility] C. Diff/GI pathogen panel and O & P -CTAP if abdominal pain persist/worsens -Clear liquid diet for now -Hold 6pm dose of Xarelto for now -Patient instructed to go to the ED if her abdominal pain worsens or if she passes further blood from the rectum  History of colon polyps.  Four tubular adenomatous polyps removed from the colon per colonoscopy 05/2022. -Next colon polyp surveillance colonoscopy due 05/2025  Sigmoid diverticulosis per colonoscopy 05/2022, treated for suspected diverticulitis 10/28/2022 as noted above.  History of atrial fibrillation on Xarelto, last dose 6 PM on 12/22/2022

## 2022-12-23 NOTE — Progress Notes (Signed)
Addendum: Reviewed and agree with assessment and management plan. Agree with plan to hold a single dose of Xarelto.  If she has another episode of hematochezia I would recommend she be evaluated in the ER.  Certainly if this were to be associated with presyncopal symptoms. Lab work today is reassuring with stable hemoglobin Await infectious stool panel given diarrhea, bloody in nature, and recent antibiotic use. Cuong Moorman, Carie Caddy, MD

## 2022-12-23 NOTE — Patient Instructions (Addendum)
Your provider has requested that you go to the basement level for lab work before leaving today. Press "B" on the elevator. The lab is located at the first door on the left as you exit the elevator.  Your provider has ordered "[facility]" stool testing for you. You have received a kit from our office today containing all necessary supplies to complete this test. Please carefully read the stool collection instructions provided in the kit before opening the accompanying materials. In addition, be sure to place the label from the top left corner of the laboratory request sheet onto the "puritan opti-swab" tube that is supplied in the kit. This label should include your full name and date of birth. After completing the test, you should secure the purtian tube into the specimen biohazard bag. The laboratory request information sheet (including date and time of specimen collection) should be placed into the outside pocket of the specimen biohazard bag and returned to the [facility] lab with 2 days of collection.    Go to the ED if your abdominal pain worsens or if you pass further blood from the rectum.  Clear liquid diet for now.  Due to recent changes in healthcare laws, you may see the results of your imaging and laboratory studies on MyChart before your provider has had a chance to review them.  We understand that in some cases there may be results that are confusing or concerning to you. Not all laboratory results come back in the same time frame and the provider may be waiting for multiple results in order to interpret others.  Please give [place] 48 hours in order for your provider to thoroughly review all the results before contacting the office for clarification of your results.   Thank you for trusting me with your gastrointestinal care!   Alcide Evener, CRNP

## 2022-12-23 NOTE — Progress Notes (Signed)
I called patient, discussed WBC and Hg levels stable, hold Xarelto tonight and resume tomorrow evening if no further blood per the rectum. Patient stated she passed gas with a little stool matter that looked like raw hamburger.  She will push fluids, ok to advance to full liquid this evening. She will go to the ED if she has any further blood per the rectum. Patient will call me in am 6/13 with further update.

## 2022-12-24 ENCOUNTER — Telehealth: Payer: Self-pay | Admitting: Nurse Practitioner

## 2022-12-24 NOTE — Telephone Encounter (Signed)
Spoke with patient regarding NP recommendations. Denies any further bleeding or diarrhea. Stools are mucousy. Intermittent, lower abdominal pain (4/10) is still the same. She's been advised to return stool samples at earliest convenience & to call back with any questions/concerns.

## 2022-12-24 NOTE — Telephone Encounter (Signed)
I called the patient at this time to obtain a symptom update.  I left a detailed message on her voicemail to return my call and let me know if she is having any further abdominal pain, diarrhea or blood per the rectum.  Patient to take advance to a soft diet this morning and to take Xarelto this evening if she has not had any further blood per the rectum.  Lisa Cardenas, please contact the patient at noon to obtain an update if she has not returned my call by that time.  Thank you

## 2022-12-25 ENCOUNTER — Ambulatory Visit
Admission: RE | Admit: 2022-12-25 | Discharge: 2022-12-25 | Disposition: A | Discharge status: Home / Self Care | Payer: Medicare Other | Source: Ambulatory Visit | Attending: Hematology | Admitting: Hematology

## 2022-12-25 ENCOUNTER — Other Ambulatory Visit: Payer: Medicare Other

## 2022-12-25 DIAGNOSIS — Z853 Personal history of malignant neoplasm of breast: Secondary | ICD-10-CM

## 2022-12-25 DIAGNOSIS — R197 Diarrhea, unspecified: Secondary | ICD-10-CM

## 2022-12-25 DIAGNOSIS — R1030 Lower abdominal pain, unspecified: Secondary | ICD-10-CM

## 2022-12-28 ENCOUNTER — Encounter: Payer: Medicare Other | Admitting: Cardiology

## 2022-12-31 LAB — OVA AND PARASITE EXAMINATION
CONCENTRATE RESULT:: NONE SEEN
MICRO NUMBER:: 15084319
SPECIMEN QUALITY:: ADEQUATE
TRICHROME RESULT:: NONE SEEN

## 2023-01-05 DIAGNOSIS — R197 Diarrhea, unspecified: Secondary | ICD-10-CM

## 2023-01-05 DIAGNOSIS — R1030 Lower abdominal pain, unspecified: Secondary | ICD-10-CM

## 2023-01-06 NOTE — Telephone Encounter (Signed)
Lisa Cardenas, if patient is still having loose stools, I recommend for her to complete the [facility]rix stool culture with C. Diff. THX.

## 2023-01-06 NOTE — Telephone Encounter (Signed)
Lisa Cardenas-  This patient recently saw you on 12/23/22 for diarrhea, lower abdominal pain and h hematochezia. An O&P as well as diatherix (C Diff/GI Pathogen). Unfortunately, only the O&P resulted despite the patient turning in both specimens. I contacted our lab and it does not appear that the tube was ever sent out.   Patient says at this point, her symptoms have improved but she continues with loose stool and some cramping.   Patient lives in [place], [place], so retesting may be a bit of a hassle for her. Do you feel like she still needs the diatherix testing with her continued symptoms? If so, I will have her pick up a new kit and reorder tests.

## 2023-01-11 ENCOUNTER — Encounter: Payer: Medicare Other | Admitting: Cardiology

## 2023-01-18 ENCOUNTER — Other Ambulatory Visit: Payer: Self-pay | Admitting: Hematology

## 2023-01-18 DIAGNOSIS — R921 Mammographic calcification found on diagnostic imaging of breast: Secondary | ICD-10-CM

## 2023-02-02 ENCOUNTER — Inpatient Hospital Stay: Admission: RE | Admit: 2023-02-02 | Payer: Medicare Other | Source: Ambulatory Visit

## 2023-02-03 ENCOUNTER — Inpatient Hospital Stay: Payer: Medicare Other

## 2023-02-03 ENCOUNTER — Other Ambulatory Visit: Payer: Self-pay

## 2023-02-03 ENCOUNTER — Inpatient Hospital Stay: Payer: Medicare Other | Attending: Adult Health | Admitting: Adult Health

## 2023-02-03 VITALS — BP 122/88 | HR 71 | Temp 97.7°F | Resp 18 | Ht 64.0 in | Wt 252.9 lb

## 2023-02-03 DIAGNOSIS — Z923 Personal history of irradiation: Secondary | ICD-10-CM | POA: Insufficient documentation

## 2023-02-03 DIAGNOSIS — Z79811 Long term (current) use of aromatase inhibitors: Secondary | ICD-10-CM | POA: Insufficient documentation

## 2023-02-03 DIAGNOSIS — Z808 Family history of malignant neoplasm of other organs or systems: Secondary | ICD-10-CM | POA: Insufficient documentation

## 2023-02-03 DIAGNOSIS — Z803 Family history of malignant neoplasm of breast: Secondary | ICD-10-CM | POA: Insufficient documentation

## 2023-02-03 DIAGNOSIS — Z806 Family history of leukemia: Secondary | ICD-10-CM | POA: Insufficient documentation

## 2023-02-03 DIAGNOSIS — C50412 Malignant neoplasm of upper-outer quadrant of left female breast: Secondary | ICD-10-CM | POA: Insufficient documentation

## 2023-02-03 DIAGNOSIS — Z170 Estrogen receptor positive status [ER+]: Secondary | ICD-10-CM | POA: Diagnosis not present

## 2023-02-03 DIAGNOSIS — Z800 Family history of malignant neoplasm of digestive organs: Secondary | ICD-10-CM | POA: Insufficient documentation

## 2023-02-03 LAB — CBC WITH DIFFERENTIAL (CANCER CENTER ONLY)
Abs Immature Granulocytes: 0.04 10*3/uL (ref 0.00–0.07)
Basophils Absolute: 0 10*3/uL (ref 0.0–0.1)
Basophils Relative: 1 %
Eosinophils Absolute: 0.2 10*3/uL (ref 0.0–0.5)
Eosinophils Relative: 3 %
HCT: 41.6 % (ref 36.0–46.0)
Hemoglobin: 13.3 g/dL (ref 12.0–15.0)
Immature Granulocytes: 1 %
Lymphocytes Relative: 16 %
Lymphs Abs: 1 10*3/uL (ref 0.7–4.0)
MCH: 25.8 pg — ABNORMAL LOW (ref 26.0–34.0)
MCHC: 32 g/dL (ref 30.0–36.0)
MCV: 80.6 fL (ref 80.0–100.0)
Monocytes Absolute: 0.3 10*3/uL (ref 0.1–1.0)
Monocytes Relative: 4 %
Neutro Abs: 5.1 10*3/uL (ref 1.7–7.7)
Neutrophils Relative %: 75 %
Platelet Count: 206 10*3/uL (ref 150–400)
RBC: 5.16 MIL/uL — ABNORMAL HIGH (ref 3.87–5.11)
RDW: 16.5 % — ABNORMAL HIGH (ref 11.5–15.5)
WBC Count: 6.6 10*3/uL (ref 4.0–10.5)
nRBC: 0 % (ref 0.0–0.2)

## 2023-02-03 LAB — CMP (CANCER CENTER ONLY)
ALT: 21 U/L (ref 0–44)
AST: 13 U/L — ABNORMAL LOW (ref 15–41)
Albumin: 4.1 g/dL (ref 3.5–5.0)
Alkaline Phosphatase: 89 U/L (ref 38–126)
Anion gap: 6 (ref 5–15)
BUN: 13 mg/dL (ref 8–23)
CO2: 30 mmol/L (ref 22–32)
Calcium: 10.1 mg/dL (ref 8.9–10.3)
Chloride: 104 mmol/L (ref 98–111)
Creatinine: 0.7 mg/dL (ref 0.44–1.00)
GFR, Estimated: >60 mL/min (ref >60)
Glucose, Bld: 134 mg/dL — ABNORMAL HIGH (ref 70–99)
Potassium: 4 mmol/L (ref 3.5–5.1)
Sodium: 140 mmol/L (ref 135–145)
Total Bilirubin: 0.6 mg/dL (ref 0.3–1.2)
Total Protein: 6.6 g/dL (ref 6.5–8.1)

## 2023-02-03 MED ORDER — ANASTROZOLE 1 MG PO TABS: 1.0000 mg | ORAL_TABLET | Freq: Every day | ORAL | 3 refills | Status: DC

## 2023-02-03 NOTE — Progress Notes (Signed)
Cancer Center Cancer Follow up:    Romeo Rabon, MD [address] [place] [address]   DIAGNOSIS:  Cancer Staging  Malignant neoplasm of upper-outer quadrant of left breast in female, estrogen receptor positive (HCC) Staging form: Breast, AJCC 8th Edition - Clinical: Stage IA (cT1a, cN0, cM0, G2, ER+, PR+, HER2-) - Signed by Loa Socks, NP on 11/01/2019 Stage prefix: Initial diagnosis Histologic grading system: 3 grade system - Pathologic stage from 11/23/2019: Stage IA (pT1b, pN0, cM0, G2, ER+, PR+, HER2-) - Signed by Loa Socks, NP on 12/13/2019 Stage prefix: Initial diagnosis Histologic grading system: 3 grade system   SUMMARY OF ONCOLOGIC HISTORY: Oncology History  Malignant neoplasm of upper-outer quadrant of left breast in female, estrogen receptor positive (HCC)  10/26/2019 Initial Diagnosis   Malignant neoplasm of upper-outer quadrant of left breast in female, estrogen receptor positive (HCC)   11/01/2019 Cancer Staging   Staging form: Breast, AJCC 8th Edition - Clinical: Stage IA (cT1a, cN0, cM0, G2, ER+, PR+, HER2-) - Signed by Loa Socks, NP on 11/01/2019   11/03/2019 Genetic Testing   Negative genetic testing. No pathogenic variants identified on the [facility] Breast Cancer STAT Panel + Common Hereditary Cancers Panel. The report date is 11/03/2019.  The STAT Breast cancer panel offered by [facility] includes sequencing and rearrangement analysis for the following 9 genes:  ATM, BRCA1, BRCA2, CDH1, CHEK2, PALB2, PTEN, STK11 and TP53.    The Common Hereditary Cancers Panel offered by [facility] includes sequencing and/or deletion duplication testing of the following 48 genes: APC, ATM, AXIN2, BARD1, BMPR1A, BRCA1, BRCA2, BRIP1, CDH1, CDKN2A (p14ARF), CDKN2A (p16INK4a), CKD4, CHEK2, CTNNA1, DICER1, EPCAM (Deletion/duplication testing only), GREM1 (promoter region deletion/duplication testing only), KIT, MEN1, MLH1, MSH2,  MSH3, MSH6, MUTYH, NBN, NF1, NHTL1, PALB2, PDGFRA, PMS2, POLD1, POLE, PTEN, RAD50, RAD51C, RAD51D, RNF43, SDHB, SDHC, SDHD, SMAD4, SMARCA4. STK11, TP53, TSC1, TSC2, and VHL.  The following genes were evaluated for sequence changes only: SDHA and HOXB13 c.251G>A variant only.   11/23/2019 Cancer Staging   Staging form: Breast, AJCC 8th Edition - Pathologic stage from 11/23/2019: Stage IA (pT1b, pN0, cM0, G2, ER+, PR+, HER2-) - Signed by Loa Socks, NP on 12/13/2019   11/23/2019 Surgery   status post left lumpectomy and sentinel lymph node sampling for a pT1b pN0, stage IA invasive lobular carcinoma, with negative margins    11/23/2019 Oncotype testing   18/5%   01/02/2020 - 01/24/2020 Radiation Therapy   Site Technique Total Dose (Gy) Dose per Fx (Gy) Completed Fx Beam Energies  Breast, Left: Breast_Lt 3D 42.56/42.56 2.66 16/16 10X, 15X     02/2020 -  Anti-estrogen oral therapy   Anastrozole     CURRENT THERAPY: Anastrozole  INTERVAL HISTORY: Cherae Marton 72 y.o. female returns for f/u of her estrogen positive breast cancer on treatment with Anastrozole  She is tolerating treatment well and has no concerns around taking it.  She underwent left breast diagnostic mammogram follow-up calcifations on    Patient Active Problem List   Diagnosis Date Noted   Hypercoagulable state due to paroxysmal atrial fibrillation (HCC) 11/23/2022   Essential hypertension 10/25/2022   Trigeminal neuralgia of left side of face 01/26/2020   Genetic testing 11/07/2019   Malignant neoplasm of upper-outer quadrant of left breast in female, estrogen receptor positive (HCC) 10/26/2019   Atrial fibrillation (HCC) 10/26/2019   Chronic anticoagulation 10/26/2019   Morbid obesity with BMI of 40.0-44.9, adult (HCC) 10/26/2019   Family history  of breast cancer    Family history of pancreatic cancer    Family history of prostate cancer    Family history of melanoma     is allergic to  ciprofloxacin, hydroxychloroquine, and topamax [topiramate].  MEDICAL HISTORY: Past Medical History:  Diagnosis Date   A-fib [facility])    no issues since May 2020   Cancer [facility])    Diverticulosis    Elevated cholesterol    Hypertension    Obesity    Pancreatitis    Thyroid disease    Trigeminal neuralgia     SURGICAL HISTORY: Past Surgical History:  Procedure Laterality Date   ABDOMINAL HYSTERECTOMY  1980   BLADDER REPAIR     BREAST BIOPSY Left 11/2019   BREAST LUMPECTOMY Left 11/23/2019   BREAST LUMPECTOMY WITH RADIOACTIVE SEED AND SENTINEL LYMPH NODE BIOPSY Left 11/23/2019   Procedure: LEFT BREAST LUMPECTOMY WITH RADIOACTIVE SEED AND LEFT AXILLARY SENTINEL LYMPH NODE BIOPSY;  Surgeon: Emelia Loron, MD;  Location: [facility];  Service: General;  Laterality: Left;  PEC BLOCK   [facility]   KNEE ARTHROSCOPY Left    TONSILLECTOMY      SOCIAL HISTORY: Social History   Socioeconomic History   Marital status: Married    Spouse name: Not on file   Number of children: 3   Years of education: Not on file   Highest education level: Not on file  Occupational History   Not on file  Tobacco Use   Smoking status: Never   Smokeless tobacco: Never  Vaping Use   Vaping status: Never Used  Substance and Sexual Activity   Alcohol use: Not Currently   Drug use: Not Currently   Sexual activity: Not on file  Other Topics Concern   Not on file  Social History Narrative   Not on file   Social Determinants of Health   Financial Resource Strain: Not on file  Food Insecurity: Not on file  Transportation Needs: Not on file  Physical Activity: Not on file  Stress: Not on file  Social Connections: Not on file  Intimate Partner Violence: Not on file    FAMILY HISTORY: Family History  Problem Relation Age of Onset   Colon cancer Mother        dx 7   Breast cancer Maternal Grandmother    Breast cancer Maternal  Aunt    Prostate cancer Maternal Uncle    Breast cancer Paternal Aunt    Melanoma Paternal Uncle    Breast cancer Maternal Aunt    Leukemia Maternal Uncle    Breast cancer Paternal Aunt    Colon cancer Paternal Aunt    Melanoma Daughter     Review of Systems - Oncology    PHYSICAL EXAMINATION    Vitals:   02/03/23 1034  BP: 122/88  Pulse: 71  Resp: 18  Temp: 97.7 F (36.5 C)  SpO2: 95%    Physical Exam  LABORATORY DATA:  CBC    Component Value Date/Time   WBC 6.6 02/03/2023 1020   WBC 8.9 12/23/2022 1200   RBC 5.16 (H) 02/03/2023 1020   HGB 13.3 02/03/2023 1020   HCT 41.6 02/03/2023 1020   PLT 206 02/03/2023 1020   MCV 80.6 02/03/2023 1020   MCH 25.8 (L) 02/03/2023 1020   MCHC 32.0 02/03/2023 1020   RDW 16.5 (H) 02/03/2023 1020   LYMPHSABS 1.0 02/03/2023 1020   MONOABS 0.3 02/03/2023  1020   EOSABS 0.2 02/03/2023 1020   BASOSABS 0.0 02/03/2023 1020    CMP     Component Value Date/Time   NA 140 02/03/2023 1020   K 4.0 02/03/2023 1020   CL 104 02/03/2023 1020   CO2 30 02/03/2023 1020   GLUCOSE 134 (H) 02/03/2023 1020   BUN 13 02/03/2023 1020   CREATININE 0.70 02/03/2023 1020   CALCIUM 10.1 02/03/2023 1020   PROT 6.6 02/03/2023 1020   ALBUMIN 4.1 02/03/2023 1020   AST 13 (L) 02/03/2023 1020   ALT 21 02/03/2023 1020   ALKPHOS 89 02/03/2023 1020   BILITOT 0.6 02/03/2023 1020   GFRNONAA >60 02/03/2023 1020   GFRAA >60 11/20/2019 1100   GFRAA >60 10/26/2019 1507       PENDING LABS:   RADIOGRAPHIC STUDIES:  No results found.   PATHOLOGY:     ASSESSMENT and THERAPY PLAN:   No problem-specific Assessment & Plan notes found for this encounter.   No orders of the defined types were placed in this encounter.   All questions were answered. The patient knows to call the clinic with any problems, questions or concerns. We can certainly see the patient much sooner if necessary. This note was electronically signed. Noreene Filbert,  NP 02/03/2023

## 2023-02-04 ENCOUNTER — Other Ambulatory Visit: Payer: Self-pay

## 2023-02-04 DIAGNOSIS — C50412 Malignant neoplasm of upper-outer quadrant of left female breast: Secondary | ICD-10-CM

## 2023-02-04 NOTE — Assessment & Plan Note (Signed)
Lisa Cardenas is a 72 year old woman with history of stage Ia ER/PR positive invasive lobular carcinoma of the left breast diagnosed in April 2021 status post lumpectomy, adjuvant radiation, and adjuvant antiestrogen therapy with anastrozole that she began in August 2021.  Stage Ia left breast invasive lobular carcinoma: She is tolerating anastrozole well and will continue this.  We discussed her continuing it for the 10 years which she tells me she plans to do.  Her next mammogram has not yet been scheduled at the breast center.  I asked my nurse to reach out to the breast center to make sure that they have the orders. Bone health: Her most recent bone density testing occurred in October 2021 and was normal.  She will repeat testing in January 2025.  I reviewed with her that we like to try to keep patients at the same location of their previous bone density testing so we can understand if their bone density has worsened.  We discussed calcium vitamin D and weightbearing exercises. Health maintenance: I recommended she continue to follow-up with her primary care provider and we briefly discussed healthy diet and exercise.  Lisa Cardenas will return in 6 months for labs and follow-up with Dr. Blake Divine.  She knows to call for any questions or concerns that may arise between now and her next appointment with [place].

## 2023-02-10 ENCOUNTER — Encounter: Payer: Medicare Other | Admitting: Cardiology

## 2023-02-25 ENCOUNTER — Ambulatory Visit
Admission: RE | Admit: 2023-02-25 | Discharge: 2023-02-25 | Disposition: A | Discharge status: Home / Self Care | Payer: Medicare Other | Source: Ambulatory Visit | Attending: Hematology | Admitting: Hematology

## 2023-02-25 DIAGNOSIS — E2839 Other primary ovarian failure: Secondary | ICD-10-CM

## 2023-03-01 ENCOUNTER — Encounter: Payer: Self-pay | Admitting: Hematology

## 2023-05-03 NOTE — Progress Notes (Unsigned)
Cardiology Office Note:   Date:  05/04/2023  ID:  Lisa, Cardenas April 24, 1951, MRN [id number]  History of Present Illness:   Lisa Cardenas is a 72 y.o. female who presents for evaluation of atrial fibrillation.  She has previously been seen in [place].  She has had paroxysmal atrial fibrillation since 2019.  She was treated with sotalol.  Echocardiogram previously has demonstrated well-preserved ejection fraction.  There is concern of elevated pulmonary pressures.  Most recent appears to be 2021.   I see an EKG from February 2024 which was atrial fibrillation with rapid rate.  Since I saw her she has been seen in the atrial fib clinic.  They have discussed the possibility of flecainide which would require stress testing versus Tikosyn which would require 3 days of hospitalization.  She probably would not be a good ablation candidate with her weight and questionable sleep apnea.  However, at this point she is really not having significant symptomatic paroxysms of the fibrillation.  She does feel it about once a month.  She says may be up to 16 hours but as far as we can tell the rate is controlled.  She has an Apple watch but she does not have the recordings with her for me to see the trend of heart rate.  She does not have any chest pressure, neck or arm discomfort.  Unfortunately she has problems with her knee and has to have injection and she has some balance issues which limit her mobility.  She walks with a cane.  ROS:   Snoring, hypersomnolence  Studies Reviewed:    EKG:  NA   Risk Assessment/Calculations:    CHA2DS2-VASc Score = 3  } This indicates a 3.2% annual risk of stroke. The patient's score is based upon: CHF History: 0 HTN History: 1 Diabetes History: 0 Stroke History: 0 Vascular Disease History: 0 Age Score: 1 Gender Score: 1   Physical Exam:   VS:  BP 120/70   Pulse 63   Ht 5\' 4"  (1.626 m)   Wt 251 lb (113.9 kg)   SpO2 94%   BMI 43.08 kg/m     Wt Readings from Last 3 Encounters:  05/04/23 251 lb (113.9 kg)  02/03/23 252 lb 14.4 oz (114.7 kg)  12/23/22 250 lb (113.4 kg)     GEN: No  acute distress.   Neck: No  JVD Cardiac: RRR, no murmurs, rubs, or gallops.  Respiratory: Clear   to auscultation bilaterally. GI: Soft, nontender, non-distended, normal bowel sounds  MS:  No edema; No deformity.    ASSESSMENT AND PLAN:   Hypertension:   Her blood pressure is at target.  No change in therapy.    Atrial fibrillation: She tolerates the Xarelto.  We had a long discussion about this.  We do not think she is really failed sotalol at this point.  She seems to have reasonable rate control but she can keep an eye on this and send me records to let me know what her heart rate is.   Snoring:   Her STOP-BANG is 5.  We are going to reschedule a home sleep study.   This was canceled previously because she was not sleeping but this is improved.   Signed, Rollene Rotunda, MD

## 2023-05-04 ENCOUNTER — Ambulatory Visit: Payer: Medicare Other | Attending: Cardiology | Admitting: Cardiology

## 2023-05-04 ENCOUNTER — Encounter: Payer: Self-pay | Admitting: Cardiology

## 2023-05-04 VITALS — BP 120/70 | HR 63 | Ht 64.0 in | Wt 251.0 lb

## 2023-05-04 DIAGNOSIS — I4891 Unspecified atrial fibrillation: Secondary | ICD-10-CM | POA: Diagnosis present

## 2023-05-04 DIAGNOSIS — I10 Essential (primary) hypertension: Secondary | ICD-10-CM | POA: Diagnosis present

## 2023-05-04 DIAGNOSIS — R0683 Snoring: Secondary | ICD-10-CM | POA: Insufficient documentation

## 2023-05-04 NOTE — Patient Instructions (Addendum)
WatchPAT?  Is a FDA cleared portable home sleep study test that uses a watch and 3 points of contact to monitor 7 different channels, including your heart rate, oxygen saturations, body position, snoring, and chest motion.  The study is easy to use from the comfort of your own home and accurately detect sleep apnea.  Before bed, you attach the chest sensor, attached the sleep apnea bracelet to your nondominant hand, and attach the finger probe.  After the study, the raw data is downloaded from the watch and scored for apnea events.   For more information: [email or web address]  Patient Testing Instructions:  Do not put battery into the device until bedtime when you are ready to begin the test. Please call the support number if you need assistance after following the instructions below: 24 hour support line- [phone] or [facility] support at [phone] (option 2)  Download the IntelWatchPAT One" app through the google play store or App Store  Be sure to turn on or enable access to bluetooth in settlings on your smartphone/ device  Make sure no other bluetooth devices are on and within the vicinity of your smartphone/ device and WatchPAT watch during testing.  Make sure to leave your smart phone/ device plugged in and charging all night.  When ready for bed:  Follow the instructions step by step in the WatchPAT One App to activate the testing device. For additional instructions, including video instruction, visit the WatchPAT One video on Youtube. You can search for WatchPat One within Youtube (video is 4 minutes and 18 seconds) or enter: https://youtube/watch?[email or web address]_vbiwxE Please note: You will be prompted to enter a Pin to connect via bluetooth when starting the test. The PIN will be assigned to you when you receive the test.  The device is disposable, but it recommended that you retain the device until you receive a call letting you know the study has been received and the  results have been interpreted.  We will let you know if the study did not transmit to [place] properly after the test is completed. You do not need to call us to confirm the receipt of the test.  Please complete the test within 48 hours of receiving PIN.   Frequently Asked Questions:  What is Watch Dennie Bible one?  A single use fully disposable home sleep apnea testing device and will not need to be returned after completion.  What are the requirements to use WatchPAT one?  The be able to have a successful watchpat one sleep study, you should have your Watch pat one device, your smart phone, watch pat one app, your PIN number and Internet access What type of phone do I need?  You should have a smart phone that uses Android 5.1 and above or any Iphone with IOS 10 and above How can I download the WatchPAT one app?  Based on your device type search for WatchPAT one app either in google play for android devices or APP store for Iphone's Where will I get my PIN for the study?  Your PIN will be provided by your physician's office. It is used for authentication and if you lose/forget your PIN, please reach out to your providers office.  I do not have Internet at home. Can I do WatchPAT one study?  WatchPAT One needs Internet connection throughout the night to be able to transmit the sleep data. You can use your home/local internet or your cellular's data package. However, it is always recommended to use home/local Internet. It  is estimated that between 20MB-30MB will be used with each study.However, the application will be looking for space in the phone to start the study.  What happens if I lose internet or bluetooth connection?  During the internet disconnection, your phone will not be able to transmit the sleep data. All the data, will be stored in your phone. As soon as the internet connection is back on, the phone will being sending the sleep data. During the bluetooth disconnection, WatchPAT one will not  be able to to send the sleep data to your phone. Data will be kept in the [facility] one until two devices have bluetooth connection back on. As soon as the connection is back on, WatchPAT one will send the sleep data to the phone.  How long do I need to wear the WatchPAT one?  After you start the study, you should wear the device at least 6 hours.  How far should I keep my phone from the device?  During the night, your phone should be within 15 feet.  What happens if I leave the room for restroom or other reasons?  Leaving the room for any reason will not cause any problem. As soon as your get back to the room, both devices will reconnect and will continue to send the sleep data. Can I use my phone during the sleep study?  Yes, you can use your phone as usual during the study. But it is recommended to put your watchpat one on when you are ready to go to bed.  How will I get my study results?  A soon as you completed your study, your sleep data will be sent to the provider. They will then share the results with you when they are ready.     Follow-Up: At [facility], you and your health needs are our priority.  As part of our continuing mission to provide you with exceptional heart care, we have created designated Provider Care Teams.  These Care Teams include your primary Cardiologist (physician) and Advanced Practice Providers (APPs -  Physician Assistants and Nurse Practitioners) who all work together to provide you with the care you need, when you need it.  We recommend signing up for the patient portal called "MyChart".  Sign up information is provided on this After Visit Summary.  MyChart is used to connect with patients for Virtual Visits (Telemedicine).  Patients are able to view lab/test results, encounter notes, upcoming appointments, etc.  Non-urgent messages can be sent to your provider as well.   To learn more about what you can do with MyChart, go to [email or web address].     Your next appointment:   3 month(s)  Provider:    Afib clinic Your physician wants you to follow-up in: 6 months with Dr Antoine Poche.  You will receive a reminder letter in the mail two months in advance. If you don't receive a letter, please call our office to schedule the follow-up appointment.

## 2023-05-18 ENCOUNTER — Encounter: Payer: Self-pay | Admitting: Cardiology

## 2023-05-20 ENCOUNTER — Telehealth: Payer: Self-pay | Admitting: *Deleted

## 2023-05-20 NOTE — Telephone Encounter (Signed)
Ordering provider [facility] Associated diagnoses: R06.83 WatchPAT PA obtained on 05/20/2023 by Latrelle Dodrill, CMA. Authorization: No; tracking ID NO PA REQ FOR HST Patient notified of PIN ([id number]) on 05/20/2023 via Notification Method: phone.

## 2023-05-22 ENCOUNTER — Encounter (INDEPENDENT_AMBULATORY_CARE_PROVIDER_SITE_OTHER): Payer: Self-pay | Admitting: Cardiology

## 2023-05-22 DIAGNOSIS — G4733 Obstructive sleep apnea (adult) (pediatric): Secondary | ICD-10-CM | POA: Diagnosis not present

## 2023-05-28 ENCOUNTER — Ambulatory Visit: Payer: Medicare Other | Attending: Cardiology

## 2023-05-28 DIAGNOSIS — R0683 Snoring: Secondary | ICD-10-CM

## 2023-05-28 DIAGNOSIS — I4891 Unspecified atrial fibrillation: Secondary | ICD-10-CM

## 2023-05-28 NOTE — Procedures (Signed)
   SLEEP STUDY REPORT Patient Information Study Date: 05/22/2023 Patient Name: Lisa Cardenas Patient ID: [id number] Birth Date: 1950/09/07 Age: 72 Gender: Female BMI: 42.9 (W=251 lb, H=5' 4'') Referring Physician: Rollene Rotunda, MD  TEST DESCRIPTION: Home sleep apnea testing was completed using the WatchPat, a Type 1 device, utilizing  peripheral arterial tonometry (PAT), chest movement, actigraphy, pulse oximetry, pulse rate, body position and snore.  AHI was calculated with apnea and hypopnea using valid sleep time as the denominator. RDI includes apneas,  hypopneas, and RERAs. The data acquired and the scoring of sleep and all associated events were performed in  accordance with the recommended standards and specifications as outlined in the AASM Manual for the Scoring of  Sleep and Associated Events 2.2.0 (2015). FINDINGS:  1. Severe Obstructive Sleep Apnea with AHI 41.1/hr.   2. No significant Central Sleep Apnea with pAHIc 8.9/hr.  3. Oxygen desaturations as low as 78%.  4. Severe snoring was present. O2 sats were < 88% for 309.3 min.  5. Total sleep time was 7 hrs and 50 min.  6. 14.5% of total sleep time was spent in REM sleep.   7. Normal sleep onset latency at 16 min.   8. Prolonged REM sleep onset latency at 257 min.   9. Total awakenings were 7.  10. Arrhythmia detection: None  DIAGNOSIS:  Severe Obstructive Sleep Apnea (G47.33) Nocturnal Hypoxemia  RECOMMENDATIONS: 1. Clinical correlation of these findings is necessary. The decision to treat obstructive sleep apnea (OSA) is usually  based on the presence of apnea symptoms or the presence of associated medical conditions such as Hypertension,  Congestive Heart Failure, Atrial Fibrillation or Obesity. The most common symptoms of OSA are snoring, gasping for  breath while sleeping, daytime sleepiness and fatigue.   2. Initiating apnea therapy is recommended given the presence of symptoms and/or associated conditions.   Recommend proceeding with one of the following:   a. Auto-CPAP therapy with a pressure range of 5-20cm H2O.   b. An oral appliance (OA) that can be obtained from certain dentists with expertise in sleep medicine. These are  primarily of use in non-obese patients with mild and moderate disease.   c. An ENT consultation which may be useful to look for specific causes of obstruction and possible treatment  options.   d. If patient is intolerant to PAP therapy, consider referral to ENT for evaluation for hypoglossal nerve stimulator.   3. Close follow-up is necessary to ensure success with CPAP or oral appliance therapy for maximum benefit .  4. A follow-up oximetry study on CPAP is recommended to assess the adequacy of therapy and determine the need  for supplemental oxygen or the potential need for Bi-level therapy. An arterial blood gas to determine the adequacy of  baseline ventilation and oxygenation should also be considered.  5. Healthy sleep recommendations include: adequate nightly sleep (normal 7-9 hrs/night), avoidance of caffeine after  noon and alcohol near bedtime, and maintaining a sleep environment that is cool, dark and quiet.  6. Weight loss for overweight patients is recommended. Even modest amounts of weight loss can significantly  improve the severity of sleep apnea.  7. Snoring recommendations include: weight loss where appropriate, side sleeping, and avoidance of alcohol before  bed.  8. Operation of motor vehicle should be avoided when sleepy.  Signature: Armanda Magic, MD; [facility]; Diplomat, American Board of Sleep  Medicine Electronically Signed: 05/28/2023 9:50:24 A

## 2023-06-14 ENCOUNTER — Telehealth: Payer: Self-pay | Admitting: *Deleted

## 2023-06-14 NOTE — Telephone Encounter (Signed)
The patient has been notified of the result and verbalized understanding.  All questions (if any) were answered. Latrelle Dodrill, CMA 06/14/2023 12:14 PM    Patient has declined further testing at this time. She will think about it and speak to her provider at her next appointment. She will contact us if and when she is ready to move forward.

## 2023-06-14 NOTE — Telephone Encounter (Signed)
-----   Message from Armanda Magic sent at 05/28/2023  9:51 AM EST ----- Please let patient know that they have sleep apnea.  Recommend therapeutic CPAP titration for treatment of patient's sleep disordered breathing.  If unable to perform an in lab titration then initiate ResMed auto CPAP from 4 to 15cm H2O with heated humidity and mask of choice and overnight pulse ox on CPAP.

## 2023-06-28 ENCOUNTER — Ambulatory Visit
Admission: RE | Admit: 2023-06-28 | Discharge: 2023-06-28 | Disposition: A | Discharge status: Home / Self Care | Payer: Medicare Other | Source: Ambulatory Visit | Attending: Adult Health | Admitting: Adult Health

## 2023-06-28 DIAGNOSIS — C50412 Malignant neoplasm of upper-outer quadrant of left female breast: Secondary | ICD-10-CM

## 2023-08-03 ENCOUNTER — Other Ambulatory Visit: Payer: Medicare Other

## 2023-08-04 ENCOUNTER — Ambulatory Visit (HOSPITAL_COMMUNITY)
Admission: RE | Admit: 2023-08-04 | Discharge: 2023-08-04 | Disposition: A | Discharge status: Home / Self Care | Payer: Medicare Other | Source: Ambulatory Visit | Attending: Internal Medicine | Admitting: Internal Medicine

## 2023-08-04 VITALS — BP 114/64 | HR 52 | Ht 64.0 in | Wt 252.8 lb

## 2023-08-04 DIAGNOSIS — E785 Hyperlipidemia, unspecified: Secondary | ICD-10-CM | POA: Diagnosis not present

## 2023-08-04 DIAGNOSIS — Z6841 Body Mass Index (BMI) 40.0 and over, adult: Secondary | ICD-10-CM | POA: Insufficient documentation

## 2023-08-04 DIAGNOSIS — I10 Essential (primary) hypertension: Secondary | ICD-10-CM | POA: Insufficient documentation

## 2023-08-04 DIAGNOSIS — R0683 Snoring: Secondary | ICD-10-CM | POA: Diagnosis not present

## 2023-08-04 DIAGNOSIS — D6869 Other thrombophilia: Secondary | ICD-10-CM | POA: Diagnosis not present

## 2023-08-04 DIAGNOSIS — I480 Paroxysmal atrial fibrillation: Secondary | ICD-10-CM | POA: Diagnosis not present

## 2023-08-04 DIAGNOSIS — Z7901 Long term (current) use of anticoagulants: Secondary | ICD-10-CM | POA: Diagnosis present

## 2023-08-04 DIAGNOSIS — I4891 Unspecified atrial fibrillation: Secondary | ICD-10-CM | POA: Diagnosis present

## 2023-08-04 DIAGNOSIS — E669 Obesity, unspecified: Secondary | ICD-10-CM | POA: Insufficient documentation

## 2023-08-04 DIAGNOSIS — K219 Gastro-esophageal reflux disease without esophagitis: Secondary | ICD-10-CM | POA: Diagnosis not present

## 2023-08-04 DIAGNOSIS — Z853 Personal history of malignant neoplasm of breast: Secondary | ICD-10-CM | POA: Insufficient documentation

## 2023-08-04 DIAGNOSIS — Z79899 Other long term (current) drug therapy: Secondary | ICD-10-CM | POA: Insufficient documentation

## 2023-08-04 LAB — MAGNESIUM: Magnesium: 2 mg/dL (ref 1.7–2.4)

## 2023-08-04 LAB — BASIC METABOLIC PANEL
Anion gap: 14 (ref 5–15)
BUN: 18 mg/dL (ref 8–23)
CO2: 23 mmol/L (ref 22–32)
Calcium: 9.6 mg/dL (ref 8.9–10.3)
Chloride: 102 mmol/L (ref 98–111)
Creatinine, Ser: 0.74 mg/dL (ref 0.44–1.00)
GFR, Estimated: >60 mL/min (ref >60)
Glucose, Bld: 99 mg/dL (ref 70–99)
Potassium: 3.9 mmol/L (ref 3.5–5.1)
Sodium: 139 mmol/L (ref 135–145)

## 2023-08-04 NOTE — Progress Notes (Signed)
Primary Care Physician: Romeo Rabon, MD Primary Cardiologist: Dr. Antoine Poche Primary Electrophysiologist: None Referring Physician: Dr. Berneta Sages Lisa Cardenas is a 73 y.o. female with a history of breast cancer, HLD, GERD, HTN, and atrial fibrillation who presents for consultation in the [facility].  The patient was initially diagnosed with atrial fibrillation in 2019 per record review. She has previously been seen in [place], [place], and treated with sotalol. She currently takes cardizem 240 mg daily and sotalol 80 mg BID. Patient is on Xarelto 20 mg daily for a CHADS2VASC score of 3.  On evaluation today, she is in SR. Review of records show patient contacted cardiology office on 4/25 stating she was in Afib. She contacted again on 5/3 stating she was back in Afib again. She feels fatigued when in Afib and can feel increased heart rate. She has been on sotalol since ~October 2023. She uses her Apple Watch to help detect when she is in Afib. She has had about 3 or 4 episodes of Afib since January of this year.   She has stopped drinking caffeinated beverages. She does not drink alcohol. She thinks she might snore but not sure if stops breathing.   She is compliant with anticoagulation and has not missed any doses. She has no bleeding concerns.  On follow up 08/04/23, she is currently in NSR. Seen by Dr. Antoine Poche in October and patient noted ~monthly episodes of Afib that appear to have controlled rates. No missed doses of sotalol or Xarelto. She has noted that acute stress or too much caffeine can be her triggers for Afib.   Today, she denies symptoms of palpitations, chest pain, shortness of breath, orthopnea, PND, lower extremity edema, dizziness, presyncope, syncope, snoring, daytime somnolence, bleeding, or neurologic sequela. The patient is tolerating medications without difficulties and is otherwise without complaint today.   Atrial Fibrillation Risk  Factors:  she does have symptoms or diagnosis of sleep apnea. she does not have a history of rheumatic fever. she does not have a history of alcohol use. The patient does not have a history of early familial atrial fibrillation or other arrhythmias.  she has a BMI of Body mass index is 43.39 kg/m.Marland Kitchen Filed Weights   08/04/23 1111  Weight: 114.7 kg     Family History  Problem Relation Age of Onset   Colon cancer Mother        dx 77   Breast cancer Maternal Grandmother    Breast cancer Maternal Aunt    Prostate cancer Maternal Uncle    Breast cancer Paternal Aunt    Melanoma Paternal Uncle    Breast cancer Maternal Aunt    Leukemia Maternal Uncle    Breast cancer Paternal Aunt    Colon cancer Paternal Aunt    Melanoma Daughter      Atrial Fibrillation Management history:  Previous antiarrhythmic drugs: Sotalol Previous cardioversions: Unknown Previous ablations: None Anticoagulation history: Xarelto 20 mg daily   Past Medical History:  Diagnosis Date   A-fib [facility])    no issues since May 2020   Cancer [facility])    Diverticulosis    Elevated cholesterol    Hypertension    Obesity    Pancreatitis    Thyroid disease    Trigeminal neuralgia    Past Surgical History:  Procedure Laterality Date   ABDOMINAL HYSTERECTOMY  1980   BLADDER REPAIR     BREAST BIOPSY Left 11/2019   BREAST LUMPECTOMY Left 11/23/2019  BREAST LUMPECTOMY WITH RADIOACTIVE SEED AND SENTINEL LYMPH NODE BIOPSY Left 11/23/2019   Procedure: LEFT BREAST LUMPECTOMY WITH RADIOACTIVE SEED AND LEFT AXILLARY SENTINEL LYMPH NODE BIOPSY;  Surgeon: Emelia Loron, MD;  Location: [facility];  Service: General;  Laterality: Left;  PEC BLOCK   [facility]   [facility]   KNEE ARTHROSCOPY Left    TONSILLECTOMY      Current Outpatient Medications  Medication Sig Dispense Refill   anastrozole (ARIMIDEX) 1 MG tablet Take 1 tablet (1 mg total) by mouth daily. 90  tablet 3   atorvastatin (LIPITOR) 20 MG tablet Take 20 mg by mouth daily.     celecoxib (CELEBREX) 200 MG capsule Take 200 mg by mouth as needed.     diltiazem (CARDIZEM CD) 240 MG 24 hr capsule Take 1 capsule (240 mg total) by mouth daily. 90 capsule 3   esomeprazole (NEXIUM) 40 MG capsule Take 40 mg by mouth 2 (two) times daily as needed.     levothyroxine (SYNTHROID) 137 MCG tablet Take 137 mcg by mouth daily. Current dose 137 mcg daily     LORazepam (ATIVAN) 0.5 MG tablet Take 0.5 mg by mouth as needed for anxiety.     olmesartan (BENICAR) 20 MG tablet Take 20 mg by mouth daily.     ondansetron (ZOFRAN-ODT) 8 MG disintegrating tablet PLACE 1 TABLET 3 TIMES A DAY BY TRANSLINGUAL ROUTE AS NEEDED FOR 30 DAYS.     pregabalin (LYRICA) 50 MG capsule Take 50 mg by mouth 2 (two) times daily.     rivaroxaban (XARELTO) 20 MG TABS tablet Take 20 mg by mouth daily.     sertraline (ZOLOFT) 50 MG tablet Take 50 mg by mouth daily.     sotalol (BETAPACE) 80 MG tablet Take 80 mg by mouth 2 (two) times daily.     Vitamin D, Ergocalciferol, (DRISDOL) 1.25 MG (50000 UNIT) CAPS capsule Take 50,000 Units by mouth once a week.     No current facility-administered medications for this encounter.    Allergies  Allergen Reactions   Ciprofloxacin Other (See Comments)   Hydroxychloroquine     Increased heart rate  Other Reaction(s): Not available   Topamax [Topiramate]     Other Reaction(s): other   ROS- All systems are reviewed and negative except as per the HPI above.  Physical Exam: Vitals:   08/04/23 1111  BP: 114/64  Pulse: (!) 52  Weight: 114.7 kg  Height: 5\' 4"  (1.626 m)    GEN- The patient is well appearing, alert and oriented x 3 today.   Neck - no JVD or carotid bruit noted Lungs- Clear to ausculation bilaterally, normal work of breathing Heart- Regular bradycardic rate and rhythm, no murmurs, rubs or gallops, PMI not laterally displaced Extremities- no clubbing, cyanosis, or edema Skin  - no rash or ecchymosis noted   Wt Readings from Last 3 Encounters:  08/04/23 114.7 kg  05/04/23 113.9 kg  02/03/23 114.7 kg    EKG today demonstrates  Vent. rate 52 BPM PR interval 168 ms QRS duration 76 ms QT/QTcB 466/433 ms P-R-T axes 27 36 17 Sinus bradycardia Otherwise normal ECG When compared with ECG of 23-Nov-2022 11:41, PREVIOUS ECG IS PRESENT  Echo 07/08/2017 outside study demonstrated: Normal EF Mild concentric LVH  Epic records are reviewed at length today.  CHA2DS2-VASc Score = 3  The patient's score is based upon: CHF History: 0 HTN History: 1 Diabetes History: 0  Stroke History: 0 Vascular Disease History: 0 Age Score: 1 Gender Score: 1      ASSESSMENT AND PLAN: Paroxysmal Atrial Fibrillation (ICD10:  I48.0) The patient's CHA2DS2-VASc score is 3, indicating a 3.2% annual risk of stroke.    She is in NSR.  Pre previous discussion with Dr. Antoine Poche in October, patient does not appear to have significant symptomatic episodes of Afib. She would like to continue with sotalol which is reasonable. Can consider flecainide with testing prior or Tikosyn going forward as indicated.   High risk medication monitoring (ICD10: R7229428) Patient requires ongoing monitoring for anti-arrhythmic medication which has the potential to cause life threatening arrhythmias or AV block. Qtc stable. Continue sotalol 80 mg BID.  Bmet and mag drawn today.  Secondary Hypercoagulable State (ICD10:  D68.69) The patient is at significant risk for stroke/thromboembolism based upon her CHA2DS2-VASc Score of 3.  Continue Rivaroxaban (Xarelto).  No missed doses.   3. Obesity Body mass index is 43.39 kg/m. Lifestyle modification was discussed at length including regular exercise and weight reduction. Increase exercise as tolerated.  4. Snoring with concern for Obstructive sleep apnea Sleep study done on 05/22/23 showing severe OSA.  5. HTN Her BP looks great today no changes  recommended at this time.   Follow up Afib clinic 1 year.    Lake Bells, PA-C Afib Clinic [facility] [address] [place], [place] [address] [phone] 08/04/2023 1:31 PM

## 2023-08-06 ENCOUNTER — Other Ambulatory Visit: Payer: Self-pay

## 2023-08-06 DIAGNOSIS — C50412 Malignant neoplasm of upper-outer quadrant of left female breast: Secondary | ICD-10-CM

## 2023-08-09 ENCOUNTER — Inpatient Hospital Stay: Payer: Medicare Other | Attending: Hematology | Admitting: Hematology

## 2023-08-09 ENCOUNTER — Inpatient Hospital Stay: Payer: Medicare Other

## 2023-08-09 VITALS — BP 124/72 | HR 54 | Temp 97.3°F | Resp 18 | Wt 252.7 lb

## 2023-08-09 DIAGNOSIS — Z79811 Long term (current) use of aromatase inhibitors: Secondary | ICD-10-CM | POA: Insufficient documentation

## 2023-08-09 DIAGNOSIS — Z79899 Other long term (current) drug therapy: Secondary | ICD-10-CM | POA: Diagnosis not present

## 2023-08-09 DIAGNOSIS — Z923 Personal history of irradiation: Secondary | ICD-10-CM | POA: Diagnosis not present

## 2023-08-09 DIAGNOSIS — Z170 Estrogen receptor positive status [ER+]: Secondary | ICD-10-CM | POA: Insufficient documentation

## 2023-08-09 DIAGNOSIS — C50412 Malignant neoplasm of upper-outer quadrant of left female breast: Secondary | ICD-10-CM | POA: Diagnosis present

## 2023-08-09 DIAGNOSIS — I4891 Unspecified atrial fibrillation: Secondary | ICD-10-CM | POA: Insufficient documentation

## 2023-08-09 LAB — CMP (CANCER CENTER ONLY)
ALT: 19 U/L (ref 0–44)
AST: 11 U/L — ABNORMAL LOW (ref 15–41)
Albumin: 4.2 g/dL (ref 3.5–5.0)
Alkaline Phosphatase: 83 U/L (ref 38–126)
Anion gap: 5 (ref 5–15)
BUN: 14 mg/dL (ref 8–23)
CO2: 30 mmol/L (ref 22–32)
Calcium: 10.1 mg/dL (ref 8.9–10.3)
Chloride: 104 mmol/L (ref 98–111)
Creatinine: 0.74 mg/dL (ref 0.44–1.00)
GFR, Estimated: >60 mL/min (ref >60)
Glucose, Bld: 108 mg/dL — ABNORMAL HIGH (ref 70–99)
Potassium: 4.3 mmol/L (ref 3.5–5.1)
Sodium: 139 mmol/L (ref 135–145)
Total Bilirubin: 0.5 mg/dL (ref 0.0–1.2)
Total Protein: 7 g/dL (ref 6.5–8.1)

## 2023-08-09 LAB — CBC WITH DIFFERENTIAL (CANCER CENTER ONLY)
Abs Immature Granulocytes: 0.01 10*3/uL (ref 0.00–0.07)
Basophils Absolute: 0 10*3/uL (ref 0.0–0.1)
Basophils Relative: 1 %
Eosinophils Absolute: 0.1 10*3/uL (ref 0.0–0.5)
Eosinophils Relative: 2 %
HCT: 44.7 % (ref 36.0–46.0)
Hemoglobin: 14.6 g/dL (ref 12.0–15.0)
Immature Granulocytes: 0 %
Lymphocytes Relative: 16 %
Lymphs Abs: 1.1 10*3/uL (ref 0.7–4.0)
MCH: 27.4 pg (ref 26.0–34.0)
MCHC: 32.7 g/dL (ref 30.0–36.0)
MCV: 84 fL (ref 80.0–100.0)
Monocytes Absolute: 0.3 10*3/uL (ref 0.1–1.0)
Monocytes Relative: 5 %
Neutro Abs: 5.1 10*3/uL (ref 1.7–7.7)
Neutrophils Relative %: 76 %
Platelet Count: 230 10*3/uL (ref 150–400)
RBC: 5.32 MIL/uL — ABNORMAL HIGH (ref 3.87–5.11)
RDW: 15.8 % — ABNORMAL HIGH (ref 11.5–15.5)
WBC Count: 6.7 10*3/uL (ref 4.0–10.5)
nRBC: 0 % (ref 0.0–0.2)

## 2023-08-09 NOTE — Assessment & Plan Note (Signed)
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

## 2023-08-09 NOTE — Progress Notes (Signed)
[facility]   [phone] [phone]   Clinic Follow up Note   Patient Care Team: Romeo Rabon, MD as PCP - General (Internal Medicine) Emelia Loron, MD as Consulting Physician (General Surgery) Pyrtle, Carie Caddy, MD as Consulting Physician (Gastroenterology) Donnelly Angelica, RN as Oncology Nurse Navigator Pershing Proud, RN as Oncology Nurse Navigator Zagol, Driscilla Grammes, MD as Referring Physician (Cardiology) Mercie Eon, MD as Referring Physician (Neurology) Kirstie Mirza, MD as Referring Physician (Neurosurgery) Malachy Mood, MD as Attending Physician (Hematology and Oncology)  Date of Service:  08/09/2023  CHIEF COMPLAINT: f/u of left breast cancer  CURRENT THERAPY:  Adjuvant anastrozole  Oncology History   Malignant neoplasm of upper-outer quadrant of left breast in female, estrogen receptor positive (HCC) -Diagnosed in 09/2019, Oncotype RS 18  -status post left lumpectomy and sentinel lymph node sampling 11/23/2019 for a pT1b pN0, stage IA invasive lobular carcinoma, with negative margins  -Completes adjuvant radiation -on adjuvant anstrozole since 02/2020   Assessment and Plan    Breast Cancer (Stage 1A) On anastrozole for over three years with tolerable hot flashes as the only side effect. Mammogram (Dec 2024) and bone density scan (Aug 2024) were normal. Discussed five-year anastrozole treatment duration, with potential extension to seven years based on future assessments.  Breast index test could be conducted near the five-year mark to aid in decision-making. - Continue anastrozole - Follow-up with nurse practitioner in six months - Follow-up with oncologist in one year  Atrial Fibrillation Under cardiology care. No new symptoms or concerns. - Continue current management with cardiology  Chronic Pain Post-Craniotomy Post-craniotomy in 2021 for complex vascular issue. No recurrence of pain. Continues  pregabalin; experienced anxiety when attempting to wean off. Surgeon advised continuation. - Continue pregabalin as prescribed  General Health Maintenance Blood counts normal. Engages in physical therapy twice a week and home exercises for knee issues. Mammogram and bone density scan were normal. - Continue physical therapy and home exercises - Schedule next mammogram and bone density scan per routine screening guidelines.     Plan -She is clinically doing well, exam was unremarkable, no clinical concern for recurrence -Continue adjuvant anastrozole -Follow-up with NP Lillia Abed in 6 months    SUMMARY OF ONCOLOGIC HISTORY: Oncology History  Malignant neoplasm of upper-outer quadrant of left breast in female, estrogen receptor positive (HCC)  10/26/2019 Initial Diagnosis   Malignant neoplasm of upper-outer quadrant of left breast in female, estrogen receptor positive (HCC)   11/01/2019 Cancer Staging   Staging form: Breast, AJCC 8th Edition - Clinical: Stage IA (cT1a, cN0, cM0, G2, ER+, PR+, HER2-) - Signed by Loa Socks, NP on 11/01/2019   11/03/2019 Genetic Testing   Negative genetic testing. No pathogenic variants identified on the [facility] Breast Cancer STAT Panel + Common Hereditary Cancers Panel. The report date is 11/03/2019.  The STAT Breast cancer panel offered by [facility] includes sequencing and rearrangement analysis for the following 9 genes:  ATM, BRCA1, BRCA2, CDH1, CHEK2, PALB2, PTEN, STK11 and TP53.    The Common Hereditary Cancers Panel offered by [facility] includes sequencing and/or deletion duplication testing of the following 48 genes: APC, ATM, AXIN2, BARD1, BMPR1A, BRCA1, BRCA2, BRIP1, CDH1, CDKN2A (p14ARF), CDKN2A (p16INK4a), CKD4, CHEK2, CTNNA1, DICER1, EPCAM (Deletion/duplication testing only), GREM1 (promoter region deletion/duplication testing only), KIT, MEN1, MLH1, MSH2, MSH3, MSH6, MUTYH, NBN, NF1, NHTL1, PALB2, PDGFRA, PMS2, POLD1, POLE, PTEN, RAD50,  RAD51C, RAD51D, RNF43, SDHB, SDHC, SDHD, SMAD4, SMARCA4. STK11, TP53, TSC1, TSC2, and  VHL.  The following genes were evaluated for sequence changes only: SDHA and HOXB13 c.251G>A variant only.   11/23/2019 Cancer Staging   Staging form: Breast, AJCC 8th Edition - Pathologic stage from 11/23/2019: Stage IA (pT1b, pN0, cM0, G2, ER+, PR+, HER2-) - Signed by Loa Socks, NP on 12/13/2019   11/23/2019 Surgery   status post left lumpectomy and sentinel lymph node sampling for a pT1b pN0, stage IA invasive lobular carcinoma, with negative margins    11/23/2019 Oncotype testing   18/5%   01/02/2020 - 01/24/2020 Radiation Therapy   Site Technique Total Dose (Gy) Dose per Fx (Gy) Completed Fx Beam Energies  Breast, Left: Breast_Lt 3D 42.56/42.56 2.66 16/16 10X, 15X     02/2020 -  Anti-estrogen oral therapy   Anastrozole      Discussed the use of AI scribe software for clinical note transcription with the patient, who gave verbal consent to proceed.  History of Present Illness   The patient, a 73 year old female with a history of breast cancer, atrial fibrillation, and a family history of colon cancer, presents for a routine follow-up. She has been on anastrozole for over three years, with only occasional hot flashes as a side effect. She also has regular cardiology follow-ups for her atrial fibrillation. She has a history of trigeminal neuralgia, for which she underwent a craniotomy in 2021, and currently takes pregabalin for residual pain. She also reports needing total knee replacements but has been unable to proceed due to her atrial fibrillation. She is currently attending physical therapy twice a week to manage her knee symptoms.         All other systems were reviewed with the patient and are negative.  MEDICAL HISTORY:  Past Medical History:  Diagnosis Date   A-fib [facility])    no issues since May 2020   Cancer [facility])    Diverticulosis    Elevated cholesterol    Hypertension     Obesity    Pancreatitis    Thyroid disease    Trigeminal neuralgia     SURGICAL HISTORY: Past Surgical History:  Procedure Laterality Date   ABDOMINAL HYSTERECTOMY  1980   BLADDER REPAIR     BREAST BIOPSY Left 11/2019   BREAST LUMPECTOMY Left 11/23/2019   BREAST LUMPECTOMY WITH RADIOACTIVE SEED AND SENTINEL LYMPH NODE BIOPSY Left 11/23/2019   Procedure: LEFT BREAST LUMPECTOMY WITH RADIOACTIVE SEED AND LEFT AXILLARY SENTINEL LYMPH NODE BIOPSY;  Surgeon: Emelia Loron, MD;  Location:  SURGERY CENTER;  Service: General;  Laterality: Left;  PEC BLOCK   [facility]   [facility]     [facility]   KNEE ARTHROSCOPY Left    TONSILLECTOMY      I have reviewed the social history and family history with the patient and they are unchanged from previous note.  ALLERGIES:  is allergic to ciprofloxacin, hydroxychloroquine, and topamax [topiramate].  MEDICATIONS:  Current Outpatient Medications  Medication Sig Dispense Refill   anastrozole (ARIMIDEX) 1 MG tablet Take 1 tablet (1 mg total) by mouth daily. 90 tablet 3   atorvastatin (LIPITOR) 20 MG tablet Take 20 mg by mouth daily.     celecoxib (CELEBREX) 200 MG capsule Take 200 mg by mouth as needed.     diltiazem (CARDIZEM CD) 240 MG 24 hr capsule Take 1 capsule (240 mg total) by mouth daily. 90 capsule 3   esomeprazole (NEXIUM) 40 MG capsule Take 40 mg by mouth 2 (two) times daily as needed.  levothyroxine (SYNTHROID) 137 MCG tablet Take 137 mcg by mouth daily. Current dose 137 mcg daily     LORazepam (ATIVAN) 0.5 MG tablet Take 0.5 mg by mouth as needed for anxiety.     olmesartan (BENICAR) 20 MG tablet Take 20 mg by mouth daily.     ondansetron (ZOFRAN-ODT) 8 MG disintegrating tablet PLACE 1 TABLET 3 TIMES A DAY BY TRANSLINGUAL ROUTE AS NEEDED FOR 30 DAYS.     pregabalin (LYRICA) 50 MG capsule Take 50 mg by mouth 2 (two) times daily.     rivaroxaban (XARELTO) 20 MG TABS tablet Take 20 mg by mouth daily.      sertraline (ZOLOFT) 50 MG tablet Take 50 mg by mouth daily.     sotalol (BETAPACE) 80 MG tablet Take 80 mg by mouth 2 (two) times daily.     Vitamin D, Ergocalciferol, (DRISDOL) 1.25 MG (50000 UNIT) CAPS capsule Take 50,000 Units by mouth once a week.     No current facility-administered medications for this visit.    PHYSICAL EXAMINATION: ECOG PERFORMANCE STATUS: 1 - Symptomatic but completely ambulatory  Vitals:   08/09/23 1059  BP: 124/72  Pulse: (!) 54  Resp: 18  Temp: (!) 97.3 F (36.3 C)  SpO2: 97%   Wt Readings from Last 3 Encounters:  08/09/23 252 lb 11.2 oz (114.6 kg)  08/04/23 252 lb 12.8 oz (114.7 kg)  05/04/23 251 lb (113.9 kg)     GENERAL:alert, no distress and comfortable SKIN: skin color, texture, turgor are normal, no rashes or significant lesions EYES: normal, Conjunctiva are pink and non-injected, sclera clear NECK: supple, thyroid normal size, non-tender, without nodularity LYMPH:  no palpable lymphadenopathy in the cervical, axillary  LUNGS: clear to auscultation and percussion with normal breathing effort HEART: regular rate & rhythm and no murmurs and no lower extremity edema ABDOMEN:abdomen soft, non-tender and normal bowel sounds Musculoskeletal:no cyanosis of digits and no clubbing  NEURO: alert & oriented x 3 with fluent speech, no focal motor/sensory deficits BREAST: Left breast incision around nipple, well-healed, no palpable mass or scar tissue. Right breast normal.  No axillary adenopathy.  LABORATORY DATA:  I have reviewed the data as listed    Latest Ref Rng & Units 08/09/2023   10:38 AM 02/03/2023   10:20 AM 12/23/2022   12:00 PM  CBC  WBC 4.0 - 10.5 K/uL 6.7  6.6  8.9   Hemoglobin 12.0 - 15.0 g/dL 16.1  09.6  04.5   Hematocrit 36.0 - 46.0 % 44.7  41.6  41.1   Platelets 150 - 400 K/uL 230  206  230.0         Latest Ref Rng & Units 08/09/2023   10:38 AM 08/04/2023   11:30 AM 02/03/2023   10:20 AM  CMP  Glucose 70 - 99 mg/dL 409  99   811   BUN 8 - 23 mg/dL 14  18  13    Creatinine 0.44 - 1.00 mg/dL 9.14  7.82  9.56   Sodium 135 - 145 mmol/L 139  139  140   Potassium 3.5 - 5.1 mmol/L 4.3  3.9  4.0   Chloride 98 - 111 mmol/L 104  102  104   CO2 22 - 32 mmol/L 30  23  30    Calcium 8.9 - 10.3 mg/dL 21.3  9.6  08.6   Total Protein 6.5 - 8.1 g/dL 7.0   6.6   Total Bilirubin 0.0 - 1.2 mg/dL 0.5   0.6   Alkaline  Phos 38 - 126 U/L 83   89   AST 15 - 41 U/L 11   13   ALT 0 - 44 U/L 19   21       RADIOGRAPHIC STUDIES: I have personally reviewed the radiological images as listed and agreed with the findings in the report. No results found.    No orders of the defined types were placed in this encounter.  All questions were answered. The patient knows to call the clinic with any problems, questions or concerns. No barriers to learning was detected. The total time spent in the appointment was 25 minutes.     Malachy Mood, MD 08/09/2023

## 2023-08-17 ENCOUNTER — Encounter: Payer: Self-pay | Admitting: Cardiology

## 2023-08-31 ENCOUNTER — Other Ambulatory Visit: Payer: Self-pay | Admitting: Cardiology

## 2023-10-12 ENCOUNTER — Other Ambulatory Visit: Payer: Self-pay | Admitting: Cardiology

## 2023-10-22 ENCOUNTER — Ambulatory Visit: Payer: Medicare Other | Admitting: Cardiology

## 2023-11-10 NOTE — Progress Notes (Signed)
 Cardiology Office Note:   Date:  11/11/2023  ID:  Novalei, Paoletti [birth year], MRN [id number] PCP: Jacey Eckerson Mcardle, MD  [facility] Providers Cardiologist:  None {  History of Present Illness:   Lisa Cardenas is a 73 y.o. female who presents for evaluation of atrial fibrillation.  She has previously been seen in [place] .  She has had paroxysmal atrial fibrillation since 2019.  She was treated with sotalol.  Echocardiogram previously has demonstrated well-preserved ejection fraction.  There is concern of elevated pulmonary pressures.  Most recent appears to be 2021.   I see an EKG from February 2024 which was atrial fibrillation with rapid rate.   Since I saw her she was seen in the atrial fib clinic and was doing relatively well on sotalol.  Unfortunately she is currently back in fibrillation and she has been for the last few days.  She does not feel well in this.  She feels weak and lightheaded.  Feels her heart racing.  She records her on her Apple watch.  She has not had any presyncope or syncope.  She has had no new chest discomfort, neck or arm discomfort.  ROS: As stated in the HPI and negative for all other systems.  Studies Reviewed:    EKG:   EKG Interpretation Date/Time:  Thursday Nov 11 2023 09:40:48 EDT Ventricular Rate:  94 PR Interval:    QRS Duration:  76 QT Interval:  358 QTC Calculation: 447 R Axis:   28  Text Interpretation: Atrial fibrillation Low voltage QRS Poor anterior R wave progression When compared with ECG of 04-Aug-2023 11:30, Atrial fibrillation has replaced Sinus rhythm Confirmed by Eilleen Grates ([id number]) on 11/11/2023 10:04:12 AM  Risk Assessment/Calculations:    CHA2DS2-VASc Score = 3   This indicates a 3.2% annual risk of stroke. The patient's score is based upon: CHF History: 0 HTN History: 1 Diabetes History: 0 Stroke History: 0 Vascular Disease History: 0 Age Score: 1 Gender Score: 1    Physical Exam:   VS:   BP 110/70   Pulse 94   Ht 5\' 7"  (1.702 m)   Wt 257 lb (116.6 kg)   SpO2 99%   BMI 40.25 kg/m    Wt Readings from Last 3 Encounters:  11/11/23 257 lb (116.6 kg)  08/09/23 252 lb 11.2 oz (114.6 kg)  08/04/23 252 lb 12.8 oz (114.7 kg)     GEN: Well nourished, well developed in no acute distress NECK: No JVD; No carotid bruits CARDIAC: Irregular RR, no murmurs, rubs, gallops RESPIRATORY:  Clear to auscultation without rales, wheezing or rhonchi  ABDOMEN: Soft, non-tender, non-distended EXTREMITIES:  No edema; No deformity   ASSESSMENT AND PLAN:   Hypertension:   Her blood pressure is at target.  No change in therapy.   Atrial fibrillation: She tolerates Xarelto .   She has been compliant with this and not missed any doses.  She is having symptomatic atrial fibrillation which I think is a failure of her sotalol.  She agrees to be admitted for Tikosyn. I think until we get her sleep apnea controlled and may be some weight loss I would not refer her right now for ablation.  She would prefer medical therapy.  I spoke with our atrial fibrillation clinic and we will arrange admission for next week.  She will stop the sotalol today.  She will continue the other meds.  I will check blood work to include magnesium  potassium CBC and TSH.  Sleep apnea:  She had severe sleep apnea on her home study.  She wanted to defer CPAP but now would consent to management.  Today I brought our sleep coordinators into the room and we had a discussion with the patient we will arrange to send her CPAP.  She consents to use this.       Follow up with me after the Tikosyn treatment.    Signed, Eilleen Grates, MD

## 2023-11-11 ENCOUNTER — Encounter (HOSPITAL_COMMUNITY): Payer: Self-pay

## 2023-11-11 ENCOUNTER — Encounter: Payer: Self-pay | Admitting: Cardiology

## 2023-11-11 ENCOUNTER — Ambulatory Visit: Payer: Medicare Other | Attending: Cardiology | Admitting: Cardiology

## 2023-11-11 VITALS — BP 110/70 | HR 94 | Ht 67.0 in | Wt 257.0 lb

## 2023-11-11 DIAGNOSIS — I10 Essential (primary) hypertension: Secondary | ICD-10-CM | POA: Diagnosis present

## 2023-11-11 DIAGNOSIS — I482 Chronic atrial fibrillation, unspecified: Secondary | ICD-10-CM | POA: Diagnosis not present

## 2023-11-11 LAB — CBC

## 2023-11-11 NOTE — Patient Instructions (Signed)
 Medication Instructions:  Your physician recommends that you continue on your current medications as directed. Please refer to the Current Medication list given to you today.    *If you need a refill on your cardiac medications before your next appointment, please call your pharmacy*   Lab Work: CBC CMET Magnesium   TSH    If you have labs (blood work) drawn today and your tests are completely normal, you will receive your results only by: MyChart Message (if you have MyChart) OR A paper copy in the mail If you have any lab test that is abnormal or we need to change your treatment, we will call you to review the results.   Testing/Procedures: NONE    Follow-Up: At [facility], you and your health needs are our priority.  As part of our continuing mission to provide you with exceptional heart care, we have created designated Provider Care Teams.  These Care Teams include your primary Cardiologist (physician) and Advanced Practice Providers (APPs -  Physician Assistants and Nurse Practitioners) who all work together to provide you with the care you need, when you need it.  We recommend signing up for the patient portal called "MyChart".  Sign up information is provided on this After Visit Summary.  MyChart is used to connect with patients for Virtual Visits (Telemedicine).  Patients are able to view lab/test results, encounter notes, upcoming appointments, etc.  Non-urgent messages can be sent to your provider as well.   To learn more about what you can do with MyChart, go to [email or web address].    Your next appointment:   6 week(s)  The format for your next appointment:   In Person  Provider:   Eilleen Grates, MD    Other Instructions

## 2023-11-12 ENCOUNTER — Other Ambulatory Visit: Payer: Self-pay

## 2023-11-12 ENCOUNTER — Encounter: Payer: Self-pay | Admitting: *Deleted

## 2023-11-12 ENCOUNTER — Telehealth (HOSPITAL_COMMUNITY): Payer: Self-pay

## 2023-11-12 ENCOUNTER — Telehealth: Payer: Self-pay | Admitting: Pharmacist

## 2023-11-12 ENCOUNTER — Other Ambulatory Visit (HOSPITAL_COMMUNITY): Payer: Self-pay

## 2023-11-12 ENCOUNTER — Encounter (HOSPITAL_COMMUNITY): Payer: Self-pay

## 2023-11-12 DIAGNOSIS — R0683 Snoring: Secondary | ICD-10-CM

## 2023-11-12 DIAGNOSIS — Z79899 Other long term (current) drug therapy: Secondary | ICD-10-CM

## 2023-11-12 DIAGNOSIS — I480 Paroxysmal atrial fibrillation: Secondary | ICD-10-CM

## 2023-11-12 DIAGNOSIS — G4733 Obstructive sleep apnea (adult) (pediatric): Secondary | ICD-10-CM

## 2023-11-12 DIAGNOSIS — I4891 Unspecified atrial fibrillation: Secondary | ICD-10-CM

## 2023-11-12 DIAGNOSIS — I10 Essential (primary) hypertension: Secondary | ICD-10-CM

## 2023-11-12 DIAGNOSIS — D6869 Other thrombophilia: Secondary | ICD-10-CM

## 2023-11-12 DIAGNOSIS — I482 Chronic atrial fibrillation, unspecified: Secondary | ICD-10-CM

## 2023-11-12 LAB — COMPREHENSIVE METABOLIC PANEL WITH GFR
ALT: 20 IU/L (ref 0–32)
AST: 13 IU/L (ref 0–40)
Albumin: 4.4 g/dL (ref 3.8–4.8)
Alkaline Phosphatase: 105 IU/L (ref 44–121)
BUN/Creatinine Ratio: 20 (ref 12–28)
BUN: 17 mg/dL (ref 8–27)
Bilirubin Total: 0.4 mg/dL (ref 0.0–1.2)
CO2: 22 mmol/L (ref 20–29)
Calcium: 10.1 mg/dL (ref 8.7–10.3)
Chloride: 104 mmol/L (ref 96–106)
Creatinine, Ser: 0.87 mg/dL (ref 0.57–1.00)
Globulin, Total: 2.3 g/dL (ref 1.5–4.5)
Glucose: 107 mg/dL — ABNORMAL HIGH (ref 70–99)
Potassium: 4.6 mmol/L (ref 3.5–5.2)
Sodium: 142 mmol/L (ref 134–144)
Total Protein: 6.7 g/dL (ref 6.0–8.5)
eGFR: 71 mL/min/{1.73_m2} (ref >59)

## 2023-11-12 LAB — MAGNESIUM: Magnesium: 2 mg/dL (ref 1.6–2.3)

## 2023-11-12 LAB — CBC
Hematocrit: 45.8 % (ref 34.0–46.6)
Hemoglobin: 14.9 g/dL (ref 11.1–15.9)
MCH: 27.2 pg (ref 26.6–33.0)
MCHC: 32.5 g/dL (ref 31.5–35.7)
MCV: 84 fL (ref 79–97)
Platelets: 250 10*3/uL (ref 150–450)
RBC: 5.47 x10E6/uL — ABNORMAL HIGH (ref 3.77–5.28)
RDW: 14.4 % (ref 11.7–15.4)
WBC: 8.3 10*3/uL (ref 3.4–10.8)

## 2023-11-12 LAB — TSH: TSH: 2.36 u[IU]/mL (ref 0.450–4.500)

## 2023-11-12 NOTE — Telephone Encounter (Signed)
 Medication list reviewed in anticipation of upcoming Tikosyn initiation. Patient is no contraindicated but two QTc prolonging medications.   Sertraline  may enhance the QTc prolonging effect of Tikosyn. Ideally she would be switched to a non- QTc prolonging antidepressant such as an SNRI. However, if patient is stable and switch cannot be made, then close monitoring of QTc is recommended.   Diltiazem  may increase the concentration of Tikosyn. No therapy changes are needed, monitor closely for toxicity.  Patient stopped Sotolol yesterday.  Patient is anticoagulated on Xarelto  on the appropriate dose. Please ensure that patient has not missed any anticoagulation doses in the 3 weeks prior to Tikosyn initiation.   Patient will need to be counseled to avoid use of Benadryl while on Tikosyn and in the 2-3 days prior to Tikosyn initiation.  Has not been on amiodarone.

## 2023-11-12 NOTE — Telephone Encounter (Signed)
 Reached out to [facility] Supplement Plan G does not require prior authorization for any medical needs. Call Reference#[id number] per [facility] call representative.

## 2023-11-16 ENCOUNTER — Encounter (HOSPITAL_COMMUNITY): Payer: Self-pay | Admitting: Cardiology

## 2023-11-16 ENCOUNTER — Ambulatory Visit (INDEPENDENT_AMBULATORY_CARE_PROVIDER_SITE_OTHER)
Admission: RE | Admit: 2023-11-16 | Discharge: 2023-11-16 | Disposition: A | Discharge status: Home / Self Care | Source: Ambulatory Visit | Attending: Physician Assistant | Admitting: Physician Assistant

## 2023-11-16 ENCOUNTER — Other Ambulatory Visit (HOSPITAL_COMMUNITY): Payer: Self-pay

## 2023-11-16 ENCOUNTER — Inpatient Hospital Stay (HOSPITAL_COMMUNITY)
Admission: RE | Admit: 2023-11-16 | Discharge: 2023-11-19 | DRG: 309 | Disposition: A | Discharge status: Home / Self Care | Source: Ambulatory Visit | Attending: Cardiology | Admitting: Cardiology

## 2023-11-16 ENCOUNTER — Encounter (HOSPITAL_COMMUNITY): Payer: Self-pay | Admitting: Physician Assistant

## 2023-11-16 ENCOUNTER — Other Ambulatory Visit: Payer: Self-pay

## 2023-11-16 VITALS — BP 116/70 | HR 132 | Ht 67.0 in | Wt 259.6 lb

## 2023-11-16 DIAGNOSIS — G4733 Obstructive sleep apnea (adult) (pediatric): Secondary | ICD-10-CM | POA: Diagnosis present

## 2023-11-16 DIAGNOSIS — E669 Obesity, unspecified: Secondary | ICD-10-CM | POA: Diagnosis present

## 2023-11-16 DIAGNOSIS — I484 Atypical atrial flutter: Principal | ICD-10-CM | POA: Diagnosis present

## 2023-11-16 DIAGNOSIS — Z853 Personal history of malignant neoplasm of breast: Secondary | ICD-10-CM | POA: Diagnosis not present

## 2023-11-16 DIAGNOSIS — Z6841 Body Mass Index (BMI) 40.0 and over, adult: Secondary | ICD-10-CM | POA: Diagnosis not present

## 2023-11-16 DIAGNOSIS — Z79811 Long term (current) use of aromatase inhibitors: Secondary | ICD-10-CM

## 2023-11-16 DIAGNOSIS — Z79890 Hormone replacement therapy (postmenopausal): Secondary | ICD-10-CM | POA: Diagnosis not present

## 2023-11-16 DIAGNOSIS — Z7901 Long term (current) use of anticoagulants: Secondary | ICD-10-CM

## 2023-11-16 DIAGNOSIS — I10 Essential (primary) hypertension: Secondary | ICD-10-CM | POA: Diagnosis present

## 2023-11-16 DIAGNOSIS — E7800 Pure hypercholesterolemia, unspecified: Secondary | ICD-10-CM | POA: Diagnosis present

## 2023-11-16 DIAGNOSIS — Z888 Allergy status to other drugs, medicaments and biological substances status: Secondary | ICD-10-CM | POA: Diagnosis not present

## 2023-11-16 DIAGNOSIS — I480 Paroxysmal atrial fibrillation: Secondary | ICD-10-CM | POA: Insufficient documentation

## 2023-11-16 DIAGNOSIS — D6869 Other thrombophilia: Secondary | ICD-10-CM | POA: Diagnosis not present

## 2023-11-16 DIAGNOSIS — Z79899 Other long term (current) drug therapy: Secondary | ICD-10-CM | POA: Diagnosis not present

## 2023-11-16 DIAGNOSIS — Z881 Allergy status to other antibiotic agents status: Secondary | ICD-10-CM

## 2023-11-16 DIAGNOSIS — I4819 Other persistent atrial fibrillation: Principal | ICD-10-CM | POA: Diagnosis present

## 2023-11-16 LAB — BASIC METABOLIC PANEL WITH GFR
Anion gap: 12 (ref 5–15)
BUN/Creatinine Ratio: 24 (ref 12–28)
BUN: 18 mg/dL (ref 8–27)
BUN: 20 mg/dL (ref 8–23)
CO2: 25 mmol/L (ref 20–29)
CO2: 22 mmol/L (ref 22–32)
Calcium: 9.8 mg/dL (ref 8.7–10.3)
Calcium: 9.4 mg/dL (ref 8.9–10.3)
Chloride: 104 mmol/L (ref 96–106)
Chloride: 105 mmol/L (ref 98–111)
Creatinine, Ser: 0.76 mg/dL (ref 0.57–1.00)
Creatinine, Ser: 0.75 mg/dL (ref 0.44–1.00)
GFR, Estimated: >60 mL/min (ref >60)
Glucose, Bld: 116 mg/dL — ABNORMAL HIGH (ref 70–99)
Glucose: 149 mg/dL — ABNORMAL HIGH (ref 70–99)
Potassium: 4.4 mmol/L (ref 3.5–5.2)
Potassium: 4 mmol/L (ref 3.5–5.1)
Sodium: 142 mmol/L (ref 134–144)
Sodium: 139 mmol/L (ref 135–145)
eGFR: 84 mL/min/{1.73_m2} (ref >59)

## 2023-11-16 LAB — MAGNESIUM
Magnesium: 1.9 mg/dL (ref 1.6–2.3)
Magnesium: 2 mg/dL (ref 1.7–2.4)

## 2023-11-16 MED ORDER — MAGNESIUM SULFATE 2 GM/50ML IV SOLN
2.0000 g | Freq: Once | INTRAVENOUS | Status: AC
  Administered 2023-11-16: 2 g via INTRAVENOUS
  Filled 2023-11-16: qty 50

## 2023-11-16 MED ORDER — CELECOXIB 200 MG PO CAPS: 200.0000 mg | ORAL_CAPSULE | ORAL | Status: DC | PRN

## 2023-11-16 MED ORDER — LEVOTHYROXINE SODIUM 25 MCG PO TABS
137.0000 ug | ORAL_TABLET | Freq: Every day | ORAL | Status: DC
  Administered 2023-11-17 – 2023-11-19 (×3): 137 ug via ORAL
  Filled 2023-11-16 (×3): qty 1

## 2023-11-16 MED ORDER — PREGABALIN 25 MG PO CAPS
50.0000 mg | ORAL_CAPSULE | Freq: Two times a day (BID) | ORAL | Status: DC
  Administered 2023-11-16 – 2023-11-19 (×6): 50 mg via ORAL
  Filled 2023-11-16 (×7): qty 2

## 2023-11-16 MED ORDER — RIVAROXABAN 20 MG PO TABS
20.0000 mg | ORAL_TABLET | Freq: Every day | ORAL | Status: DC
  Administered 2023-11-16 – 2023-11-18 (×3): 20 mg via ORAL
  Filled 2023-11-16 (×3): qty 1

## 2023-11-16 MED ORDER — ATORVASTATIN CALCIUM 10 MG PO TABS
20.0000 mg | ORAL_TABLET | Freq: Every day | ORAL | Status: DC
  Administered 2023-11-17 – 2023-11-19 (×3): 20 mg via ORAL
  Filled 2023-11-16 (×4): qty 2

## 2023-11-16 MED ORDER — SODIUM CHLORIDE 0.9 % IV SOLN: 250.0000 mL | INTRAVENOUS | Status: AC | PRN

## 2023-11-16 MED ORDER — DILTIAZEM HCL ER COATED BEADS 240 MG PO CP24
240.0000 mg | ORAL_CAPSULE | Freq: Every day | ORAL | Status: DC
  Administered 2023-11-17 – 2023-11-19 (×3): 240 mg via ORAL
  Filled 2023-11-16 (×4): qty 1

## 2023-11-16 MED ORDER — SODIUM CHLORIDE 0.9% FLUSH
3.0000 mL | Freq: Two times a day (BID) | INTRAVENOUS | Status: DC
  Administered 2023-11-16 – 2023-11-19 (×6): 3 mL via INTRAVENOUS

## 2023-11-16 MED ORDER — LORAZEPAM 0.5 MG PO TABS
0.5000 mg | ORAL_TABLET | ORAL | Status: DC | PRN
  Administered 2023-11-16 (×2): 0.5 mg via ORAL
  Filled 2023-11-16 (×3): qty 1

## 2023-11-16 MED ORDER — SODIUM CHLORIDE 0.9% FLUSH: 3.0000 mL | INTRAVENOUS | Status: DC | PRN

## 2023-11-16 MED ORDER — IRBESARTAN 150 MG PO TABS
150.0000 mg | ORAL_TABLET | Freq: Every day | ORAL | Status: DC
  Administered 2023-11-17 – 2023-11-19 (×3): 150 mg via ORAL
  Filled 2023-11-16 (×4): qty 1

## 2023-11-16 MED ORDER — ANASTROZOLE 1 MG PO TABS
1.0000 mg | ORAL_TABLET | Freq: Every day | ORAL | Status: DC
  Administered 2023-11-17 – 2023-11-19 (×3): 1 mg via ORAL
  Filled 2023-11-16 (×3): qty 1

## 2023-11-16 MED ORDER — DOFETILIDE 500 MCG PO CAPS
500.0000 ug | ORAL_CAPSULE | Freq: Two times a day (BID) | ORAL | Status: DC
  Administered 2023-11-16 – 2023-11-19 (×6): 500 ug via ORAL
  Filled 2023-11-16 (×6): qty 1

## 2023-11-16 MED ORDER — DILTIAZEM HCL 60 MG PO TABS
60.0000 mg | ORAL_TABLET | ORAL | Status: DC | PRN
  Administered 2023-11-16: 60 mg via ORAL
  Filled 2023-11-16: qty 1

## 2023-11-16 MED ORDER — SERTRALINE HCL 50 MG PO TABS
50.0000 mg | ORAL_TABLET | Freq: Every day | ORAL | Status: DC
  Administered 2023-11-17 – 2023-11-19 (×3): 50 mg via ORAL
  Filled 2023-11-16 (×4): qty 1

## 2023-11-16 MED ORDER — METOPROLOL TARTRATE 5 MG/5ML IV SOLN
2.5000 mg | Freq: Once | INTRAVENOUS | Status: AC
  Administered 2023-11-16: 2.5 mg via INTRAVENOUS
  Filled 2023-11-16: qty 5

## 2023-11-16 MED ORDER — PANTOPRAZOLE SODIUM 40 MG PO TBEC
80.0000 mg | DELAYED_RELEASE_TABLET | Freq: Two times a day (BID) | ORAL | Status: DC
  Administered 2023-11-17 – 2023-11-19 (×5): 80 mg via ORAL
  Filled 2023-11-16 (×5): qty 2

## 2023-11-16 NOTE — Progress Notes (Signed)
 Pharmacy: Dofetilide (Tikosyn) - Initial Consult Assessment and Electrolyte Replacement  Pharmacy consulted to assist in monitoring and replacing electrolytes in this 73 y.o. female admitted on 11/16/2023 undergoing dofetilide initiation. First dofetilide dose: 500 mcg planned 5/6 at 8 pm   Assessment:  Patient Exclusion Criteria: If any screening criteria checked as "Yes", then  patient  should NOT receive dofetilide until criteria item is corrected.  If "Yes" please indicate correction plan.  YES  NO Patient  Exclusion Criteria Correction Plan/Comments   []   [x]   Baseline QTc interval is greater than or equal to 440 msec. IF above YES box checked dofetilide contraindicated unless patient has ICD; then may proceed if QTc 500-550 msec or with known ventricular conduction abnormalities may proceed with QTc 550-600 msec. QTc =  385    []   [x]   Patient is known or suspected to have a digoxin level greater than 2 ng/ml: No results found for: "DIGOXIN"     []   [x]   Creatinine clearance less than 20 ml/min (calculated using Cockcroft-Gault, actual body weight and serum creatinine): Estimated Creatinine Clearance: 81.2 mL/min (by C-G formula based on SCr of 0.75 mg/dL).     []   [x]  Patient has received drugs known to prolong the QT intervals within the last 48 hour (examples: phenothiazines, tricyclics or tetracyclic antidepressants, macrolides, 1st generation H-1 antihistamines (especially diphenhydramine), fluoroquinolones, azoles, ondansetron , metoclopramide, promethazine).   Updated information on QT prolonging agents is available to be searched on the following database:QT prolonging agents -If SSRI or antihistamine needed, preferred options are sertraline  and loratadine respectively     []   [x]  Patient received a dose of a thiazide diuretic in the last 48 hours [including hydrochlorothiazide  (Oretic ) alone or in any combination including triamterene (Dyazide, Maxzide)].    []    [x]  Patient received a medication known to increase dofetilide plasma concentrations prior to initial dofetilide dose:  Trimethoprim (Primsol, Proloprim) in the last 36 hours Verapamil (Calan, Verelan) in the last 36 hours or a sustained release dose in the last 72 hours Megestrol (Megace) in the last 5 days  Cimetidine (Tagamet) in the last 6 hours Ketoconazole (Nizoral) in the last 24 hours Itraconazole (Sporanox) in the last 48 hours  Prochlorperazine (Compazine) in the last 36 hours     []   [x]   Patient is known to have a history of torsades de pointes; congenital or acquired long QT syndromes.    []   [x]   Patient has received a Class 1 and Class 3 antiarrhythmic with less than 2 half-lives since last dose. (Disopyramide, Quinidine, Procainamide, Lidocaine , Mexiletine, Flecainide, Propafenone, Sotalol, Dronedarone)    []   [x]   Patient has received amiodarone therapy in the past 3 months or amiodarone level is greater than 0.3 ng/ml.    Labs:    Component Value Date/Time   K 4.0 11/16/2023 1329   MG 2.0 11/16/2023 1329     Plan: Select One Calculated CrCl  Dose q12h  [x]  > 60 ml/min 500 mcg  []  40-60 ml/min 250 mcg  []  20-40 ml/min 125 mcg   [x]   Physician selected initial dose within range recommended for patients level of renal function - will monitor for response.  []   Physician selected initial dose outside of range recommended for patients level of renal function - will discuss if the dose should be altered at this time.   Patient has been appropriately anticoagulated with Xarelto .  Potassium: K >/= 4: Appropriate to initiate Tikosyn, no replacement needed  Magnesium : Mg 1.8-2: Give Mg 2 gm IV x1 to prevent Mg from dropping below 1.8 - do not need to recheck Mg. Appropriate to initiate Tikosyn   Thank you for allowing pharmacy to participate in this patient's care   Joanell Mowers, Davey Erp, [facility] Clinical Pharmacist  11/16/2023 2:33 PM   [facility]  pharmacy phone numbers are listed on [email or web address]

## 2023-11-16 NOTE — Progress Notes (Signed)
 Primary Care Physician: James Mcardle, MD Primary Cardiologist: Dr. Lavonne Prairie Primary Electrophysiologist: None Referring Physician: Dr. Reyna Cava Lisa Cardenas is a 73 y.o. female with a history of breast cancer, HLD, GERD, HTN, and atrial fibrillation who presents for consultation in the [facility] Fibrillation [facility].  The patient was initially diagnosed with atrial fibrillation in 2019 per record review. She has previously been seen in [place], [place], and treated with sotalol. She currently takes cardizem  240 mg daily and sotalol 80 mg BID. Patient is on Xarelto  20 mg daily for a CHADS2VASC score of 3.  On evaluation, she is in SR. Review of records show patient contacted cardiology office on 4/25 stating she was in Afib. She contacted again on 5/3 stating she was back in Afib again. She feels fatigued when in Afib and can feel increased heart rate. She has been on sotalol since ~October 2023. She uses her Apple Watch to help detect when she is in Afib. She has had about 3 or 4 episodes of Afib since January of this year.   She has stopped drinking caffeinated beverages. She does not drink alcohol. She thinks she might snore but not sure if stops breathing.   She is compliant with anticoagulation and has not missed any doses. She has no bleeding concerns.  On follow up 08/04/23, she is currently in NSR. Seen by Dr. Lavonne Prairie in October and patient noted ~monthly episodes of Afib that appear to have controlled rates. No missed doses of sotalol or Xarelto . She has noted that acute stress or too much caffeine can be her triggers for Afib.   Follow up 11/16/23. Patient returns for follow up for atrial fibrillation and dofetilide admission. She remains in rapid atrial flutter today. She has discontinued sotalol. No bleeding issues on anticoagulation.   Today, she  denies symptoms of chest pain, shortness of breath, orthopnea, PND, lower extremity edema, dizziness, presyncope,  syncope, bleeding, or neurologic sequela. The patient is tolerating medications without difficulties and is otherwise without complaint today.    Atrial Fibrillation Risk Factors:  she does have symptoms or diagnosis of sleep apnea. she does not have a history of rheumatic fever. she does not have a history of alcohol use. The patient does not have a history of early familial atrial fibrillation or other arrhythmias.   Atrial Fibrillation Management history:  Previous antiarrhythmic drugs: Sotalol Previous cardioversions: Unknown Previous ablations: None Anticoagulation history: Xarelto     Past Medical History:  Diagnosis Date   A-fib [facility])    no issues since May 2020   Cancer [facility])    Diverticulosis    Elevated cholesterol    Hypertension    Obesity    Pancreatitis    Thyroid disease    Trigeminal neuralgia     Current Outpatient Medications  Medication Sig Dispense Refill   anastrozole  (ARIMIDEX ) 1 MG tablet Take 1 tablet (1 mg total) by mouth daily. 90 tablet 3   atorvastatin (LIPITOR) 20 MG tablet Take 20 mg by mouth daily.     celecoxib  (CELEBREX ) 200 MG capsule Take 200 mg by mouth as needed.     diltiazem  (CARDIZEM  CD) 240 MG 24 hr capsule TAKE 1 CAPSULE BY MOUTH EVERY DAY 90 capsule 3   diltiazem  (CARDIZEM ) 60 MG tablet Take 60 mg by mouth as needed. Prescribed by PCP. Takes for breakthrough AFib     esomeprazole  (NEXIUM ) 40 MG capsule Take 40 mg by mouth 2 (two) times daily as needed.  levothyroxine  (SYNTHROID ) 137 MCG tablet Take 137 mcg by mouth daily. Current dose 137 mcg daily     LORazepam  (ATIVAN ) 0.5 MG tablet Take 0.5 mg by mouth as needed for anxiety.     olmesartan  (BENICAR ) 20 MG tablet Take 20 mg by mouth daily.     pregabalin  (LYRICA ) 50 MG capsule Take 50 mg by mouth 2 (two) times daily.     rivaroxaban  (XARELTO ) 20 MG TABS tablet Take 20 mg by mouth daily.     sertraline  (ZOLOFT ) 50 MG tablet Take 50 mg by mouth daily.     Vitamin D ,  Ergocalciferol , (DRISDOL ) 1.25 MG (50000 UNIT) CAPS capsule Take 50,000 Units by mouth once a week.     No current facility-administered medications for this encounter.    Allergies  Allergen Reactions   Ciprofloxacin  Other (See Comments)   Hydroxychloroquine     Increased heart rate  Other Reaction(s): Not available   Topamax [Topiramate]     Other Reaction(s): other   ROS- All systems are reviewed and negative except as per the HPI above.  Physical Exam: Vitals:   11/16/23 0857  BP: 116/70  Pulse: (!) 132  Weight: 117.8 kg  Height: 5\' 7"  (1.702 m)     GEN: Well nourished, well developed in no acute distress CARDIAC: Irregularly irregular rate and rhythm, no murmurs, rubs, gallops RESPIRATORY:  Clear to auscultation without rales, wheezing or rhonchi  ABDOMEN: Soft, non-tender, non-distended EXTREMITIES:  No edema; No deformity    Wt Readings from Last 3 Encounters:  11/16/23 117.8 kg  11/11/23 116.6 kg  08/09/23 114.6 kg    EKG today demonstrates  Atypical atrial flutter with variable block Vent. rate 132 BPM PR interval * ms QRS duration 66 ms QT/QTcB 260/385 ms   Echo 07/08/2017 outside study demonstrated: Normal EF Mild concentric LVH  Epic records are reviewed at length today.   CHA2DS2-VASc Score = 3  The patient's score is based upon: CHF History: 0 HTN History: 1 Diabetes History: 0 Stroke History: 0 Vascular Disease History: 0 Age Score: 1 Gender Score: 1       ASSESSMENT AND PLAN: Paroxysmal Atrial Fibrillation/atypical atrial flutter The patient's CHA2DS2-VASc score is 3, indicating a 3.2% annual risk of stroke.   Patient presents for dofetilide admission She is in atypical atrial flutter Continue Xarelto  20 mg daily, states no missed doses in the last 3 weeks. No recent benadryl use PharmD has screened medications for QT prolonging agents. She discontinued sotalol 5/1. QTc in SR 433 ms Labs today show creatinine at 0.76, K+ 4.4  and mag 1.9, CrCl calculated at 126 mL/min Continue diltiazem  240 mg daily with 60 mg PRN for heart racing  Secondary Hypercoagulable State (ICD10:  D68.69) The patient is at significant risk for stroke/thromboembolism based upon her CHA2DS2-VASc Score of 3.  Continue Rivaroxaban  (Xarelto ). No bleeding issues.   Obesity Body mass index is 40.66 kg/m.  Encouraged lifestyle modification  OSA  Severe sleep apnea on study 05/2023 Not yet on CPAP  HTN Stable on current regimen   To be admitted later today once a bed becomes available.     Myrtha Ates PA-C Afib Clinic [facility] [address] [place], [place] [address] [phone] 11/16/2023 9:07 AM

## 2023-11-16 NOTE — H&P (Addendum)
 Primary Care Physician: James Mcardle, MD Primary Cardiologist: Dr. Lavonne Prairie Primary Electrophysiologist: None Referring Physician: Dr. Reyna Cava Lisa Cardenas is a 73 y.o. female with a history of breast cancer, HLD, GERD, HTN, and atrial fibrillation who presents for consultation in the [facility] Atrial Fibrillation [facility].  The patient was initially diagnosed with atrial fibrillation in 2019 per record review. She has previously been seen in [place], [place], and treated with sotalol. She currently takes cardizem  240 mg daily and sotalol 80 mg BID. Patient is on Xarelto  20 mg daily for a CHADS2VASC score of 3.  On evaluation, she is in SR. Review of records show patient contacted cardiology office on 4/25 stating she was in Afib. She contacted again on 5/3 stating she was back in Afib again. She feels fatigued when in Afib and can feel increased heart rate. She has been on sotalol since ~October 2023. She uses her Apple Watch to help detect when she is in Afib. She has had about 3 or 4 episodes of Afib since January of this year.   She has stopped drinking caffeinated beverages. She does not drink alcohol. She thinks she might snore but not sure if stops breathing.   She is compliant with anticoagulation and has not missed any doses. She has no bleeding concerns.  On follow up 08/04/23, she is currently in NSR. Seen by Dr. Lavonne Prairie in October and patient noted ~monthly episodes of Afib that appear to have controlled rates. No missed doses of sotalol or Xarelto . She has noted that acute stress or too much caffeine can be her triggers for Afib.   Follow up 11/16/23. Patient returns for follow up for atrial fibrillation and dofetilide admission. She remains in rapid atrial flutter today. She has discontinued sotalol. No bleeding issues on anticoagulation.   Today, she  denies symptoms of chest pain, shortness of breath, orthopnea, PND, lower extremity edema, dizziness, presyncope,  syncope, bleeding, or neurologic sequela. The patient is tolerating medications without difficulties and is otherwise without complaint today.    Atrial Fibrillation Risk Factors:  she does have symptoms or diagnosis of sleep apnea. she does not have a history of rheumatic fever. she does not have a history of alcohol use. The patient does not have a history of early familial atrial fibrillation or other arrhythmias.   Atrial Fibrillation Management history:  Previous antiarrhythmic drugs: Sotalol Previous cardioversions: Unknown Previous ablations: None Anticoagulation history: Xarelto     Past Medical History:  Diagnosis Date   A-fib [facility])    no issues since May 2020   Cancer [facility])    Diverticulosis    Elevated cholesterol    Hypertension    Obesity    Pancreatitis    Thyroid disease    Trigeminal neuralgia     Current Facility-Administered Medications  Medication Dose Route Frequency Provider Last Rate Last Admin   0.9 %  sodium chloride  infusion  250 mL Intravenous PRN Lisa Cardenas, Lisa R, PA       [START ON 11/17/2023] anastrozole  (ARIMIDEX ) tablet 1 mg  1 mg Oral Daily Lisa Cardenas, Lisa R, PA       [START ON 11/17/2023] atorvastatin (LIPITOR) tablet 20 mg  20 mg Oral Daily Lisa Cardenas, Lisa R, PA       celecoxib  (CELEBREX ) capsule 200 mg  200 mg Oral PRN Lisa Cardenas, Lisa R, PA       [START ON 11/17/2023] diltiazem  (CARDIZEM  CD) 24 hr capsule 240 mg  240 mg Oral Daily Lisa Cardenas, Lisa  R, PA       diltiazem  (CARDIZEM ) tablet 60 mg  60 mg Oral Q4H PRN Lisa Cardenas, Lisa R, PA   60 mg at 11/16/23 1250   dofetilide (TIKOSYN) capsule 500 mcg  500 mcg Oral BID Lisa Cardenas, Lisa R, PA       [START ON 11/17/2023] irbesartan (AVAPRO) tablet 150 mg  150 mg Oral Daily Lisa Cardenas, Lisa R, PA       [START ON 11/17/2023] levothyroxine  (SYNTHROID ) tablet 137 mcg  137 mcg Oral Q0600 Lisa Cardenas, Lisa R, PA       LORazepam  (ATIVAN ) tablet 0.5 mg  0.5 mg Oral PRN Lisa Cardenas, Lisa R, PA   0.5 mg at 11/16/23 1250   magnesium  sulfate  IVPB 2 g 50 mL  2 g Intravenous Once Lisa Cardenas, Lisa Cardenas, Lisa Cardenas       metoprolol  tartrate (LOPRESSOR ) injection 2.5 mg  2.5 mg Intravenous Once Lisa Cardenas, Lisa L, NP       [START ON 11/17/2023] pantoprazole (PROTONIX) EC tablet 80 mg  80 mg Oral BID AC Lisa Cardenas, Lisa R, PA       pregabalin  (LYRICA ) capsule 50 mg  50 mg Oral BID Lisa Cardenas, Lisa R, PA       rivaroxaban  (XARELTO ) tablet 20 mg  20 mg Oral Q supper Lisa Cardenas, Lisa R, PA       [START ON 11/17/2023] sertraline  (ZOLOFT ) tablet 50 mg  50 mg Oral Daily Lisa Cardenas, Lisa R, PA       sodium chloride  flush (NS) 0.9 % injection 3 mL  3 mL Intravenous Q12H Lisa Cardenas, Lisa R, PA   3 mL at 11/16/23 1209   sodium chloride  flush (NS) 0.9 % injection 3 mL  3 mL Intravenous PRN Lisa Cardenas, Lisa R, PA        Allergies  Allergen Reactions   Ciprofloxacin  Other (See Comments)   Hydroxychloroquine     Increased heart rate  Other Reaction(s): Not available   Topamax [Topiramate]     Other Reaction(s): other   ROS- All systems are reviewed and negative except as per the HPI above.  Physical Exam: Vitals:   11/16/23 1132 11/16/23 1137  BP:  125/79  Pulse:  (!) 124  Resp:  18  Temp:  97.7 F (36.5 C)  TempSrc:  Oral  SpO2:  92%  Weight: 117.3 kg   Height: 5\' 4"  (1.626 m)      GEN: Well nourished, well developed in no acute distress CARDIAC: Irregularly irregular rate and rhythm, no murmurs, rubs, gallops RESPIRATORY:  Clear to auscultation without rales, wheezing or rhonchi  ABDOMEN: Soft, non-tender, non-distended EXTREMITIES:  No edema; No deformity    Wt Readings from Last 3 Encounters:  11/16/23 117.3 kg  11/16/23 117.8 kg  11/11/23 116.6 kg    EKG today demonstrates  Atypical atrial flutter with variable block Vent. rate 132 BPM PR interval * ms QRS duration 66 ms QT/QTcB 260/385 ms   Echo 07/08/2017 outside study demonstrated: Normal EF Mild concentric LVH  Epic records are reviewed at length today.   CHA2DS2-VASc Score = 3  The  patient's score is based upon: CHF History: 0 HTN History: 1 Diabetes History: 0 Stroke History: 0 Vascular Disease History: 0 Age Score: 1 Gender Score: 1       ASSESSMENT AND PLAN: Paroxysmal Atrial Fibrillation/atypical atrial flutter The patient's CHA2DS2-VASc score is 3, indicating a 3.2% annual risk of stroke.   Patient presents for dofetilide admission She is in atypical atrial flutter Continue Xarelto  20 mg  daily, states no missed doses in the last 3 weeks. No recent benadryl use PharmD has screened medications for QT prolonging agents. She discontinued sotalol 5/1. QTc in SR 433 ms Labs today show creatinine at 0.76, K+ 4.4 and mag 1.9, CrCl calculated at 126 mL/min Continue diltiazem  240 mg daily with 60 mg PRN for heart racing  Secondary Hypercoagulable State (ICD10:  D68.69) The patient is at significant risk for stroke/thromboembolism based upon her CHA2DS2-VASc Score of 3.  Continue Rivaroxaban  (Xarelto ). No bleeding issues.   Obesity Body mass index is 44.39 kg/m.  Encouraged lifestyle modification  OSA  Severe sleep apnea on study 05/2023 Not yet on CPAP  HTN Stable on current regimen   To be admitted later today once a bed becomes available.     Lisa Ates PA-C Afib Clinic [facility] [address] 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    Hx of AF that began in 2019.  She was initially followed in [place], [place] and was started on Sotalol in 2022 or 2023.  She monitors with an Scientist, physiological. She subsequently transferred her care to Dr. Lavonne Prairie.  On Sotalol, she would have periods of stability/NSR mixed with intermittent periods of AF.  She was seen in the AF Clinic for Tikosyn consideration.  Sotalol was  stopped on 11/11/23. She has not missed doses of Xarelto  (Hgb 14.9). QTc in NSR 433 ms.     Admit K+ 4, Mg 2.0.  Cr 0.75 / Cr Cl of 127 mL/min.   Medications reviewed with pharmacy > Carolynn Tuley proceed with caution / monitor while on zoloft  as may prolong QTc. Anticipate pt Sheletha Bow qualify for 500 mcg BID dosing of Tikosyn. Continue diltiazem  for now. 1x dose 2.5 mg IV of lopressor  for AFwRVR.    Creighton Doffing, NP-C, AGACNP-BC [facility] - Electrophysiology  11/16/2023, 2:41 PM  I have seen and examined this patient with Creighton Doffing.  Agree with above, note added to reflect my findings.  Patient admitted to the hospital for dofetilide load.  She was previously on sotalol for many years, but is unfortunately had more episodes of atrial fibrillation.  She feels weak and fatigued in atrial fibrillation.  No other acute complaints.  GEN: No acute distress.   Neck: No JVD Cardiac: Tachycardic, irregular Respiratory: normal BS bases bilaterally. GI: Soft, nontender, non-distended  MS: No edema; No deformity. Neuro:  Nonfocal  Skin: warm and dry Psych: Normal affect    Persistent atrial fibrillation/atypical atrial flutter: Patient admitted to the hospital for dofetilide load.  In atrial fibrillation currently.  Kristian Hazzard start dofetilide tonight.  Potassium magnesium  within normal limits.  If she does not convert to sinus rhythm, Aila Terra plan for cardioversion Thursday. Secondary hypercoagulable state: On Xarelto  for atrial fibrillation Obesity: Lifestyle modification encouraged Obstructive sleep apnea: Severe.  Gennell How need CPAP in the future. Hypertension: Continue current regimen  Negan Grudzien M. Eleshia Wooley MD 11/16/2023 3:38 PM

## 2023-11-17 ENCOUNTER — Encounter (HOSPITAL_COMMUNITY): Payer: Self-pay

## 2023-11-17 ENCOUNTER — Other Ambulatory Visit: Payer: Self-pay

## 2023-11-17 LAB — BASIC METABOLIC PANEL WITH GFR
Anion gap: 8 (ref 5–15)
BUN: 13 mg/dL (ref 8–23)
CO2: 26 mmol/L (ref 22–32)
Calcium: 9.1 mg/dL (ref 8.9–10.3)
Chloride: 107 mmol/L (ref 98–111)
Creatinine, Ser: 0.69 mg/dL (ref 0.44–1.00)
GFR, Estimated: >60 mL/min (ref >60)
Glucose, Bld: 109 mg/dL — ABNORMAL HIGH (ref 70–99)
Potassium: 4 mmol/L (ref 3.5–5.1)
Sodium: 141 mmol/L (ref 135–145)

## 2023-11-17 LAB — MAGNESIUM: Magnesium: 2.3 mg/dL (ref 1.7–2.4)

## 2023-11-17 MED ORDER — LORAZEPAM 0.5 MG PO TABS
0.5000 mg | ORAL_TABLET | Freq: Two times a day (BID) | ORAL | Status: DC | PRN
Start: 2023-11-17 — End: 2023-11-19
  Administered 2023-11-17 – 2023-11-18 (×2): 0.5 mg via ORAL
  Filled 2023-11-17 (×2): qty 1

## 2023-11-17 MED ORDER — ACETAMINOPHEN 325 MG PO TABS
650.0000 mg | ORAL_TABLET | Freq: Four times a day (QID) | ORAL | Status: DC | PRN
  Administered 2023-11-17 – 2023-11-19 (×4): 650 mg via ORAL
  Filled 2023-11-17 (×4): qty 2

## 2023-11-17 MED ORDER — DOCUSATE SODIUM 100 MG PO CAPS
100.0000 mg | ORAL_CAPSULE | Freq: Two times a day (BID) | ORAL | Status: DC | PRN
  Filled 2023-11-17 (×2): qty 1

## 2023-11-17 NOTE — Progress Notes (Signed)
 Pharmacy: Dofetilide (Tikosyn) - Follow Up Assessment and Electrolyte Replacement  Pharmacy consulted to assist in monitoring and replacing electrolytes in this 73 y.o. female admitted on 11/16/2023 undergoing dofetilide initiation. First dofetilide dose: dofetilide 500 mcg  Labs:    Component Value Date/Time   K 4.0 11/17/2023 0402   MG 2.3 11/17/2023 0402     Plan: Potassium: K >/= 4: No additional supplementation needed  Magnesium : Mg > 2: No additional supplementation needed   Thank you for allowing pharmacy to participate in this patient's care   Cheryll Corti, [facility] Clinical Pharmacist  11/17/2023 1:30 PM   [facility] pharmacy phone numbers are listed on [email or web address]

## 2023-11-17 NOTE — Progress Notes (Signed)
 Morning EKG reviewed     Shows has converted to NSR with stable QTc at 470 ms.  Continue  Tikosyn 500 mcg BID.   Potassium4.0 (05/07 0402) Magnesium   2.3 (05/07 0402) Creatinine, ser  0.69 (05/07 0402)  Pt will not require DCCV (if remains in NSR)    Creighton Doffing, NP-C, AGACNP-BC [facility] - Electrophysiology  11/17/2023, 3:23 PM

## 2023-11-17 NOTE — TOC CM/SW Note (Signed)
 Transition of Care [facility]) - Inpatient Brief Assessment   Patient Details  Name: Lisa Cardenas MRN: [id number] Date of Birth: June 28, 1951  Transition of Care [facility]) CM/SW Contact:    Cosimo Diones, RN Phone Number: 11/17/2023, 4:45 PM   Clinical Narrative: Patient presented for Tikosyn Load. Case Manager spoke with the patient regarding co pay cost. Patient is not agreeable to the cost and would like to use [facility]. Patient wants the initial Rx filled via [facility] and the Rx refills 90 day supply escribed to [facility] [address] [place] . Per patient, please write on the Rx do not fill via Insurance- patient wants to use Good [facility]. No further needs identified at this time.  Transition of Care Asessment: Insurance and Status: Insurance coverage has been reviewed Patient has primary care physician: Yes Home environment has been reviewed: reviewed Prior level of function:: independent with DME cane Prior/Current Home Services: No current home services Social Drivers of Health Review: SDOH reviewed no interventions necessary Readmission risk has been reviewed: Yes Transition of care needs: no transition of care needs at this time

## 2023-11-17 NOTE — Progress Notes (Addendum)
 Electrophysiology Rounding Note  Patient Name: Lisa Cardenas Date of Encounter: 11/17/2023  Primary Cardiologist: Eilleen Grates, MD  Electrophysiologist: Dr. Lawana Pray, new consult 11/16/23   Subjective   Pt remains in afib on Tikosyn 500 mcg BID   QTc from EKG last pm shows stable QTc at 460 ms in lead II  The patient is doing well today.  At this time, the patient denies chest pain, shortness of breath, or any new concerns.  Inpatient Medications    Scheduled Meds:  anastrozole   1 mg Oral Daily   atorvastatin  20 mg Oral Daily   diltiazem   240 mg Oral Daily   dofetilide  500 mcg Oral BID   irbesartan  150 mg Oral Daily   levothyroxine   137 mcg Oral Q0600   pantoprazole  80 mg Oral BID AC   pregabalin   50 mg Oral BID   rivaroxaban   20 mg Oral Q supper   sertraline   50 mg Oral Daily   sodium chloride  flush  3 mL Intravenous Q12H   Continuous Infusions:  sodium chloride      PRN Meds: sodium chloride , celecoxib , diltiazem , LORazepam , sodium chloride  flush   Vital Signs    Vitals:   11/16/23 1549 11/16/23 2035 11/16/23 2340 11/17/23 0405  BP: 109/75 (!) 112/50 (!) 101/49 (!) 113/56  Pulse: 84 (!) 110 69 66  Resp: 18 19 20 18   Temp: 97.7 F (36.5 C) 98.1 F (36.7 C) 98.1 F (36.7 C) 98.2 F (36.8 C)  TempSrc: Oral Oral Oral Oral  SpO2: 90%     Weight:      Height:        Intake/Output Summary (Last 24 hours) at 11/17/2023 0652 Last data filed at 11/16/2023 1551 Gross per 24 hour  Intake 360 ml  Output --  Net 360 ml   Filed Weights   11/16/23 1132  Weight: 117.3 kg    Physical Exam    GEN- NAD, A&O x 3. Normal affect.  Lungs- CTAB, Normal effort.  Heart- Regular rate and rhythm. No M/G/R GI- Soft, NT, ND Extremities- No clubbing, cyanosis, or edema Skin- no rash or lesion  Labs    CBC No results for input(s): "WBC", "NEUTROABS", "HGB", "HCT", "MCV", "PLT" in the last 72 hours.  Basic Metabolic Panel Recent Labs    16/10/96 1329  11/17/23 0402  NA 139 141  K 4.0 4.0  CL 105 107  CO2 22 26  GLUCOSE 116* 109*  BUN 20 13  CREATININE 0.75 0.69  CALCIUM 9.4 9.1  MG 2.0 2.3    Telemetry    AF 80-140 bpm, converted to NSR ~2200 overnight (personally reviewed)  Patient Profile     Lisa Cardenas is a 73 y.o. female with a PMH of persistent atrial fibrillation, severe OSA (not yet on CPAP), HTN.They were admitted for tikosyn load / high risk drug monitoring.  QTc in NSR prior to admit 433 ms.   Assessment & Plan    Persistent Atrial Fibrillation Pt remains in afib on Tikosyn 500 mcg BID  Continue Xarelto  Creatinine, ser  0.69 (05/07 0402) Magnesium   2.3 (05/07 0402) Potassium4.0 (05/07 0402) No electrolyte supplementation needed  If pt does not convert chemically, plan on DCCV Thursday    For questions or updates, please contact [facility] Please consult [email or web address] for contact info under Cardiology/STEMI.  Signed, Lisa Doffing, NP-C, AGACNP-BC [facility] - Electrophysiology  11/17/2023, 6:57 AM  I have seen and examined this patient with  Lisa Cardenas.  Agree with above, note added to reflect my findings.  Patient feeling well.  Converted to sinus rhythm overnight last night.  No acute complaints.  GEN: No acute distress.   Neck: No JVD Cardiac: RRR, no murmurs, rubs, or gallops.  Respiratory: normal BS bases bilaterally. GI: Soft, nontender, non-distended  MS: No edema; No deformity. Neuro:  Nonfocal  Skin: warm and dry Psych: Normal affect    Persistent atrial fibrillation: Converted to sinus rhythm on dofetilide.  QTc remained stable.  Patient is without acute complaint at this time.  Potassium and magnesium  within normal limits.  Continue with current management. Secondary hypercoagulable state: Continue anticoagulation High risk medication monitoring: QTc stable on dofetilide Hypertension: Continue home medications  Lisa Cribb M. Olga Bourbeau MD 11/17/2023 10:27 AM

## 2023-11-17 NOTE — Progress Notes (Signed)
 Mobility Specialist Progress Note;   11/17/23 1150  Mobility  Activity Ambulated with assistance in hallway  Level of Assistance Standby assist, set-up cues, supervision of patient - no hands on  Assistive Device Cane  Distance Ambulated (ft) 400 ft  Activity Response Tolerated well  Mobility Referral Yes  Mobility visit 1 Mobility  Mobility Specialist Start Time (ACUTE ONLY) 1150  Mobility Specialist Stop Time (ACUTE ONLY) 1156  Mobility Specialist Time Calculation (min) (ACUTE ONLY) 6 min   Pt eager for mobility. Required no physical assistance during ambulation, SV. HR up to 121 w/ activity. Pt c/o feeling slight Sob towards EOS. Pt returned safely back to chair with all needs met.   Janit Meline Mobility Specialist Please contact via SecureChat or [employer] [phone]

## 2023-11-18 ENCOUNTER — Encounter (HOSPITAL_COMMUNITY)
Admission: RE | Disposition: A | Discharge status: Home / Self Care | Payer: Self-pay | Source: Ambulatory Visit | Attending: Cardiology

## 2023-11-18 LAB — MAGNESIUM: Magnesium: 2.1 mg/dL (ref 1.7–2.4)

## 2023-11-18 LAB — BASIC METABOLIC PANEL WITH GFR
Anion gap: 6 (ref 5–15)
BUN: 15 mg/dL (ref 8–23)
CO2: 28 mmol/L (ref 22–32)
Calcium: 9.4 mg/dL (ref 8.9–10.3)
Chloride: 106 mmol/L (ref 98–111)
Creatinine, Ser: 0.81 mg/dL (ref 0.44–1.00)
GFR, Estimated: >60 mL/min (ref >60)
Glucose, Bld: 119 mg/dL — ABNORMAL HIGH (ref 70–99)
Potassium: 4.1 mmol/L (ref 3.5–5.1)
Sodium: 140 mmol/L (ref 135–145)

## 2023-11-18 SURGERY — CARDIOVERSION (CATH LAB)
Anesthesia: General

## 2023-11-18 NOTE — Progress Notes (Signed)
 Mobility Specialist Progress Note;   11/18/23 1039  Mobility  Activity Ambulated with assistance in hallway  Level of Assistance Standby assist, set-up cues, supervision of patient - no hands on  Assistive Device Cane  Distance Ambulated (ft) 400 ft  Activity Response Tolerated well  Mobility Referral Yes  Mobility visit 1 Mobility  Mobility Specialist Start Time (ACUTE ONLY) 1039  Mobility Specialist Stop Time (ACUTE ONLY) 1049  Mobility Specialist Time Calculation (min) (ACUTE ONLY) 10 min    Pre-mobility: HR 66 bpm  Pt eager for mobility. Required no physical assistance during ambulation, SV. HR up to 92 bpm w/ activity. Monitor displayed a short bout of Tachy once returned back to chair, HR 147 bpm. However quickly recovered back down to 70s. No c/o from pt. Pt left in chair with all needs met. NT in room.   Janit Meline Mobility Specialist Please contact via SecureChat or [employer] [phone]

## 2023-11-18 NOTE — Progress Notes (Signed)
 Pharmacy: Dofetilide (Tikosyn) - Follow Up Assessment and Electrolyte Replacement  Pharmacy consulted to assist in monitoring and replacing electrolytes in this 73 y.o. female admitted on 11/16/2023 undergoing dofetilide initiation. First dofetilide dose: 500 mcg started 5/6  Labs:    Component Value Date/Time   K 4.1 11/18/2023 0455   MG 2.1 11/18/2023 0455     Plan: Potassium: K >/= 4: No additional supplementation needed  Magnesium : Mg > 2: No additional supplementation needed   As patient has required on average 0 mEq of potassium replacement every day, recommend discharging patient without a prescription for K.  Thank you for allowing pharmacy to participate in this patient's care   Joanell Mowers, Davey Erp, [facility] Clinical Pharmacist  11/18/2023 11:51 AM   [facility] pharmacy phone numbers are listed on [email or web address]

## 2023-11-18 NOTE — Progress Notes (Signed)
 Morning EKG reviewed     Shows remains in NSR with stable QTc at 454 ms (V3)  Continue  Tikosyn 500 mcg BID.   Potassium4.1 (05/08 0455) Magnesium   2.1 (05/08 0455) Creatinine, ser  0.81 (05/08 0455)  Plan for home Friday if QTc remains stable     Creighton Doffing, NP-C, AGACNP-BC [facility] - Electrophysiology  11/18/2023, 1:58 PM

## 2023-11-18 NOTE — Progress Notes (Addendum)
 Electrophysiology Rounding Note  Patient Name: Lisa Cardenas Date of Encounter: 11/18/2023  Primary Cardiologist: Eilleen Grates, MD  Electrophysiologist: None    Subjective   Pt remains in NSR on Tikosyn 500 mcg BID   QTc from EKG last pm shows stable QTc at 459 ms (V5)  The patient is doing well today.  At this time, the patient denies chest pain, shortness of breath, or any new concerns.  Inpatient Medications    Scheduled Meds:  anastrozole   1 mg Oral Daily   atorvastatin  20 mg Oral Daily   diltiazem   240 mg Oral Daily   dofetilide  500 mcg Oral BID   irbesartan  150 mg Oral Daily   levothyroxine   137 mcg Oral Q0600   pantoprazole  80 mg Oral BID AC   pregabalin   50 mg Oral BID   rivaroxaban   20 mg Oral Q supper   sertraline   50 mg Oral Daily   sodium chloride  flush  3 mL Intravenous Q12H   Continuous Infusions:  PRN Meds: acetaminophen , celecoxib , diltiazem , docusate sodium, LORazepam , sodium chloride  flush   Vital Signs    Vitals:   11/17/23 0806 11/17/23 1300 11/17/23 1745 11/17/23 2300  BP: 124/74 114/62 130/68 (!) 140/65  Pulse: 67 72 78 70  Resp: 16 20 16 18   Temp: 98.9 F (37.2 C) 98.5 F (36.9 C) 98.3 F (36.8 C) 98.5 F (36.9 C)  TempSrc: Oral Oral Oral Oral  SpO2: 96% 92% 96% 96%  Weight:      Height:        Intake/Output Summary (Last 24 hours) at 11/18/2023 0642 Last data filed at 11/17/2023 2225 Gross per 24 hour  Intake 1080 ml  Output --  Net 1080 ml   Filed Weights   11/16/23 1132  Weight: 117.3 kg    Physical Exam    GEN- NAD, A&O x 3. Normal affect.  Lungs- CTAB, Normal effort.  Heart- Regular rate and rhythm. No M/G/R GI- Soft, NT, ND Extremities- No clubbing, cyanosis, or edema Skin- no rash or lesion  Labs    CBC No results for input(s): "WBC", "NEUTROABS", "HGB", "HCT", "MCV", "PLT" in the last 72 hours. Basic Metabolic Panel Recent Labs    40/98/11 0402 11/18/23 0455  NA 141 140  K 4.0 4.1  CL 107  106  CO2 26 28  GLUCOSE 109* 119*  BUN 13 15  CREATININE 0.69 0.81  CALCIUM 9.1 9.4  MG 2.3 2.1    Telemetry    SB 50's - SR 80's with occ PAC's (personally reviewed)  Patient Profile     Lisa Cardenas is a 73 y.o. female with a PMH of persistent atrial fibrillation, severe OSA (not yet on CPAP), HTN.They were admitted for tikosyn load / high risk drug monitoring.  QTc in NSR prior to admit 433 ms. Pt converted on evening after first dose of Tikosyn. Remains in NSR.   Assessment & Plan    Persistent Atrial Fibrillation Pt remains in NSR on Tikosyn 500 mcg BID  Continue Xarelto  Creatinine, ser  0.81 (05/08 0455) Magnesium   2.1 (05/08 0455) Potassium4.1 (05/08 0455) No electrolyte supplementation needed  Plan for home Friday if QTc remains stable.   For questions or updates, please contact [facility] Please consult [email or web address] for contact info under Cardiology/STEMI.  Signed, Creighton Doffing, NP-C, AGACNP-BC [facility] - Electrophysiology  11/18/2023, 6:42 AM   I have seen and examined this patient with Creighton Doffing.  Agree  with above, note added to reflect my findings.  Patient remains in sinus rhythm.  Feeling well without acute complaints.  GEN: No acute distress.   Neck: No JVD Cardiac: RRR, no murmurs, rubs, or gallops.  Respiratory: normal BS bases bilaterally. GI: Soft, nontender, non-distended  MS: No edema; No deformity. Neuro:  Nonfocal  Skin: warm and dry Psych: Normal affect    Persistent atrial fibrillation: Patient is converted to sinus rhythm.  She has had no further episodes of atrial fibrillation.  QTc remained stable at the current dose.  Potassium magnesium  do not require supplementation. Secondary hypercoagulable state: Continue Eliquis Hypertension: Continue home medications  Coline Calkin M. Kollen Armenti MD 11/18/2023 2:56 PM

## 2023-11-18 NOTE — Plan of Care (Signed)
   Problem: Education: Goal: Knowledge of General Education information will improve Description Including pain rating scale, medication(s)/side effects and non-pharmacologic comfort measures Outcome: Progressing

## 2023-11-19 ENCOUNTER — Other Ambulatory Visit (HOSPITAL_COMMUNITY): Payer: Self-pay

## 2023-11-19 LAB — BASIC METABOLIC PANEL WITH GFR
Anion gap: 9 (ref 5–15)
BUN: 15 mg/dL (ref 8–23)
CO2: 28 mmol/L (ref 22–32)
Calcium: 9.4 mg/dL (ref 8.9–10.3)
Chloride: 103 mmol/L (ref 98–111)
Creatinine, Ser: 0.87 mg/dL (ref 0.44–1.00)
GFR, Estimated: >60 mL/min (ref >60)
Glucose, Bld: 112 mg/dL — ABNORMAL HIGH (ref 70–99)
Potassium: 3.9 mmol/L (ref 3.5–5.1)
Sodium: 140 mmol/L (ref 135–145)

## 2023-11-19 LAB — MAGNESIUM: Magnesium: 2 mg/dL (ref 1.7–2.4)

## 2023-11-19 MED ORDER — MAGNESIUM OXIDE -MG SUPPLEMENT 400 (240 MG) MG PO TABS
400.0000 mg | ORAL_TABLET | Freq: Once | ORAL | Status: AC
  Administered 2023-11-19: 400 mg via ORAL
  Filled 2023-11-19: qty 1

## 2023-11-19 MED ORDER — POTASSIUM CHLORIDE CRYS ER 20 MEQ PO TBCR
40.0000 meq | EXTENDED_RELEASE_TABLET | Freq: Once | ORAL | Status: AC
  Administered 2023-11-19: 40 meq via ORAL
  Filled 2023-11-19: qty 2

## 2023-11-19 MED ORDER — DOFETILIDE 500 MCG PO CAPS
500.0000 ug | ORAL_CAPSULE | Freq: Two times a day (BID) | ORAL | 6 refills | Status: DC
  Filled 2023-11-19: qty 60, 30d supply, fill #0

## 2023-11-19 NOTE — Discharge Summary (Cosign Needed)
 ELECTROPHYSIOLOGY DISCHARGE SUMMARY    Patient ID: Lisa Cardenas,  MRN: [id number], DOB/AGE: 08-03-1950 73 y.o.  Admit date: 11/16/2023 Discharge date: 11/19/2023  Primary Care Physician: James Mcardle, MD  Primary Cardiologist: Eilleen Grates, MD  Electrophysiologist: Dr. Lawana Pray   Primary Discharge Diagnosis:  1.  Persistent atrial fibrillation status post Tikosyn  loading this admission  Secondary Discharge Diagnosis:  Severe OSA  HTN  Allergies  Allergen Reactions   Ciprofloxacin  Other (See Comments)   Hydroxychloroquine     Increased heart rate  Other Reaction(s): Not available   Topamax [Topiramate]     Other Reaction(s): other     Procedures This Admission:  1.  Tikosyn  loading   Brief HPI: Lisa Cardenas is a 73 y.o. female with a past medical history as noted above.  They were referred to EP for treatment options of atrial fibrillation.  Risks, benefits, and alternatives to Tikosyn  were reviewed with the patient who wished to proceed with admission for loading.  Hospital Course:  The patient was admitted and Tikosyn  was initiated.  Renal function and electrolytes were followed during the hospitalization.  Their QTc remained stable. The patient converted chemically and did not require cardioversion. The patients QTc remained stable. They were monitored on telemetry up to discharge. On the day of discharge, they were examined by Dr. Lawana Pray  who considered them stable for discharge to home.  Follow-up has been arranged with the Atrial Fibrillation clinic in approximately 1 week.   Physical Exam: Vitals:   11/18/23 1925 11/19/23 0045 11/19/23 0550 11/19/23 0931  BP: 133/64 111/60 122/83 126/63  Pulse: 66 68 62 64  Resp: 16 18 15 16   Temp: 98.3 F (36.8 C) 98 F (36.7 C) 98.3 F (36.8 C) 98.2 F (36.8 C)  TempSrc: Oral Oral Oral Oral  SpO2: 98% 98% 98% 93%  Weight:      Height:        GEN- NAD, A&O x 3. Normal affect.  Lungs- CTAB, Normal  effort.  Heart- Regular rate and rhythm. No M/G/R GI- Soft, NT, ND Extremities- No clubbing, cyanosis, or edema, baseline tremor noted during admit Skin- no rash or lesion  Labs:   Lab Results  Component Value Date   WBC 8.3 11/11/2023   HGB 14.9 11/11/2023   HCT 45.8 11/11/2023   MCV 84 11/11/2023   PLT 250 11/11/2023    Recent Labs  Lab 11/19/23 0406  NA 140  K 3.9  CL 103  CO2 28  BUN 15  CREATININE 0.87  CALCIUM  9.4  GLUCOSE 112*    Discharge Medications:  Allergies as of 11/19/2023       Reactions   Ciprofloxacin  Other (See Comments)   Hydroxychloroquine    Increased heart rate Other Reaction(s): Not available   Topamax [topiramate]    Other Reaction(s): other        Medication List     TAKE these medications    anastrozole  1 MG tablet Commonly known as: ARIMIDEX  Take 1 tablet (1 mg total) by mouth daily.   atorvastatin  20 MG tablet Commonly known as: LIPITOR Take 20 mg by mouth daily.   celecoxib  200 MG capsule Commonly known as: CELEBREX  Take 200 mg by mouth as needed.   diltiazem  240 MG 24 hr capsule Commonly known as: CARDIZEM  CD TAKE 1 CAPSULE BY MOUTH EVERY DAY   diltiazem  60 MG tablet Commonly known as: CARDIZEM  Take 60 mg by mouth as needed. Prescribed by PCP. Takes for breakthrough  AFib   dofetilide  500 MCG capsule Commonly known as: TIKOSYN  Take 1 capsule (500 mcg total) by mouth 2 (two) times daily.   esomeprazole  40 MG capsule Commonly known as: NEXIUM  Take 40 mg by mouth 2 (two) times daily as needed.   levothyroxine  137 MCG tablet Commonly known as: SYNTHROID  Take 137 mcg by mouth daily. Current dose 137 mcg daily   LORazepam  0.5 MG tablet Commonly known as: ATIVAN  Take 0.5 mg by mouth 2 (two) times daily as needed for anxiety.   olmesartan  20 MG tablet Commonly known as: BENICAR  Take 20 mg by mouth daily.   pregabalin  50 MG capsule Commonly known as: LYRICA  Take 50 mg by mouth 2 (two) times daily.    rivaroxaban  20 MG Tabs tablet Commonly known as: XARELTO  Take 20 mg by mouth daily.   sertraline  100 MG tablet Commonly known as: ZOLOFT  Take 50 mg by mouth daily.   Vitamin D  (Ergocalciferol ) 1.25 MG (50000 UNIT) Caps capsule Commonly known as: DRISDOL  Take 50,000 Units by mouth once a week.        Disposition:  Home with follow up in AF clinic in 1 week as in AVS.   Duration of Discharge Encounter:  APP time: 35 minutes  Signed, Creighton Doffing, NP-C, AGACNP-BC  [facility] - Elect[facility]iology  11/19/2023, 11:14 AM   I have seen and examined this patient with Creighton Doffing.  Agree with above, note added to reflect my findings.  Patient admitted to the hospital with atrial fibrillation for dofetilide  load.  She was previously on sotalol.  She converted to sinus rhythm after her initial dose of dofetilide .  QTc remained stable.  Plan for discharge today with follow-up in clinic.  GEN: No acute distress.   Neck: No JVD Cardiac: RRR, no murmurs, rubs, or gallops.  Respiratory: normal BS bases bilaterally. GI: Soft, nontender, non-distended  MS: No edema; No deformity. Neuro:  Nonfocal  Skin: warm and dry Psych: Normal affect   MD time of discharge: 40 minutes  Will M. Camnitz MD 11/22/2023 7:12 AM

## 2023-11-19 NOTE — Progress Notes (Signed)
 Mobility Specialist Progress Note:    11/19/23 1000  Mobility  Activity Ambulated with assistance in hallway  Level of Assistance Standby assist, set-up cues, supervision of patient - no hands on  Assistive Device Cane  Distance Ambulated (ft) 400 ft  Activity Response Tolerated well  Mobility Referral Yes  Mobility visit 1 Mobility  Mobility Specialist Start Time (ACUTE ONLY) 0947  Mobility Specialist Stop Time (ACUTE ONLY) 0953  Mobility Specialist Time Calculation (min) (ACUTE ONLY) 6 min   Pt received in bed and agreeable. Required no physical assistance throughout. C/o mild headache and knee pain. Otherwise asymptomatic. Pt left on EOB with call bell and all needs met.  D'Vante Nolon Baxter Mobility Specialist Please contact via Special educational needs teacher or Rehab office at [phone]

## 2023-11-19 NOTE — Discharge Instructions (Signed)
 Dofetilide Capsules What is this medication? DOFETILIDE (doe FET il ide) treats a fast or irregular heartbeat (arrhythmia). It works by slowing down overactive electric signals in the heart, which stabilizes your heart rhythm. It belongs to a group of medications called antiarrhythmics. This medicine may be used for other purposes; ask your health care provider or pharmacist if you have questions. COMMON BRAND NAME(S): Tikosyn What should I tell my care team before I take this medication? They need to know if you have any of these conditions: Heart disease History of irregular heartbeat History of low levels of potassium or magnesium in the blood Kidney disease Liver disease An unusual or allergic reaction to dofetilide, other medications, foods, dyes, or preservatives Pregnant or trying to get pregnant Breast-feeding How should I use this medication? Take this medication by mouth with a glass of water. Follow the directions on the prescription label. Do not take with grapefruit juice. You can take it with or without food. If it upsets your stomach, take it with food. Take your medication at regular intervals. Do not take it more often than directed. Do not stop taking except on your care team's advice. A special MedGuide will be given to you by the pharmacist with each prescription and refill. Be sure to read this information carefully each time. Talk to your care team about the use of this medication in children. Special care may be needed. Overdosage: If you think you have taken too much of this medicine contact a poison control center or emergency room at once. NOTE: This medicine is only for you. Do not share this medicine with others. What if I miss a dose? If you miss a dose, skip it. Take your next dose at the normal time. Do not take extra or 2 doses at the same time to make up for the missed dose. What may interact with this medication? Do not take this medication with any of the  following: Benadryl (Diphenhydramine) Cimetidine Cisapride Dolutegravir Dronedarone Erdafitinib Hydrochlorothiazide Immodium Ketoconazole Megestrol Pimozide Prochlorperazine Thioridazine Trimethoprim Verapamil This medication may also interact with the following: Amiloride Cannabinoids Certain antibiotics like erythromycin or clarithromycin Certain antiviral medications for HIV or hepatitis Certain medications for depression, anxiety, or psychotic disorders Digoxin Diltiazem Grapefruit juice Metformin Nefazodone Other medications that prolong the QT interval (an abnormal heart rhythm) Quinine Triamterene Zafirlukast Ziprasidone This list may not describe all possible interactions. Give your health care provider a list of all the medicines, herbs, non-prescription drugs, or dietary supplements you use. Also tell them if you smoke, drink alcohol, or use illegal drugs. Some items may interact with your medicine. What should I watch for while using this medication? Your condition will be monitored carefully while you are receiving this medication. What side effects may I notice from receiving this medication? Side effects that you should report to your care team as soon as possible: Allergic reactions--skin rash, itching, hives, swelling of the face, lips, tongue, or throat Chest pain Heart rhythm changes--fast or irregular heartbeat, dizziness, feeling faint or lightheaded, chest pain, trouble breathing Side effects that usually do not require medical attention (report to your care team if they continue or are bothersome): Dizziness Headache Nausea Stomach pain Trouble sleeping This list may not describe all possible side effects. Call your doctor for medical advice about side effects. You may report side effects to FDA at 1-800-FDA-1088. Where should I keep my medication? Keep out of the reach of children. Store at room temperature between 15 and 30 degrees  C (59 and 86  degrees F). Throw away any unused medication after the expiration date. NOTE: This sheet is a summary. It may not cover all possible information. If you have questions about this medicine, talk to your doctor, pharmacist, or health care provider.  2024 Elsevier/Gold Standard (2021-05-30 00:00:00)

## 2023-11-19 NOTE — Progress Notes (Signed)
 EKG from yesterday evening 11/18/2023 reviewed     Shows remains in NSR with stable QTc at 441 ms (V3 measured)  Continue  Tikosyn  500 mcg BID.   Potassium3.9 (05/09 0406), supplement K+  Magnesium   2.0 (05/09 0406), supplement Mg+   Creatinine, ser  0.87 (05/09 0406)  Plan for home Friday if QTc remains stable     Creighton Doffing, NP-C, AGACNP-BC [facility] - Electrophysiology  11/19/2023, 6:38 AM

## 2023-11-19 NOTE — Progress Notes (Signed)
 Pharmacy: Dofetilide  (Tikosyn ) - Follow Up Assessment and Electrolyte Replacement  Pharmacy consulted to assist in monitoring and replacing electrolytes in this 73 y.o. female admitted on 11/16/2023 undergoing dofetilide  initiation. First dofetilide  dose: 11/16/23  Labs:    Component Value Date/Time   K 3.9 11/19/2023 0406   MG 2.0 11/19/2023 0406     Plan: Potassium: K 3.8-3.9:  Give KCl 40 mEq po x1   Magnesium : Mg 1.8-2: Give Mg 2 gm IV x1  >> PO x1 ordered by EP   As patient has required on average 10 mEq of potassium replacement every day, recommend discharging patient with prescription for:  Potassium chloride 10 mEq  daily  Thank you for allowing pharmacy to participate in this patient's care   Sheron Dixons 11/19/2023  7:05 AM

## 2023-11-26 ENCOUNTER — Inpatient Hospital Stay (INDEPENDENT_AMBULATORY_CARE_PROVIDER_SITE_OTHER)
Admission: RE | Admit: 2023-11-26 | Discharge: 2023-11-26 | Disposition: A | Discharge status: Home / Self Care | Source: Ambulatory Visit | Attending: Internal Medicine | Admitting: Internal Medicine

## 2023-11-26 VITALS — BP 118/70 | HR 98 | Ht 64.0 in | Wt 255.8 lb

## 2023-11-26 DIAGNOSIS — I484 Atypical atrial flutter: Secondary | ICD-10-CM

## 2023-11-26 DIAGNOSIS — I480 Paroxysmal atrial fibrillation: Secondary | ICD-10-CM

## 2023-11-26 DIAGNOSIS — Z5181 Encounter for therapeutic drug level monitoring: Secondary | ICD-10-CM

## 2023-11-26 DIAGNOSIS — D6869 Other thrombophilia: Secondary | ICD-10-CM

## 2023-11-26 DIAGNOSIS — Z79899 Other long term (current) drug therapy: Secondary | ICD-10-CM

## 2023-11-26 NOTE — Progress Notes (Addendum)
 Primary Care Physician: James Mcardle, MD Primary Cardiologist: Dr. Lavonne Prairie Primary Electrophysiologist: Dr. Lawana Pray Referring Physician: Dr. Reyna Cava Quineisha Phenicie is a 73 y.o. female with a history of breast cancer, HLD, GERD, HTN, and atrial fibrillation who presents for consultation in the [facility] Atrial Fibrillation [facility].  The patient was initially diagnosed with atrial fibrillation in 2019 per record review. She has previously been seen in [place], [place], and treated with sotalol. She currently takes cardizem  240 mg daily and sotalol 80 mg BID. Patient is on Xarelto  20 mg daily for a CHADS2VASC score of 3.  On evaluation, she is in SR. Review of records show patient contacted cardiology office on 4/25 stating she was in Afib. She contacted again on 5/3 stating she was back in Afib again. She feels fatigued when in Afib and can feel increased heart rate. She has been on sotalol since ~October 2023. She uses her Apple Watch to help detect when she is in Afib. She has had about 3 or 4 episodes of Afib since January of this year.   She has stopped drinking caffeinated beverages. She does not drink alcohol. She thinks she might snore but not sure if stops breathing.   She is compliant with anticoagulation and has not missed any doses. She has no bleeding concerns.  On follow up 08/04/23, she is currently in NSR. Seen by Dr. Lavonne Prairie in October and patient noted ~monthly episodes of Afib that appear to have controlled rates. No missed doses of sotalol or Xarelto . She has noted that acute stress or too much caffeine can be her triggers for Afib.   Follow up 11/16/23. Patient returns for follow up for atrial fibrillation and dofetilide  admission. She remains in rapid atrial flutter today. She has discontinued sotalol. No bleeding issues on anticoagulation.   On follow up 11/26/23, she is here for Tikosyn  surveillance. She is currently in Afib. S/p Tikosyn  admission 5/6-03/2024. She  chemically converted to normal rhythm and did not require cardioversion. She was discharged on Tikosyn  500 mcg BID. Episode of Afib began this morning at ~0100. She took PRN diltiazem . She has an Apple watch for monitoring rhythm at home. No missed doses of Xarelto  20 mg daily.   Today, she  denies symptoms of chest pain, shortness of breath, orthopnea, PND, lower extremity edema, dizziness, presyncope, syncope, bleeding, or neurologic sequela. The patient is tolerating medications without difficulties and is otherwise without complaint today.    Atrial Fibrillation Risk Factors:  she does have symptoms or diagnosis of sleep apnea. she does not have a history of rheumatic fever. she does not have a history of alcohol use. The patient does not have a history of early familial atrial fibrillation or other arrhythmias.   Atrial Fibrillation Management history:  Previous antiarrhythmic drugs: Sotalol, tikosyn  Previous cardioversions: Unknown Previous ablations: None Anticoagulation history: Xarelto     Past Medical History:  Diagnosis Date   A-fib [facility])    no issues since May 2020   Cancer [facility])    Diverticulosis    Elevated cholesterol    Hypertension    Obesity    Pancreatitis    Thyroid disease    Trigeminal neuralgia     Current Outpatient Medications  Medication Sig Dispense Refill   anastrozole  (ARIMIDEX ) 1 MG tablet Take 1 tablet (1 mg total) by mouth daily. 90 tablet 3   atorvastatin  (LIPITOR) 20 MG tablet Take 20 mg by mouth daily.     celecoxib  (CELEBREX ) 200 MG  capsule Take 200 mg by mouth as needed.     diltiazem  (CARDIZEM  CD) 240 MG 24 hr capsule TAKE 1 CAPSULE BY MOUTH EVERY DAY 90 capsule 3   diltiazem  (CARDIZEM ) 60 MG tablet Take 60 mg by mouth as needed. Prescribed by PCP. Takes for breakthrough AFib     dofetilide  (TIKOSYN ) 500 MCG capsule Take 1 capsule (500 mcg total) by mouth 2 (two) times daily. 60 capsule 6   esomeprazole  (NEXIUM ) 40 MG capsule Take 40 mg  by mouth 2 (two) times daily as needed.     levothyroxine  (SYNTHROID ) 137 MCG tablet Take 137 mcg by mouth daily. Current dose 137 mcg daily     LORazepam  (ATIVAN ) 0.5 MG tablet Take 0.5 mg by mouth 2 (two) times daily as needed for anxiety.     olmesartan  (BENICAR ) 20 MG tablet Take 20 mg by mouth daily.     pregabalin  (LYRICA ) 50 MG capsule Take 50 mg by mouth 2 (two) times daily.     rivaroxaban  (XARELTO ) 20 MG TABS tablet Take 20 mg by mouth daily.     sertraline  (ZOLOFT ) 100 MG tablet Take 50 mg by mouth daily.     Vitamin D , Ergocalciferol , (DRISDOL ) 1.25 MG (50000 UNIT) CAPS capsule Take 50,000 Units by mouth once a week.     No current facility-administered medications for this encounter.    Allergies  Allergen Reactions   Ciprofloxacin  Other (See Comments)   Hydroxychloroquine     Increased heart rate  Other Reaction(s): Not available   Topamax [Topiramate]     Other Reaction(s): other   ROS- All systems are reviewed and negative except as per the HPI above.  Physical Exam: Vitals:   11/26/23 1026  BP: 118/70  Pulse: 98  Weight: 116 kg  Height: 5\' 4"  (1.626 m)    GEN- The patient is well appearing, alert and oriented x 3 today.   Neck - no JVD or carotid bruit noted Lungs- Clear to ausculation bilaterally, normal work of breathing Heart- Irregular rate and rhythm, no murmurs, rubs or gallops, PMI not laterally displaced Extremities- no clubbing, cyanosis, or edema Skin - no rash or ecchymosis noted   Wt Readings from Last 3 Encounters:  11/26/23 116 kg  11/16/23 117.3 kg  11/16/23 117.8 kg    EKG today demonstrates  Vent. rate 98 BPM PR interval * ms QRS duration 68 ms QT/QTcB 352/449 ms P-R-T axes * 35 18 Atrial fibrillation with premature ventricular or aberrantly conducted complexes Low voltage QRS Nonspecific T wave abnormality Abnormal ECG When compared with ECG of 19-Nov-2023 10:05, Atrial fibrillation has replaced Sinus rhythm Vent. rate has  increased BY 38 BPM   Echo 07/08/2017 outside study demonstrated: Normal EF Mild concentric LVH  Epic records are reviewed at length today.   CHA2DS2-VASc Score = 3  The patient's score is based upon: CHF History: 0 HTN History: 1 Diabetes History: 0 Stroke History: 0 Vascular Disease History: 0 Age Score: 1 Gender Score: 1       ASSESSMENT AND PLAN: Paroxysmal Atrial Fibrillation/atypical atrial flutter The patient's CHA2DS2-VASc score is 3, indicating a 3.2% annual risk of stroke.   S/p Tikosyn  admission 5/6-03/2024.  She is currently in Afib. She went into Afib earlier this morning. We discussed next steps in terms of treatment. Continue diltiazem  PRN. Tikosyn  teaching revisited. If she remains persistent would recommend cardioversion. We discussed risks vs benefits of cardioversion. Will draw Tikosyn  labs today and in addition a CBC in case cardioversion  is needed. Patient will continue to monitor symptoms and Apple watch recordings and contact us  next week. If she calls and has remained in persistent Afib, will schedule cardioversion. If she is paroxysmal, will have her continue Tikosyn  without change and have her follow up as scheduled with Dr. Lawana Pray.  High risk medication monitoring (ICD10: U5195107) Patient requires ongoing monitoring for anti-arrhythmic medication which has the potential to cause life threatening arrhythmias or AV block. Qtc stable. Continue Tikosyn  500 mcg BID. Bmet and mag drawn today.  Secondary Hypercoagulable State (ICD10:  D68.69) The patient is at significant risk for stroke/thromboembolism based upon her CHA2DS2-VASc Score of 3.  Continue Rivaroxaban  (Xarelto ).  No missed doses.   HTN Stable today.   Follow up in 1 month for Tikosyn  surveillance as scheduled.    Woody Heading, PA-C Afib Clinic [facility] [address] [place], [place] [address] [phone] 11/26/2023 11:30 AM

## 2023-11-26 NOTE — Patient Instructions (Addendum)
 Call with update of heart rhythm next week [phone] option 2

## 2023-11-27 LAB — BASIC METABOLIC PANEL WITH GFR
BUN/Creatinine Ratio: 20 (ref 12–28)
BUN: 17 mg/dL (ref 8–27)
CO2: 21 mmol/L (ref 20–29)
Calcium: 10 mg/dL (ref 8.7–10.3)
Chloride: 104 mmol/L (ref 96–106)
Creatinine, Ser: 0.84 mg/dL (ref 0.57–1.00)
Glucose: 112 mg/dL — ABNORMAL HIGH (ref 70–99)
Potassium: 4.8 mmol/L (ref 3.5–5.2)
Sodium: 142 mmol/L (ref 134–144)
eGFR: 74 mL/min/{1.73_m2} (ref >59)

## 2023-11-27 LAB — CBC
Hematocrit: 48.4 % — ABNORMAL HIGH (ref 34.0–46.6)
Hemoglobin: 15.2 g/dL (ref 11.1–15.9)
MCH: 26.6 pg (ref 26.6–33.0)
MCHC: 31.4 g/dL — ABNORMAL LOW (ref 31.5–35.7)
MCV: 85 fL (ref 79–97)
Platelets: 273 10*3/uL (ref 150–450)
RBC: 5.72 x10E6/uL — ABNORMAL HIGH (ref 3.77–5.28)
RDW: 14.8 % (ref 11.7–15.4)
WBC: 8.2 10*3/uL (ref 3.4–10.8)

## 2023-11-27 LAB — MAGNESIUM: Magnesium: 2 mg/dL (ref 1.6–2.3)

## 2023-11-29 ENCOUNTER — Ambulatory Visit (HOSPITAL_COMMUNITY): Payer: Self-pay | Admitting: Internal Medicine

## 2023-11-30 ENCOUNTER — Telehealth (HOSPITAL_COMMUNITY): Payer: Self-pay | Admitting: *Deleted

## 2023-11-30 NOTE — Telephone Encounter (Signed)
 Patient called in stating she converted back to normal rhythm Friday evening. Feeling back to baseline. Heart rates in the 70s. Pt does report she continued taking PRN cardizem  60mg  tablet at bedtime to "prevent" afib. Pt instructed to hold PRN cardizem  unless heart rates are over 100. If she sees an increased amount of afib with stopping the PRN cardizem  she will let our office know. Pt in agreement.

## 2023-12-20 ENCOUNTER — Encounter: Payer: Self-pay | Admitting: Cardiology

## 2023-12-20 ENCOUNTER — Ambulatory Visit: Attending: Cardiology | Admitting: Cardiology

## 2023-12-20 VITALS — BP 116/82 | HR 68 | Ht 64.0 in | Wt 258.5 lb

## 2023-12-20 DIAGNOSIS — Z79899 Other long term (current) drug therapy: Secondary | ICD-10-CM | POA: Insufficient documentation

## 2023-12-20 DIAGNOSIS — I484 Atypical atrial flutter: Secondary | ICD-10-CM | POA: Diagnosis not present

## 2023-12-20 DIAGNOSIS — G259 Extrapyramidal and movement disorder, unspecified: Secondary | ICD-10-CM | POA: Insufficient documentation

## 2023-12-20 DIAGNOSIS — R251 Tremor, unspecified: Secondary | ICD-10-CM | POA: Insufficient documentation

## 2023-12-20 DIAGNOSIS — I482 Chronic atrial fibrillation, unspecified: Secondary | ICD-10-CM | POA: Insufficient documentation

## 2023-12-20 DIAGNOSIS — Z5181 Encounter for therapeutic drug level monitoring: Secondary | ICD-10-CM | POA: Diagnosis not present

## 2023-12-20 NOTE — Patient Instructions (Addendum)
 Medication Instructions:  Your physician recommends that you continue on your current medications as directed. Please refer to the Current Medication list given to you today.  *If you need a refill on your cardiac medications before your next appointment, please call your pharmacy*  Lab Work: None ordered   Testing/Procedures: None ordered  Follow-Up: At [facility], you and your health needs are our priority.  As part of our continuing mission to provide you with exceptional heart care, our providers are all part of one team.  This team includes your primary Cardiologist (physician) and Advanced Practice Providers or APPs (Physician Assistants and Nurse Practitioners) who all work together to provide you with the care you need, when you need it.   You have been referred to [facility] Neuro (Dr. Albertina Hugger)   Your next appointment:   6 month(s)  Provider:   You will follow up in the Atrial Fibrillation Clinic located at [facility]. Your provider will be: Clint R. Fenton, PA-C or Minnie Amber, PA-C     Thank you for choosing [employer]!!   Reece Cane, RN [phone]

## 2023-12-20 NOTE — Progress Notes (Signed)
  Electrophysiology Office Note:   Date:  12/20/2023  ID:  Lisa Cardenas, Lisa Cardenas [birth year], MRN [id number]  Primary Cardiologist: Eilleen Grates, MD Primary Heart Failure: None Electrophysiologist: Elvia Aydin Cortland Ding, MD      History of Present Illness:   Lisa Cardenas is a 73 y.o. female with h/o breast cancer, hyperlipidemia, GERD, hypertension, atrial fibrillation seen today for routine electrophysiology followup.   Since last being seen in our clinic the patient reports doing overall well.  She has no chest pain or shortness of breath.  She did come to atrial fibrillation clinic after her dofetilide  load in atrial fibrillation.  She has since converted to sinus rhythm.  She thinks that she is having rare episodes of atrial fibrillation that are all short.  She is happy with her control she is having some tremors that she is concerned about Parkinson's.  She has about neurology follow-up.  she denies chest pain, palpitations, dyspnea, PND, orthopnea, nausea, vomiting, dizziness, syncope, edema, weight gain, or early satiety.   Review of systems complete and found to be negative unless listed in HPI.   EP Information / Studies Reviewed:    EKG is ordered today. Personal review as below.        Risk Assessment/Calculations:    CHA2DS2-VASc Score = 3   This indicates a 3.2% annual risk of stroke. The patient's score is based upon: CHF History: 0 HTN History: 1 Diabetes History: 0 Stroke History: 0 Vascular Disease History: 0 Age Score: 1 Gender Score: 1            Physical Exam:   VS:  There were no vitals taken for this visit.   Wt Readings from Last 3 Encounters:  11/26/23 255 lb 12.8 oz (116 kg)  11/16/23 258 lb 9.6 oz (117.3 kg)  11/16/23 259 lb 9.6 oz (117.8 kg)     GEN: Well nourished, well developed in no acute distress NECK: No JVD; No carotid bruits CARDIAC: Regular rate and rhythm, no murmurs, rubs, gallops RESPIRATORY:  Clear to auscultation  without rales, wheezing or rhonchi  ABDOMEN: Soft, non-tender, non-distended EXTREMITIES:  No edema; No deformity   ASSESSMENT AND PLAN:    1.  Paroxysmal atrial fibrillation/atypical atrial flutter: Post dofetilide  load.  Unfortunately she is continue to have arrhythmias.  She currently feels well, and her episodes of atrial fibrillation are all quite short.  She is happy with her control.  No changes.  2.  Secondary hypercoagulable state: On Xarelto   3.  High-risk medication monitoring: On dofetilide .  QTc remained stable.  Recent labs within normal limits.  4.  Hypertension: Well-controlled  5.  Obstructive sleep apnea: CPAP compliance encouraged  6.  Tremors: Patient is concerned about Parkinson's.  Lisa Cardenas refer to neurology.  Follow up with Afib Clinic in 6 months  Signed, Mckala Pantaleon Cortland Ding, MD

## 2023-12-23 DIAGNOSIS — G4733 Obstructive sleep apnea (adult) (pediatric): Secondary | ICD-10-CM | POA: Insufficient documentation

## 2023-12-23 NOTE — Progress Notes (Signed)
  Cardiology Office Note:   Date:  12/24/2023  ID:  Lisa Cardenas, Lisa Cardenas 1950-08-03, MRN [id number] PCP: Chanequa Spees Mcardle, MD  [facility] Providers Cardiologist:  Eilleen Grates, MD Electrophysiologist:  Will Cortland Ding, MD {  History of Present Illness:   Lisa Cardenas is a 73 y.o. female who presents for evaluation of atrial fibrillation.  She has previously been seen in [place] .  She has had paroxysmal atrial fibrillation since 2019.  She was treated with sotalol.  Echocardiogram previously has demonstrated well-preserved ejection fraction.  She is currently controlled on Tikosyn .   She has been found to have severe obstructive sleep apnea.    Since I last saw her her palpitations are improved.  The patient denies any new symptoms such as chest discomfort, neck or arm discomfort. There has been no new shortness of breath, PND or orthopnea. There have been no reported palpitations, presyncope or syncope.   She might have a rare skipped beat.  She is doing some physical therapy but she is limited by bilateral knee pain.  Of note late last year she had severe sleep apnea on screening stress testing but she did not answer the phone calls when she was called to be scheduled for follow-up.  ROS: As stated in the HPI and negative for all other systems.  Studies Reviewed:    EKG:     NA  Risk Assessment/Calculations:    CHA2DS2-VASc Score = 3   This indicates a 3.2% annual risk of stroke. The patient's score is based upon: CHF History: 0 HTN History: 1 Diabetes History: 0 Stroke History: 0 Vascular Disease History: 0 Age Score: 1 Gender Score: 1     Physical Exam:   VS:  BP 126/74 (BP Location: Right Arm, Patient Position: Sitting, Cuff Size: Large)   Pulse 71   Ht 5' 4 (1.626 m)   Wt 257 lb (116.6 kg)   SpO2 94%   BMI 44.11 kg/m    Wt Readings from Last 3 Encounters:  12/24/23 257 lb (116.6 kg)  12/20/23 258 lb 8 oz (117.3 kg)  11/26/23 255  lb 12.8 oz (116 kg)     GEN: Well nourished, well developed in no acute distress NECK: No JVD; No carotid bruits CARDIAC: RRR, soft brief apical and left upper sternal border systolic murmur nonradiating, no diastolic murmurs, rubs, gallops RESPIRATORY:  Clear to auscultation without rales, wheezing or rhonchi  ABDOMEN: Soft, non-tender, non-distended EXTREMITIES:  No edema; No deformity   ASSESSMENT AND PLAN:   Hypertension:   Her blood pressure is at target.  No change in therapy.     Atrial fibrillation: She is tolerating anticoagulation.  She is tolerating Tikosyn .  I will check potassium and magnesium  in about 3 months.  She will continue the meds as listed.  Sleep apnea: She had severe sleep apnea.  We had a long conversation about this again today and she consents to being seen and treated.  We will arrange follow-up.  Pulmonary HTN:  This was mild on [place] echo.  She also had some mild TR and AI.  This was 2018.  I will follow-up with an echocardiogram.  Morbid obesity: She consents to being seen by [facility] and I will make this referral.     Follow up with me in six months.   Signed, Eilleen Grates, MD

## 2023-12-24 ENCOUNTER — Encounter: Payer: Self-pay | Admitting: Cardiology

## 2023-12-24 ENCOUNTER — Ambulatory Visit: Attending: Cardiology | Admitting: Cardiology

## 2023-12-24 VITALS — BP 126/74 | HR 71 | Ht 64.0 in | Wt 257.0 lb

## 2023-12-24 DIAGNOSIS — G4733 Obstructive sleep apnea (adult) (pediatric): Secondary | ICD-10-CM | POA: Insufficient documentation

## 2023-12-24 DIAGNOSIS — I10 Essential (primary) hypertension: Secondary | ICD-10-CM | POA: Diagnosis present

## 2023-12-24 DIAGNOSIS — I4819 Other persistent atrial fibrillation: Secondary | ICD-10-CM | POA: Insufficient documentation

## 2023-12-24 DIAGNOSIS — R002 Palpitations: Secondary | ICD-10-CM | POA: Diagnosis present

## 2023-12-24 DIAGNOSIS — Z6841 Body Mass Index (BMI) 40.0 and over, adult: Secondary | ICD-10-CM | POA: Insufficient documentation

## 2023-12-24 NOTE — Patient Instructions (Signed)
 Medication Instructions:  Your physician recommends that you continue on your current medications as directed. Please refer to the Current Medication list given to you today.  *If you need a refill on your cardiac medications before your next appointment, please call your pharmacy*  Lab Work: BMET and Magnesium  in August If you have labs (blood work) drawn today and your tests are completely normal, you will receive your results only by: MyChart Message (if you have MyChart) OR A paper copy in the mail If you have any lab test that is abnormal or we need to change your treatment, we will call you to review the results.  Testing/Procedures: Echo Your physician has requested that you have an echocardiogram. Echocardiography is a painless test that uses sound waves to create images of your heart. It provides your doctor with information about the size and shape of your heart and how well your heart's chambers and valves are working. This procedure takes approximately one hour. There are no restrictions for this procedure. Please do NOT wear cologne, perfume, aftershave, or lotions (deodorant is allowed). Please arrive 15 minutes prior to your appointment time.  Please note: We ask at that you not bring children with you during ultrasound (echo/ vascular) testing. Due to room size and safety concerns, children are not allowed in the ultrasound rooms during exams. Our front office staff cannot provide observation of children in our lobby area while testing is being conducted. An adult accompanying a patient to their appointment will only be allowed in the ultrasound room at the discretion of the ultrasound technician under special circumstances. We apologize for any inconvenience.   Follow-Up: At [facility], you and your health needs are our priority.  As part of our continuing mission to provide you with exceptional heart care, our providers are all part of one team.  This team includes  your primary Cardiologist (physician) and Advanced Practice Providers or APPs (Physician Assistants and Nurse Practitioners) who all work together to provide you with the care you need, when you need it.  Your next appointment:   6 month(s)  Provider:   Eilleen Grates, MD    We recommend signing up for the patient portal called MyChart.  Sign up information is provided on this After Visit Summary.  MyChart is used to connect with patients for Virtual Visits (Telemedicine).  Patients are able to view lab/test results, encounter notes, upcoming appointments, etc.  Non-urgent messages can be sent to your provider as well.   To learn more about what you can do with MyChart, go to [email or web address].   Other Instructions You have been referred to [facility]. Someone will reach out to you to make an appointment.

## 2024-01-11 ENCOUNTER — Encounter (INDEPENDENT_AMBULATORY_CARE_PROVIDER_SITE_OTHER): Payer: Self-pay

## 2024-01-18 ENCOUNTER — Telehealth: Payer: Self-pay | Admitting: Nurse Practitioner

## 2024-01-18 MED ORDER — AMOXICILLIN-POT CLAVULANATE 875-125 MG PO TABS: 1.0000 | ORAL_TABLET | Freq: Two times a day (BID) | ORAL | 0 refills | Status: AC

## 2024-01-18 NOTE — Telephone Encounter (Signed)
 Patient calls stating that she recently had popcorn at the movies and then Sunday night, began having dull, achy, intermittent lower abdominal discomfort, worse before bowel movements. States she has been having loose stool and accompanying mucus. Denies any hematochezia or melena. Denies any fever/chills. Patient states that she feels this is a diverticulitis flare. No nausea/vomiting.  Last colonoscopy 05/2022 showed moderate diverticulosis.  Last office visit 12/2022 with Broadus Sharps, NP.  Next available APP appointment is not until 01/25/24.  Please advise.[email or web address]

## 2024-01-18 NOTE — Telephone Encounter (Signed)
 Advised patient of Dr Pamula response/recommendations. Rx for Augmentin  sent to patient's pharmacy and she agrees to call back should symptoms fail to improve or worsen.

## 2024-01-18 NOTE — Telephone Encounter (Signed)
 Left message for patient to call back

## 2024-01-18 NOTE — Telephone Encounter (Signed)
 Inbound call from patient stating she ate popcorn and had dairy products. Patient states she is having complications with her IBS and requesting for a prescription augmentin  to be sent to her pharmacy. Patient requesting a call back. Please advise, thank you.

## 2024-01-18 NOTE — Telephone Encounter (Signed)
 Augmentin  875 mg twice daily x 7 days for diverticulitis Call if worsening or not resolved

## 2024-01-19 ENCOUNTER — Institutional Professional Consult (permissible substitution) (INDEPENDENT_AMBULATORY_CARE_PROVIDER_SITE_OTHER): Admitting: Family Medicine

## 2024-02-04 ENCOUNTER — Other Ambulatory Visit: Payer: Medicare Other

## 2024-02-04 ENCOUNTER — Ambulatory Visit: Payer: Medicare Other | Admitting: Adult Health

## 2024-02-07 ENCOUNTER — Ambulatory Visit (HOSPITAL_COMMUNITY)
Admission: RE | Admit: 2024-02-07 | Discharge: 2024-02-07 | Disposition: A | Discharge status: Home / Self Care | Source: Ambulatory Visit | Attending: Cardiology | Admitting: Cardiology

## 2024-02-07 DIAGNOSIS — I4819 Other persistent atrial fibrillation: Secondary | ICD-10-CM | POA: Insufficient documentation

## 2024-02-07 DIAGNOSIS — G4733 Obstructive sleep apnea (adult) (pediatric): Secondary | ICD-10-CM | POA: Diagnosis present

## 2024-02-07 LAB — ECHOCARDIOGRAM COMPLETE
AR max vel: 2.52 cm2
AV Area VTI: 2.56 cm2
AV Area mean vel: 2.48 cm2
AV Mean grad: 4 mmHg
AV Peak grad: 8 mmHg
Ao pk vel: 1.41 m/s
Area-P 1/2: 3.59 cm2
S' Lateral: 3 cm

## 2024-02-09 ENCOUNTER — Ambulatory Visit: Payer: Self-pay | Admitting: Cardiology

## 2024-02-14 ENCOUNTER — Other Ambulatory Visit: Payer: Self-pay

## 2024-02-14 ENCOUNTER — Institutional Professional Consult (permissible substitution) (INDEPENDENT_AMBULATORY_CARE_PROVIDER_SITE_OTHER): Admitting: Family Medicine

## 2024-02-14 ENCOUNTER — Other Ambulatory Visit: Payer: Self-pay | Admitting: Adult Health

## 2024-02-14 ENCOUNTER — Telehealth: Payer: Self-pay | Admitting: Internal Medicine

## 2024-02-14 DIAGNOSIS — Z170 Estrogen receptor positive status [ER+]: Secondary | ICD-10-CM

## 2024-02-14 NOTE — Telephone Encounter (Signed)
 Pt states she is having issues with reflux at night anf having to sleep elevated. States she is not eating after 4pm. Discussed with her she could try pepcid 20mg  otc at bedtime to see if that helped until her appt. She also states she has had some abdominal cramping and diarrhea. Asked her if she had tried Imodium otc, pt states she can't take a lot of otc meds. Pt will keep her appt as scheduled.

## 2024-02-14 NOTE — Telephone Encounter (Signed)
 Patient requesting f/u call to discuss acid reflux.   Scheduled for next available with PA  Please advise. Thank you

## 2024-02-14 NOTE — Telephone Encounter (Signed)
 Pt notified of pharmacy's suggestion. Pt verbalized understanding. All questions if any were answered.

## 2024-02-14 NOTE — Telephone Encounter (Signed)
-----   Message from Lynwood Schilling sent at 02/09/2024  7:54 AM EDT ----- EF is normal.  No evidence of pulmonary hypertension.  No change in therapy.  No further imaging.  Call Ms. Carbin with the results and send results to Milana Sharper, MD  ----- Message ----- From: Interface, [employer] Sent: 02/07/2024   3:48 PM EDT To: Lynwood Schilling, MD

## 2024-02-14 NOTE — Telephone Encounter (Signed)
-----   Message from Lynwood Schilling sent at 02/14/2024 11:07 AM EDT ----- Please send this question about the reflux to PCP.  They can run any suggested meds they would use to make sure it does not interact with any of her meds.  ----- Message ----- From: Sanjuan Aleck SAILOR, RN Sent: 02/14/2024  10:56 AM EDT To: Lynwood Schilling, MD  The patient has been notified of the result and verbalized understanding.  All questions (if any) were answered. Aleck SAILOR Sanjuan, RN 02/14/2024 10:55 AM  Results sent to PCP. Pt is having a lot of trouble with reflux and is taking tykosin and nexium  and is wondering what else she can take for reflux.  ----- Message ----- From: Schilling Lynwood, MD Sent: 02/09/2024   7:54 AM EDT To: Aleck SAILOR Karthik Whittinghill, RN  EF is normal.  No evidence of pulmonary hypertension.  No change in therapy.  No further imaging.  Call Ms. Staff with the results and send results to Milana Sharper, MD  ----- Message ----- From: Interface, [employer] Sent: 02/07/2024   3:48 PM EDT To: Lynwood Schilling, MD

## 2024-02-14 NOTE — Telephone Encounter (Signed)
 Spoke with pt regarding Dr. Denver suggestion to check with PCP. Pt stated that her PCP told her she could take pepcid  however when she looked it up it said not to take it. Pt was told her questions would be sent to our pharmacy team for clarification. Pt verbalized understanding. All questions if any were answered.

## 2024-02-15 ENCOUNTER — Inpatient Hospital Stay: Admitting: Adult Health

## 2024-02-15 ENCOUNTER — Inpatient Hospital Stay

## 2024-02-15 ENCOUNTER — Other Ambulatory Visit: Payer: Self-pay

## 2024-02-21 NOTE — Progress Notes (Signed)
 Ellouise Console, PA-C [address] [place], [place]  [address] Phone: [phone]   Primary Care Physician: Milana Sharper, MD  Primary Gastroenterologist:  Ellouise Console, PA-C / Dr. Gordy Starch   Chief Complaint: Follow-up GERD and IBS-M       HPI:   Lisa Cardenas is a 73 y.o. female, established patient Dr. Starch, returns for follow-up of GERD and irritable bowel syndrome.  She has episodes of constipation alternating with diarrhea.  Currently taking Nexium  40 Mg twice daily.  No current treatment for constipation.  Previously treated with Linzess 72.  Last abdominal x-ray in 04/2022 showed stool throughout the colon.  No obstruction.  Patient states she has had 3 episodes of diarrhea in the past 2 weeks.  Between episodes she has had normal formed stool.  A few months ago she was taking Colace for constipation.  She has associated lower abdominal cramping which comes and goes.  Denies melena, hematochezia, weight loss, or alarm symptoms.  She is on Tikosyn  for arrhythmia.  Is worried about any drug interactions with that medication.  05/2022 last colonoscopy: 4 small (4 mm to 5 mm) tubular adenoma polyps removed.  Moderate sigmoid diverticulosis.  Small internal hemorrhoids.  Good prep.  No dysplasia or malignancy.  3-year repeat (due 05/2025).  05/2022 EGD: 3 cm hiatal hernia.  20 mm hyperplastic gastric polyp removed.  Clip placed.  Mild gastritis.  Otherwise normal esophagus and duodenum.  Biopsies negative for H. pylori.  No dysplasia or malignancy.  PMH: hypertension, hyperlipidemia, atrial fibrillation on Xarelto , hypothyroidism, obesity, essential tremors, hiatal hernia, GERD, chronic constipation, diverticulosis and multiple adenomatous colon polyps.   Current Outpatient Medications  Medication Sig Dispense Refill   anastrozole  (ARIMIDEX ) 1 MG tablet TAKE 1 TABLET BY MOUTH EVERY DAY 90 tablet 3   atorvastatin  (LIPITOR) 20 MG tablet Take 20 mg by mouth daily.      celecoxib  (CELEBREX ) 200 MG capsule Take 200 mg by mouth as needed.     diltiazem  (CARDIZEM  CD) 240 MG 24 hr capsule TAKE 1 CAPSULE BY MOUTH EVERY DAY 90 capsule 3   diltiazem  (CARDIZEM ) 60 MG tablet Take 60 mg by mouth as needed. Prescribed by PCP. Takes for breakthrough AFib     dofetilide  (TIKOSYN ) 500 MCG capsule Take 1 capsule (500 mcg total) by mouth 2 (two) times daily. 60 capsule 6   esomeprazole  (NEXIUM ) 40 MG capsule Take 40 mg by mouth 2 (two) times daily as needed.     levothyroxine  (SYNTHROID ) 137 MCG tablet Take 137 mcg by mouth daily. Current dose 137 mcg daily     LORazepam  (ATIVAN ) 0.5 MG tablet Take 0.5 mg by mouth 2 (two) times daily as needed for anxiety.     olmesartan  (BENICAR ) 20 MG tablet Take 20 mg by mouth daily.     pregabalin  (LYRICA ) 50 MG capsule Take 50 mg by mouth 2 (two) times daily.     rivaroxaban  (XARELTO ) 20 MG TABS tablet Take 20 mg by mouth daily.     sertraline  (ZOLOFT ) 100 MG tablet Take 50 mg by mouth daily.     Vitamin D , Ergocalciferol , (DRISDOL ) 1.25 MG (50000 UNIT) CAPS capsule Take 50,000 Units by mouth once a week.     No current facility-administered medications for this visit.    Allergies as of 02/22/2024 - Review Complete 02/22/2024  Allergen Reaction Noted   Ciprofloxacin  Other (See Comments) 04/23/2022   Hydroxychloroquine  10/26/2019   Topamax [topiramate]  10/19/2018  Past Medical History:  Diagnosis Date   A-fib [facility])    no issues since May 2020   Cancer [facility])    Diverticulosis    Elevated cholesterol    Hypertension    Obesity    Pancreatitis    Thyroid disease    Trigeminal neuralgia     Past Surgical History:  Procedure Laterality Date   ABDOMINAL HYSTERECTOMY  1980   BLADDER REPAIR     BREAST BIOPSY Left 11/2019   BREAST LUMPECTOMY Left 11/23/2019   BREAST LUMPECTOMY WITH RADIOACTIVE SEED AND SENTINEL LYMPH NODE BIOPSY Left 11/23/2019   Procedure: LEFT BREAST LUMPECTOMY WITH RADIOACTIVE SEED AND LEFT AXILLARY  SENTINEL LYMPH NODE BIOPSY;  Surgeon: Ebbie Cough, MD;  Location: [facility];  Service: General;  Laterality: Left;  PEC BLOCK   [facility]     [facility]   [facility]OS[facility]     [facility]   KNEE ARTHROSCOPY Left    TONSILLECTOMY      Review of Systems:    All systems reviewed and negative except where noted in HPI.    Physical Exam:  BP 110/68   Pulse 66   Wt 259 lb 3 oz (117.6 kg)   BMI 44.49 kg/m  No LMP recorded. Patient has had a hysterectomy.  General: Well-nourished, well-developed in no acute distress.  Lungs: Clear to auscultation bilaterally. Non-labored. Heart: Regular rate and rhythm, no murmurs rubs or gallops.  Abdomen: Bowel sounds are normal; Abdomen is Soft; No hepatosplenomegaly, masses or hernias;  No Abdominal Tenderness; No guarding or rebound tenderness. Neuro: Alert and oriented x 3.  Grossly intact.  Psych: Alert and cooperative, normal mood and affect.   Imaging Studies: ECHOCARDIOGRAM COMPLETE Result Date: 02/07/2024    ECHOCARDIOGRAM REPORT   Patient Name:   [facility] Date of Exam: 02/07/2024 Medical Rec #:  [id number]           Height:       64.0 in Accession #:    [id number]          Weight:       257.0 lb Date of Birth:  02/24/1951           BSA:          2.176 m Patient Age:    73 years            BP:           126/74 mmHg Patient Gender: F                   HR:           71 bpm. Exam Location:  [address] Procedure: 2D Echo, 3D Echo, Cardiac Doppler, Color Doppler and Strain Analysis            (Both Spectral and Color Flow Doppler were utilized during            procedure). Indications:    I48.91 Atrial Fibrillation  History:        Patient has prior history of Echocardiogram examinations, most                 recent 07/08/2017. Arrythmias:Atrial Fibrillation; Risk                 Factors:Hypertension and Dyslipidemia.  Sonographer:    Carl Rodgers-Jones RDCS Referring Phys: [id number] JAMES HOCHREIN IMPRESSIONS  1. Left  ventricular ejection fraction, by estimation, is 60 to 65%. Left ventricular ejection fraction by 3D volume  is 62 %. The left ventricle has normal function. The left ventricle has no regional wall motion abnormalities. Left ventricular diastolic  parameters were normal. The average left ventricular global longitudinal strain is -26.4 %. The global longitudinal strain is normal.  2. Right ventricular systolic function is normal. The right ventricular size is normal. There is normal pulmonary artery systolic pressure.  3. The mitral valve is normal in structure. Trivial mitral valve regurgitation. No evidence of mitral stenosis.  4. The aortic valve is normal in structure. Aortic valve regurgitation is not visualized. No aortic stenosis is present.  5. The inferior vena cava is normal in size with greater than 50% respiratory variability, suggesting right atrial pressure of 3 mmHg. FINDINGS  Left Ventricle: Left ventricular ejection fraction, by estimation, is 60 to 65%. Left ventricular ejection fraction by 3D volume is 62 %. The left ventricle has normal function. The left ventricle has no regional wall motion abnormalities. The average left ventricular global longitudinal strain is -26.4 %. Strain was performed and the global longitudinal strain is normal. The left ventricular internal cavity size was normal in size. There is no left ventricular hypertrophy. Left ventricular diastolic parameters were normal. Right Ventricle: The right ventricular size is normal. No increase in right ventricular wall thickness. Right ventricular systolic function is normal. There is normal pulmonary artery systolic pressure. The tricuspid regurgitant velocity is 2.42 m/s, and  with an assumed right atrial pressure of 3 mmHg, the estimated right ventricular systolic pressure is 26.4 mmHg. Left Atrium: Left atrial size was normal in size. Right Atrium: Right atrial size was normal in size. Pericardium: There is no evidence of  pericardial effusion. Mitral Valve: The mitral valve is normal in structure. Trivial mitral valve regurgitation. No evidence of mitral valve stenosis. Tricuspid Valve: The tricuspid valve is normal in structure. Tricuspid valve regurgitation is trivial. No evidence of tricuspid stenosis. The aortic valve is normal in structure. Aortic valve regurgitation is not visualized. No aortic stenosis is present. Pulmonic Valve: The pulmonic valve was normal in structure. Pulmonic valve regurgitation is not visualized. No evidence of pulmonic stenosis. Aorta: The aortic root is normal in size and structure. Venous: The inferior vena cava is normal in size with greater than 50% respiratory variability, suggesting right atrial pressure of 3 mmHg. IAS/Shunts: No atrial level shunt detected by color flow Doppler. Additional Comments: 3D was performed not requiring image post processing on an independent workstation and was normal.  LEFT VENTRICLE PLAX 2D LVIDd:         5.30 cm         Diastology LVIDs:         3.00 cm         LV e' medial:    6.67 cm/s LV PW:         1.00 cm         LV E/e' medial:  13.6 LV IVS:        1.00 cm         LV e' lateral:   11.30 cm/s LVOT diam:     1.90 cm         LV E/e' lateral: 8.0 LV SV:         76 LV SV Index:   35              2D Longitudinal LVOT Area:     2.84 cm        Strain  2D Strain GLS   -26.0 %                                (A4C):                                2D Strain GLS   -25.5 %                                (A3C):                                2D Strain GLS   -27.8 %                                (A2C):                                2D Strain GLS   -26.4 %                                Avg:                                 3D Volume EF                                LV 3D EF:    Left                                             ventricul                                             ar                                             ejection                                              fraction                                             by 3D                                             volume is  62 %.                                 3D Volume EF:                                3D EF:        62 %                                LV EDV:       180 ml                                LV ESV:       68 ml                                LV SV:        112 ml RIGHT VENTRICLE             IVC RV Basal diam:  4.30 cm     IVC diam: 1.10 cm RV S prime:     17.60 cm/s TAPSE (M-mode): 2.4 cm LEFT ATRIUM             Index        RIGHT ATRIUM           Index LA diam:        5.10 cm 2.34 cm/m   RA Area:     10.90 cm LA Vol (A2C):   61.3 ml 28.17 ml/m  RA Volume:   25.40 ml  11.67 ml/m LA Vol (A4C):   63.6 ml 29.22 ml/m LA Biplane Vol: 64.9 ml 29.82 ml/m  AORTIC VALVE AV Area (Vmax):    2.52 cm AV Area (Vmean):   2.48 cm AV Area (VTI):     2.56 cm AV Vmax:           141.00 cm/s AV Vmean:          93.400 cm/s AV VTI:            0.298 m AV Peak Grad:      8.0 mmHg AV Mean Grad:      4.0 mmHg LVOT Vmax:         125.50 cm/s LVOT Vmean:        81.550 cm/s LVOT VTI:          0.270 m LVOT/AV VTI ratio: 0.90  AORTA Ao Root diam: 2.90 cm Ao Asc diam:  3.30 cm MITRAL VALVE               TRICUSPID VALVE MV Area (PHT): 3.59 cm    TR Peak grad:   23.4 mmHg MV Decel Time: 212 msec    TR Vmax:        242.00 cm/s MV E velocity: 90.75 cm/s MV A velocity: 74.25 cm/s  SHUNTS MV E/A ratio:  1.22        Systemic VTI:  0.27 m                            Systemic Diam: 1.90 cm Jerel Croitoru MD Electronically signed by Jerel Balding  MD Signature Date/Time: 02/07/2024/3:48:08 PM    Final     Labs: CBC    Component Value Date/Time   WBC 8.2 11/26/2023 1107   WBC 6.7 08/09/2023 1038   WBC 8.9 12/23/2022 1200   RBC 5.72 (H) 11/26/2023 1107   RBC 5.32 (H) 08/09/2023 1038   HGB 15.2 11/26/2023 1107   HCT 48.4 (H) 11/26/2023 1107   PLT 273  11/26/2023 1107   MCV 85 11/26/2023 1107   MCH 26.6 11/26/2023 1107   MCH 27.4 08/09/2023 1038   MCHC 31.4 (L) 11/26/2023 1107   MCHC 32.7 08/09/2023 1038   RDW 14.8 11/26/2023 1107   LYMPHSABS 1.1 08/09/2023 1038   MONOABS 0.3 08/09/2023 1038   EOSABS 0.1 08/09/2023 1038   BASOSABS 0.0 08/09/2023 1038    CMP     Component Value Date/Time   NA 142 11/26/2023 1106   K 4.8 11/26/2023 1106   CL 104 11/26/2023 1106   CO2 21 11/26/2023 1106   GLUCOSE 112 (H) 11/26/2023 1106   GLUCOSE 112 (H) 11/19/2023 0406   BUN 17 11/26/2023 1106   CREATININE 0.84 11/26/2023 1106   CREATININE 0.74 08/09/2023 1038   CALCIUM  10.0 11/26/2023 1106   PROT 6.7 11/11/2023 1100   ALBUMIN 4.4 11/11/2023 1100   AST 13 11/11/2023 1100   AST 11 (L) 08/09/2023 1038   ALT 20 11/11/2023 1100   ALT 19 08/09/2023 1038   ALKPHOS 105 11/11/2023 1100   BILITOT 0.4 11/11/2023 1100   BILITOT 0.5 08/09/2023 1038   GFRNONAA >60 11/19/2023 0406   GFRNONAA >60 08/09/2023 1038   GFRAA >60 11/20/2019 1100   GFRAA >60 10/26/2019 1507       Assessment and Plan:   Abrish Erny is a 73 y.o. y/o female presents for follow-up of:  1.  Chronic GERD: She has needed high-dose PPI Nexium  40 Mg twice daily to control her reflux. - We discussed adverse side effects of PPIs to include vitamin deficiencies, osteoporosis, renal insufficiency..  Recommend take lowest effective dose of PPI necessary to control acid reflux.   - I am recommending decrease Nexium  to 40 Mg once daily every morning 30 minutes before breakfast, #90, 3 refills. - Start famotidine  40 Mg once daily in the evening before dinner or bedtime, #90, 3 refills. - Recommend Lifestyle Modifications to prevent Acid Reflux.  Rec. Avoid coffee, sodas, peppermint, garlic, onions, alcohol, citrus fruits, chocolate, tomatoes, fatty and spicey foods.  Avoid eating 2-3 hours before bedtime.    2.  Irritable bowel syndrome.  Has diarrhea, constipation, and low  abd cramping. - Start Metamucil daily.  Mix 1 packet in 8 ounces of liquid every day.  Do not take Metamucil within 2 hours of other medications. - Start Rx hyoscyamine  0.125 Mg 1 tab 3 times daily as needed abdominal cramping, #90, 5 refills. - Reassurance no medication interactions.  3.  History of adenomatous colon polyps - 3-year repeat colonoscopy will be due 05/2025.  Ellouise Console, PA-C  Follow up in 4 months with TG for follow-up of IBS and GERD.  Sooner if worsening GI symptoms.

## 2024-02-22 ENCOUNTER — Ambulatory Visit (INDEPENDENT_AMBULATORY_CARE_PROVIDER_SITE_OTHER): Admitting: Physician Assistant

## 2024-02-22 ENCOUNTER — Encounter: Payer: Self-pay | Admitting: Physician Assistant

## 2024-02-22 VITALS — BP 110/68 | HR 66 | Wt 259.2 lb

## 2024-02-22 DIAGNOSIS — K219 Gastro-esophageal reflux disease without esophagitis: Secondary | ICD-10-CM

## 2024-02-22 DIAGNOSIS — K582 Mixed irritable bowel syndrome: Secondary | ICD-10-CM | POA: Diagnosis not present

## 2024-02-22 DIAGNOSIS — Z860101 Personal history of adenomatous and serrated colon polyps: Secondary | ICD-10-CM

## 2024-02-22 MED ORDER — FAMOTIDINE 40 MG PO TABS: 40.0000 mg | ORAL_TABLET | Freq: Every day | ORAL | 3 refills | Status: AC

## 2024-02-22 MED ORDER — HYOSCYAMINE SULFATE 0.125 MG PO TABS: 0.1250 mg | ORAL_TABLET | Freq: Three times a day (TID) | ORAL | 5 refills | Status: DC | PRN

## 2024-02-22 MED ORDER — ESOMEPRAZOLE MAGNESIUM 40 MG PO CPDR: 40.0000 mg | DELAYED_RELEASE_CAPSULE | Freq: Every day | ORAL | 3 refills | Status: AC

## 2024-02-22 NOTE — Patient Instructions (Addendum)
 For Acid Reflux: Take Nexium  40 Mg 1 tablet once daily 30 minutes before breakfast. Take famotidine  40 mg 1 tablet once daily before dinner or bedtime.  For irritable bowel syndrome with diarrhea and constipation: 1.  Start Metamucil daily.  Mix 1 dose in 8 ounces of liquid once daily.  Do not take Metamucil within 2 hours of other medications. 2.  Try prescription hyoscyamine  0.125 Mg 1 tablet 3 times daily as needed for lower abdominal cramping.  Please follow up sooner if symptoms increase or worsen  Due to recent changes in healthcare laws, you may see the results of your imaging and laboratory studies on MyChart before your provider has had a chance to review them.  We understand that in some cases there may be results that are confusing or concerning to you. Not all laboratory results come back in the same time frame and the provider may be waiting for multiple results in order to interpret others.  Please give us  48 hours in order for your provider to thoroughly review all the results before contacting the office for clarification of your results.   Thank you for trusting me with your gastrointestinal care!   Ellouise Console, PA-C _______________________________________________________  If your blood pressure at your visit was 140/90 or greater, please contact your primary care physician to follow up on this.  _______________________________________________________  If you are age 70 or older, your body mass index should be between 23-30. Your Body mass index is 44.49 kg/m. If this is out of the aforementioned range listed, please consider follow up with your Primary Care Provider.  If you are age 60 or younger, your body mass index should be between 19-25. Your Body mass index is 44.49 kg/m. If this is out of the aformentioned range listed, please consider follow up with your Primary Care Provider.   ________________________________________________________  The  GI providers  would like to encourage you to use MYCHART to communicate with providers for non-urgent requests or questions.  Due to long hold times on the telephone, sending your provider a message by [facility] may be a faster and more efficient way to get a response.  Please allow 48 business hours for a response.  Please remember that this is for non-urgent requests.  _______________________________________________________'

## 2024-02-23 LAB — BASIC METABOLIC PANEL WITH GFR
BUN/Creatinine Ratio: 16 (ref 12–28)
BUN: 12 mg/dL (ref 8–27)
CO2: 23 mmol/L (ref 20–29)
Calcium: 9.9 mg/dL (ref 8.7–10.3)
Chloride: 101 mmol/L (ref 96–106)
Creatinine, Ser: 0.74 mg/dL (ref 0.57–1.00)
Glucose: 93 mg/dL (ref 70–99)
Potassium: 4.3 mmol/L (ref 3.5–5.2)
Sodium: 141 mmol/L (ref 134–144)
eGFR: 86 mL/min/1.73 (ref >59)

## 2024-02-23 LAB — MAGNESIUM: Magnesium: 2 mg/dL (ref 1.6–2.3)

## 2024-02-28 ENCOUNTER — Inpatient Hospital Stay (HOSPITAL_BASED_OUTPATIENT_CLINIC_OR_DEPARTMENT_OTHER): Admitting: Adult Health

## 2024-02-28 ENCOUNTER — Inpatient Hospital Stay: Attending: Adult Health

## 2024-02-28 ENCOUNTER — Encounter: Payer: Self-pay | Admitting: Adult Health

## 2024-02-28 VITALS — BP 132/63 | HR 71 | Temp 98.1°F | Resp 18 | Wt 257.9 lb

## 2024-02-28 DIAGNOSIS — Z808 Family history of malignant neoplasm of other organs or systems: Secondary | ICD-10-CM | POA: Insufficient documentation

## 2024-02-28 DIAGNOSIS — Z170 Estrogen receptor positive status [ER+]: Secondary | ICD-10-CM

## 2024-02-28 DIAGNOSIS — Z8616 Personal history of COVID-19: Secondary | ICD-10-CM | POA: Insufficient documentation

## 2024-02-28 DIAGNOSIS — Z79811 Long term (current) use of aromatase inhibitors: Secondary | ICD-10-CM | POA: Insufficient documentation

## 2024-02-28 DIAGNOSIS — E119 Type 2 diabetes mellitus without complications: Secondary | ICD-10-CM | POA: Diagnosis not present

## 2024-02-28 DIAGNOSIS — R911 Solitary pulmonary nodule: Secondary | ICD-10-CM | POA: Insufficient documentation

## 2024-02-28 DIAGNOSIS — Z806 Family history of leukemia: Secondary | ICD-10-CM | POA: Diagnosis not present

## 2024-02-28 DIAGNOSIS — Z79899 Other long term (current) drug therapy: Secondary | ICD-10-CM | POA: Diagnosis not present

## 2024-02-28 DIAGNOSIS — C50412 Malignant neoplasm of upper-outer quadrant of left female breast: Secondary | ICD-10-CM | POA: Insufficient documentation

## 2024-02-28 DIAGNOSIS — R918 Other nonspecific abnormal finding of lung field: Secondary | ICD-10-CM

## 2024-02-28 DIAGNOSIS — Z803 Family history of malignant neoplasm of breast: Secondary | ICD-10-CM | POA: Insufficient documentation

## 2024-02-28 DIAGNOSIS — Z800 Family history of malignant neoplasm of digestive organs: Secondary | ICD-10-CM | POA: Diagnosis not present

## 2024-02-28 DIAGNOSIS — I4891 Unspecified atrial fibrillation: Secondary | ICD-10-CM | POA: Insufficient documentation

## 2024-02-28 LAB — CMP (CANCER CENTER ONLY)
ALT: 19 U/L (ref 0–44)
AST: 12 U/L — ABNORMAL LOW (ref 15–41)
Albumin: 4.3 g/dL (ref 3.5–5.0)
Alkaline Phosphatase: 84 U/L (ref 38–126)
Anion gap: 8 (ref 5–15)
BUN: 13 mg/dL (ref 8–23)
CO2: 30 mmol/L (ref 22–32)
Calcium: 10.1 mg/dL (ref 8.9–10.3)
Chloride: 103 mmol/L (ref 98–111)
Creatinine: 0.78 mg/dL (ref 0.44–1.00)
GFR, Estimated: >60 mL/min (ref >60)
Glucose, Bld: 116 mg/dL — ABNORMAL HIGH (ref 70–99)
Potassium: 4.2 mmol/L (ref 3.5–5.1)
Sodium: 141 mmol/L (ref 135–145)
Total Bilirubin: 0.6 mg/dL (ref 0.0–1.2)
Total Protein: 7 g/dL (ref 6.5–8.1)

## 2024-02-28 LAB — CBC WITH DIFFERENTIAL (CANCER CENTER ONLY)
Abs Immature Granulocytes: 0.02 K/uL (ref 0.00–0.07)
Basophils Absolute: 0.1 K/uL (ref 0.0–0.1)
Basophils Relative: 1 %
Eosinophils Absolute: 0.2 K/uL (ref 0.0–0.5)
Eosinophils Relative: 3 %
HCT: 42.2 % (ref 36.0–46.0)
Hemoglobin: 13.8 g/dL (ref 12.0–15.0)
Immature Granulocytes: 0 %
Lymphocytes Relative: 19 %
Lymphs Abs: 1.2 K/uL (ref 0.7–4.0)
MCH: 26.8 pg (ref 26.0–34.0)
MCHC: 32.7 g/dL (ref 30.0–36.0)
MCV: 82.1 fL (ref 80.0–100.0)
Monocytes Absolute: 0.4 K/uL (ref 0.1–1.0)
Monocytes Relative: 6 %
Neutro Abs: 4.5 K/uL (ref 1.7–7.7)
Neutrophils Relative %: 71 %
Platelet Count: 210 K/uL (ref 150–400)
RBC: 5.14 MIL/uL — ABNORMAL HIGH (ref 3.87–5.11)
RDW: 15.3 % (ref 11.5–15.5)
WBC Count: 6.3 K/uL (ref 4.0–10.5)
nRBC: 0 % (ref 0.0–0.2)

## 2024-02-28 NOTE — Progress Notes (Unsigned)
 [facility] Cancer Follow up:    Lisa Sharper, MD [address] [place] [address]   DIAGNOSIS: Cancer Staging  Malignant neoplasm of upper-outer quadrant of left breast in female, estrogen receptor positive (HCC) Staging form: Breast, AJCC 8th Edition - Clinical: Stage IA (cT1a, cN0, cM0, G2, ER+, PR+, HER2-) - Signed by Crawford Morna Pickle, NP on 11/01/2019 Stage prefix: Initial diagnosis Histologic grading system: 3 grade system - Pathologic stage from 11/23/2019: Stage IA (pT1b, pN0, cM0, G2, ER+, PR+, HER2-) - Signed by Crawford Morna Pickle, NP on 12/13/2019 Stage prefix: Initial diagnosis Histologic grading system: 3 grade system    SUMMARY OF ONCOLOGIC HISTORY: Oncology History  Malignant neoplasm of upper-outer quadrant of left breast in female, estrogen receptor positive (HCC)  10/26/2019 Initial Diagnosis   Malignant neoplasm of upper-outer quadrant of left breast in female, estrogen receptor positive (HCC)   11/01/2019 Cancer Staging   Staging form: Breast, AJCC 8th Edition - Clinical: Stage IA (cT1a, cN0, cM0, G2, ER+, PR+, HER2-) - Signed by Crawford Morna Pickle, NP on 11/01/2019   11/03/2019 Genetic Testing   Negative genetic testing. No pathogenic variants identified on the [facility] Breast Cancer STAT Panel + Common Hereditary Cancers Panel. The report date is 11/03/2019.  The STAT Breast cancer panel offered by [facility] includes sequencing and rearrangement analysis for the following 9 genes:  ATM, BRCA1, BRCA2, CDH1, CHEK2, PALB2, PTEN, STK11 and TP53.    The Common Hereditary Cancers Panel offered by [facility] includes sequencing and/or deletion duplication testing of the following 48 genes: APC, ATM, AXIN2, BARD1, BMPR1A, BRCA1, BRCA2, BRIP1, CDH1, CDKN2A (p14ARF), CDKN2A (p16INK4a), CKD4, CHEK2, CTNNA1, DICER1, EPCAM (Deletion/duplication testing only), GREM1 (promoter region deletion/duplication testing only), KIT, MEN1, MLH1, MSH2,  MSH3, MSH6, MUTYH, NBN, NF1, NHTL1, PALB2, PDGFRA, PMS2, POLD1, POLE, PTEN, RAD50, RAD51C, RAD51D, RNF43, SDHB, SDHC, SDHD, SMAD4, SMARCA4. STK11, TP53, TSC1, TSC2, and VHL.  The following genes were evaluated for sequence changes only: SDHA and HOXB13 c.251G>A variant only.   11/23/2019 Cancer Staging   Staging form: Breast, AJCC 8th Edition - Pathologic stage from 11/23/2019: Stage IA (pT1b, pN0, cM0, G2, ER+, PR+, HER2-) - Signed by Crawford Morna Pickle, NP on 12/13/2019   11/23/2019 Surgery   status post left lumpectomy and sentinel lymph node sampling for a pT1b pN0, stage IA invasive lobular carcinoma, with negative margins    11/23/2019 Oncotype testing   18/5%   01/02/2020 - 01/24/2020 Radiation Therapy   Site Technique Total Dose (Gy) Dose per Fx (Gy) Completed Fx Beam Energies  Breast, Left: Breast_Lt 3D 42.56/42.56 2.66 16/16 10X, 15X     02/2020 -  Anti-estrogen oral therapy   Anastrozole      CURRENT THERAPY:  INTERVAL HISTORY:  Discussed the use of AI scribe software for clinical note transcription with the patient, who gave verbal consent to proceed.  Lisa Cardenas 73 y.o. female returns for    Patient Active Problem List   Diagnosis Date Noted  . OSA (obstructive sleep apnea) 12/23/2023  . Atypical atrial flutter (HCC) 11/16/2023  . High risk medications (not anticoagulants) long-term use 08/04/2023  . Hypercoagulable state due to paroxysmal atrial fibrillation (HCC) 11/23/2022  . Essential hypertension 10/25/2022  . Trigeminal neuralgia of left side of face 01/26/2020  . Genetic testing 11/07/2019  . Malignant neoplasm of upper-outer quadrant of left breast in female, estrogen receptor positive (HCC) 10/26/2019  . Atrial fibrillation (HCC) 10/26/2019  . Chronic anticoagulation 10/26/2019  . Morbid obesity  with BMI of 40.0-44.9, adult (HCC) 10/26/2019  . Family history of breast cancer   . Family history of pancreatic cancer   . Family history of prostate  cancer   . Family history of melanoma     is allergic to ciprofloxacin , hydroxychloroquine, and topamax [topiramate].  MEDICAL HISTORY: Past Medical History:  Diagnosis Date  . A-fib [facility])    no issues since May 2020  . Cancer (HCC)   . Diverticulosis   . Elevated cholesterol   . Hypertension   . Obesity   . Pancreatitis   . Thyroid disease   . Trigeminal neuralgia     SURGICAL HISTORY: Past Surgical History:  Procedure Laterality Date  . ABDOMINAL HYSTERECTOMY  1980  . BLADDER REPAIR    . BREAST BIOPSY Left 11/2019  . BREAST LUMPECTOMY Left 11/23/2019  . BREAST LUMPECTOMY WITH RADIOACTIVE SEED AND SENTINEL LYMPH NODE BIOPSY Left 11/23/2019   Procedure: LEFT BREAST LUMPECTOMY WITH RADIOACTIVE SEED AND LEFT AXILLARY SENTINEL LYMPH NODE BIOPSY;  Surgeon: Ebbie Cough, MD;  Location: [facility];  Service: General;  Laterality: Left;  PEC BLOCK  . [facility]     [facility]  . KNEE ARTHROSCOPY Left   . TONSILLECTOMY      SOCIAL HISTORY: Social History   Socioeconomic History  . Marital status: Married    Spouse name: Not on file  . Number of children: 3  . Years of education: Not on file  . Highest education level: Not on file  Occupational History  . Not on file  Tobacco Use  . Smoking status: Never  . Smokeless tobacco: Never  . Tobacco comments:    Never smoked 11/16/23  Vaping Use  . Vaping status: Never Used  Substance and Sexual Activity  . Alcohol use: Not Currently  . Drug use: Not Currently  . Sexual activity: Not on file  Other Topics Concern  . Not on file  Social History Narrative  . Not on file   Social Drivers of Health   Financial Resource Strain: Not on file  Food Insecurity: No Food Insecurity (11/16/2023)   Hunger Vital Sign   . Worried About Programme researcher, broadcasting/film/video in the Last Year: Never true   . Ran Out of Food in the Last Year: Never true  Transportation Needs: No Transportation Needs  (11/16/2023)   PRAPARE - Transportation   . Lack of Transportation (Medical): No   . Lack of Transportation (Non-Medical): No  Physical Activity: Not on file  Stress: Not on file  Social Connections: Unknown (11/16/2023)   Social Connection and Isolation Panel   . Frequency of Communication with Friends and Family: Three times a week   . Frequency of Social Gatherings with Friends and Family: Three times a week   . Attends Religious Services: More than 4 times per year   . Active Member of Clubs or Organizations: No   . Attends Banker Meetings: Patient declined   . Marital Status: Patient declined  Intimate Partner Violence: Not At Risk (11/16/2023)   Humiliation, Afraid, Rape, and Kick questionnaire   . Fear of Current or Ex-Partner: No   . Emotionally Abused: No   . Physically Abused: No   . Sexually Abused: No    FAMILY HISTORY: Family History  Problem Relation Age of Onset  . Colon cancer Mother        dx 69  . Breast cancer Maternal Grandmother   .  Breast cancer Maternal Aunt   . Prostate cancer Maternal Uncle   . Breast cancer Paternal Aunt   . Melanoma Paternal Uncle   . Breast cancer Maternal Aunt   . Leukemia Maternal Uncle   . Breast cancer Paternal Aunt   . Colon cancer Paternal Aunt   . Melanoma Daughter     Review of Systems  Constitutional:  Negative for appetite change, chills, fatigue, fever and unexpected weight change.  HENT:   Negative for hearing loss, lump/mass and trouble swallowing.   Eyes:  Negative for eye problems and icterus.  Respiratory:  Negative for chest tightness, cough and shortness of breath.   Cardiovascular:  Negative for chest pain, leg swelling and palpitations.  Gastrointestinal:  Negative for abdominal distention, abdominal pain, constipation, diarrhea, nausea and vomiting.  Endocrine: Negative for hot flashes.  Genitourinary:  Negative for difficulty urinating.   Musculoskeletal:  Negative for arthralgias.  Skin:   Negative for itching and rash.  Neurological:  Negative for dizziness, extremity weakness, headaches and numbness.  Hematological:  Negative for adenopathy. Does not bruise/bleed easily.  Psychiatric/Behavioral:  Negative for depression. The patient is not nervous/anxious.       PHYSICAL EXAMINATION   Onc Performance Status - 02/28/24 1200       KPS SCALE   KPS % SCORE Able to carry on normal activity, minor s/s of disease          Vitals:   02/28/24 1159  BP: 132/63  Pulse: 71  Resp: 18  Temp: 98.1 F (36.7 C)  SpO2: 93%    Physical Exam Constitutional:      General: She is not in acute distress.    Appearance: Normal appearance. She is not toxic-appearing.  HENT:     Head: Normocephalic and atraumatic.     Mouth/Throat:     Mouth: Mucous membranes are moist.     Pharynx: Oropharynx is clear. No oropharyngeal exudate or posterior oropharyngeal erythema.  Eyes:     General: No scleral icterus. Cardiovascular:     Rate and Rhythm: Normal rate and regular rhythm.     Pulses: Normal pulses.     Heart sounds: Normal heart sounds.  Pulmonary:     Effort: Pulmonary effort is normal.     Breath sounds: Normal breath sounds.  Abdominal:     General: Abdomen is flat. Bowel sounds are normal. There is no distension.     Palpations: Abdomen is soft.     Tenderness: There is no abdominal tenderness.  Musculoskeletal:        General: No swelling.     Cervical back: Neck supple.  Lymphadenopathy:     Cervical: No cervical adenopathy.  Skin:    General: Skin is warm and dry.     Findings: No rash.  Neurological:     General: No focal deficit present.     Mental Status: She is alert.  Psychiatric:        Mood and Affect: Mood normal.        Behavior: Behavior normal.     LABORATORY DATA:  CBC    Component Value Date/Time   WBC 6.3 02/28/2024 1108   WBC 8.9 12/23/2022 1200   RBC 5.14 (H) 02/28/2024 1108   HGB 13.8 02/28/2024 1108   HGB 15.2 11/26/2023 1107    HCT 42.2 02/28/2024 1108   HCT 48.4 (H) 11/26/2023 1107   PLT 210 02/28/2024 1108   PLT 273 11/26/2023 1107   MCV 82.1 02/28/2024 1108  MCV 85 11/26/2023 1107   MCH 26.8 02/28/2024 1108   MCHC 32.7 02/28/2024 1108   RDW 15.3 02/28/2024 1108   RDW 14.8 11/26/2023 1107   LYMPHSABS 1.2 02/28/2024 1108   MONOABS 0.4 02/28/2024 1108   EOSABS 0.2 02/28/2024 1108   BASOSABS 0.1 02/28/2024 1108    CMP     Component Value Date/Time   NA 141 02/28/2024 1108   NA 141 02/22/2024 1202   K 4.2 02/28/2024 1108   CL 103 02/28/2024 1108   CO2 30 02/28/2024 1108   GLUCOSE 116 (H) 02/28/2024 1108   BUN 13 02/28/2024 1108   BUN 12 02/22/2024 1202   CREATININE 0.78 02/28/2024 1108   CALCIUM  10.1 02/28/2024 1108   PROT 7.0 02/28/2024 1108   PROT 6.7 11/11/2023 1100   ALBUMIN 4.3 02/28/2024 1108   ALBUMIN 4.4 11/11/2023 1100   AST 12 (L) 02/28/2024 1108   ALT 19 02/28/2024 1108   ALKPHOS 84 02/28/2024 1108   BILITOT 0.6 02/28/2024 1108   GFRNONAA >60 02/28/2024 1108   GFRAA >60 11/20/2019 1100   GFRAA >60 10/26/2019 1507     ASSESSMENT and THERAPY PLAN:   No problem-specific Assessment & Plan notes found for this encounter.     All questions were answered. The patient knows to call the clinic with any problems, questions or concerns. We can certainly see the patient much sooner if necessary.  Total encounter time:*** minutes*in face-to-face visit time, chart review, lab review, care coordination, order entry, and documentation of the encounter time.    Morna Kendall, NP 02/28/24 12:14 PM Medical Oncology and Hematology [facility] [address] [place], [place] [address] Tel. [phone]    Fax. [phone]  *Total Encounter Time as defined by the Centers for Medicare and Medicaid Services includes, in addition to the face-to-face time of a patient visit (documented in the note above) non-face-to-face time: obtaining and reviewing outside history, ordering  and reviewing medications, tests or procedures, care coordination (communications with other health care professionals or caregivers) and documentation in the medical record.

## 2024-03-01 ENCOUNTER — Telehealth: Payer: Self-pay | Admitting: *Deleted

## 2024-03-01 NOTE — Telephone Encounter (Signed)
 Per Morna Kendall, NP, called to make pt aware that CT high res for week of 05/15/2024. That imaging will evaluate lungs and the changes in the tissues of the lungs that was noted on her recent CT, it will also follow the lung nodule. Pt verbalized understanding

## 2024-03-07 ENCOUNTER — Encounter (INDEPENDENT_AMBULATORY_CARE_PROVIDER_SITE_OTHER): Payer: Self-pay | Admitting: Family Medicine

## 2024-03-07 ENCOUNTER — Ambulatory Visit (INDEPENDENT_AMBULATORY_CARE_PROVIDER_SITE_OTHER): Admitting: Family Medicine

## 2024-03-07 VITALS — BP 118/71 | HR 70 | Temp 98.2°F | Ht 64.0 in | Wt 254.0 lb

## 2024-03-07 DIAGNOSIS — Z6841 Body Mass Index (BMI) 40.0 and over, adult: Secondary | ICD-10-CM

## 2024-03-07 DIAGNOSIS — D6869 Other thrombophilia: Secondary | ICD-10-CM

## 2024-03-07 DIAGNOSIS — Z170 Estrogen receptor positive status [ER+]: Secondary | ICD-10-CM | POA: Diagnosis not present

## 2024-03-07 DIAGNOSIS — D6859 Other primary thrombophilia: Secondary | ICD-10-CM

## 2024-03-07 DIAGNOSIS — I480 Paroxysmal atrial fibrillation: Secondary | ICD-10-CM | POA: Diagnosis not present

## 2024-03-07 DIAGNOSIS — Z0289 Encounter for other administrative examinations: Secondary | ICD-10-CM

## 2024-03-07 DIAGNOSIS — C50412 Malignant neoplasm of upper-outer quadrant of left female breast: Secondary | ICD-10-CM | POA: Diagnosis not present

## 2024-03-07 DIAGNOSIS — I10 Essential (primary) hypertension: Secondary | ICD-10-CM

## 2024-03-07 NOTE — Progress Notes (Signed)
 Lisa DOROTHA Jenkins, DO, ABFM, ABOM Bariatric physician [address] [place], [place], [place] [address] Office: [phone]  /  Fax: [phone]     Initial Evaluation:  Lisa Cardenas was seen in clinic today to evaluate for obesity. She is interested in losing weight to improve overall health and reduce the risk of weight related complications. She presents today to review program treatment options, initial physical assessment, and evaluation.     She was referred by: Specialist (Dr. Lavona)  When asked what they hope to accomplish? She states: improve existing medical conditions, help with accountability, reduce number of medications, reduce risk for surgery and improve quality of life.   When asked how has your weight affected you? She states: Has affected self-esteem, contributed to medical problems, having poor endurance, decreased socialization.   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  Some associated conditions: Hypertension, Arthritis:knee, Hyperlipidemia, OSA, Prediabetes, GERD, Heart disease. She also mentions having a h/o pancreatitis.   Current nutrition plan: None  Current level of physical activity: None  Current or previous pharmacotherapy: None  Response to medication: Never tried medications   Past Medical History:  Diagnosis Date   A-fib [facility])    no issues since May 2020   Cancer [facility])    Diverticulosis    Elevated cholesterol    Hypertension    Obesity    Pancreatitis    Thyroid disease    Trigeminal neuralgia     Current Outpatient Medications  Medication Instructions   anastrozole  (ARIMIDEX ) 1 mg, Oral, Daily   atorvastatin  (LIPITOR) 20 mg, Daily   celecoxib  (CELEBREX ) 200 mg, As needed   diltiazem  (CARDIZEM  CD) 240 MG 24 hr capsule Oral, Daily   diltiazem  (CARDIZEM ) 60 mg, As needed   dofetilide   (TIKOSYN ) 500 mcg, Oral, 2 times daily   esomeprazole  (NEXIUM ) 40 mg, Oral, Daily before breakfast   famotidine  (PEPCID ) 40 mg, Oral, Daily at bedtime   hyoscyamine  (LEVSIN ) 0.125 mg, Oral, 3 times daily PRN   levothyroxine  (SYNTHROID ) 137 mcg, Daily   LORazepam  (ATIVAN ) 0.5 mg, 2 times daily PRN   olmesartan  (BENICAR ) 20 mg, Daily   pregabalin  (LYRICA ) 50 mg, 2 times daily   rivaroxaban  (XARELTO ) 20 mg, Daily   sertraline  (ZOLOFT ) 50 mg, Daily   TRELEGY ELLIPTA 100-62.5-25 MCG/ACT AEPB 1 puff, Daily   Vitamin D  (Ergocalciferol ) (DRISDOL ) 50,000 Units, Weekly     Allergies  Allergen Reactions   Ciprofloxacin  Other (See Comments)   Hydroxychloroquine     Increased heart rate  Other Reaction(s): Not available   Topamax [Topiramate]     Other Reaction(s): other     Past Surgical History:  Procedure Laterality Date   ABDOMINAL HYSTERECTOMY  1980   BLADDER REPAIR     BREAST BIOPSY Left 11/2019   BREAST LUMPECTOMY Left 11/23/2019   BREAST LUMPECTOMY WITH RADIOACTIVE SEED AND SENTINEL LYMPH NODE BIOPSY Left 11/23/2019   Procedure: LEFT BREAST LUMPECTOMY WITH RADIOACTIVE SEED AND LEFT AXILLARY SENTINEL LYMPH NODE BIOPSY;  Surgeon: Ebbie Cough, MD;  Location: [facility];  Service: General;  Laterality: Left;  PEC BLOCK   [facility]   [facility]     [facility]   KNEE ARTHROSCOPY Left    TONSILLECTOMY       Family History  Problem Relation Age of Onset   Colon cancer Mother  dx 63   Breast cancer Maternal Grandmother    Breast cancer Maternal Aunt    Prostate cancer Maternal Uncle    Breast cancer Paternal Aunt    Melanoma Paternal Uncle    Breast cancer Maternal Aunt    Leukemia Maternal Uncle    Breast cancer Paternal Aunt    Colon cancer Paternal Aunt    Melanoma Daughter      Objective:  There were no vitals taken for this visit. She was weighed on the bioimpedance scale: There is no height or weight on file to calculate  BMI.  Visceral Fat rating : 20, Body Fat %: 54.2  Weight Lost Since Last Visit: NA  Weight Gained Since Last Visit: NA   No data recorded Anthropometric Measurements Weight at Last Visit: NA Weight Lost Since Last Visit: NA Weight Gained Since Last Visit: NA Starting Weight: NA Total Weight Loss (lbs): -- (NA) Waist Measurement : -- (NA)   No data recorded Other Clinical Data A1c: -- (NA) RMR: -- (NA) Fasting: no Labs: no Today's Visit #: Consult Starting Date: -- (NA) Comments: Consult    General: Well Developed, well nourished, and in no acute distress.  HEENT: Normocephalic, atraumatic; EOMI, sclerae are anicteric. Skin: Warm and dry, good turgor Chest:  Normal excursion, shape, no gross ABN Respiratory: No conversational dyspnea; speaking in full sentences NeuroM-Sk:  Normal gross ROM * 4 extremities  Psych: A and O *3, insight adequate, mood- full    Assessment and Plan:    FOR THE DISEASE OF OBESITY:  Morbid obesity with BMI of 40.0-44.9, adult [facility]) Assessment & Plan: We reviewed anthropometrics, biometrics, associated medical conditions and contributing factors with patient. Lisa Cardenas would benefit from a medically tailored reduced calorie nutrional plan based on their REE (resting energy expenditure), which will be determined by indirect calorimetry.  We will also assess for cardiometabolic risk and nutritional derangements via fasting labs at intake appointment.    Obesity Treatment / Action Plan:   she was weighed on the bioimpedance scale and results were discussed and documented in the synopsis.   Lisa Cardenas will complete provided nutritional and psychosocial assessment questionnaire before the next appointment.  she will be scheduled for indirect calorimetry to determine resting energy expenditure in a fasting state.  This will allow us  to create a reduced calorie, high-protein meal plan to promote loss of fat mass while preserving muscle  mass.  We will also assess for cardiometabolic risk and nutritional derangements via an ECG and fasting serologies at her next appointment.  she was encouraged to work on amassing support from family and friends to begin their weight loss journey.   Work on eliminating or reducing the presence of highly processed, poorly nutritious, calorie-dense foods in the home.   Obesity Education Performed Today:  Patient was counseled on nutritional approaches to weight loss and benefits of reducing processed foods and consuming plant-based foods and high quality protein as part of nutritional weight management program.   We discussed the importance of long term lifestyle changes which include nutrition, exercise and behavioral modifications as well as the importance of customizing this to her specific health and social needs.   We discussed the benefits of reaching a healthier weight to alleviate the symptoms of existing conditions and reduce the risks of the biomechanical, metabolic and psychological effects of obesity.  Was counseled on the health benefits of losing 5%-10% of total body weight.  Was counseled on our cognitive behavorial therapy program, lead  by our bariatric psychologist, who focuses on emotional eating and creating positive behavorial change.  Was counseled on bariatric pharmacotherapy and how this may be used as an adjunct in their weight management    Lisa Cardenas appears to be in the action stage of change and states they are ready to start intensive lifestyle modifications and behavioral modifications.  It was recommended that she follow up in the next 1-2 weeks to review the above steps, and to continue with treatment of their chronic disease state of obesity   FOR OTHER CONDITIONS RELATED TO THE DISEASE OF OBESITY:   Malignant neoplasm of upper-outer quadrant of left breast in female, estrogen receptor positive (HCC) Assessment & Plan: She has a history of stage one A ERPR  positive left breast invasive lobular carcinoma diagnosed in 2021. She is status post lumpectomy and adjuvant radiation therapy. She is currently on anastrozole  1 mg daily. Discussed that excess adiposity can lead to increased risks of several types of cancers. Cont treatment per oncology. Of note, she is also being monitored by them for a persistent dry cough and lung nodule.     Hypercoagulable state due to paroxysmal atrial fibrillation [facility]) Essential hypertension Assessment & Plan: BP Readings from Last 3 Encounters:  03/07/24 118/71  02/28/24 132/63  02/22/24 110/68   The ASCVD Risk score (Arnett DK, et al., 2019) failed to calculate for the following reasons:   Cannot find a previous HDL lab   Cannot find a previous total cholesterol lab  Lab Results  Component Value Date   CREATININE 0.78 02/28/2024   She has a history of HTN and atrial fibrillation. She was diagnosed with HTN back in 1986 and is on Olmesartan  20 mg daily. BP at goal today. She was initially diagnosed with A-FIB in 2019; she is on Tikosyn  and Cardizem . She is asx today. No acute concerns. Of note ECHO dated 02/07/2024 showed LVEF of 60 to 65%. She will continue all treatment per cardiology. She would benefit from a low-cholesterol heart healthy meal plan.    Attestations:   I, Special Puri, acting as a Stage manager for [employer], DO., have compiled all relevant documentation for today's office visit on behalf of Lisa Jenkins, DO, while in the presence of [employer], DO.  I have spent 59 minutes in the care of the patient today including 49 minutes was spent in face to face counseling of the patient on the disease of obesity and what our program can do for their medical conditions as well as in preventing future diseases. I discussed the importance of comprehensive care in the treatment of obesity including mental well being and physical activity.   I have reviewed the above documentation for  accuracy and completeness, and I agree with the above. Lisa JINNY Cardenas, D.O.  The 21st Century Cures Act was signed into law in 2016 which includes the topic of electronic health records.  This provides immediate access to information in MyChart.  This includes consultation notes, operative notes, office notes, lab results and pathology reports.  If you have any questions about what you read please let us  know at your next visit so we can discuss your concerns and take corrective action if need be.  We are right here with you!

## 2024-03-27 ENCOUNTER — Encounter: Payer: Self-pay | Admitting: Adult Health

## 2024-03-31 ENCOUNTER — Ambulatory Visit (HOSPITAL_COMMUNITY)
Admission: RE | Admit: 2024-03-31 | Discharge: 2024-03-31 | Disposition: A | Discharge status: Home / Self Care | Source: Ambulatory Visit | Attending: Internal Medicine | Admitting: Internal Medicine

## 2024-03-31 ENCOUNTER — Encounter (HOSPITAL_COMMUNITY): Payer: Self-pay | Admitting: Internal Medicine

## 2024-03-31 VITALS — BP 122/68 | HR 68 | Ht 64.0 in | Wt 256.0 lb

## 2024-03-31 DIAGNOSIS — Z5181 Encounter for therapeutic drug level monitoring: Secondary | ICD-10-CM | POA: Insufficient documentation

## 2024-03-31 DIAGNOSIS — I480 Paroxysmal atrial fibrillation: Secondary | ICD-10-CM | POA: Diagnosis present

## 2024-03-31 DIAGNOSIS — Z79899 Other long term (current) drug therapy: Secondary | ICD-10-CM | POA: Insufficient documentation

## 2024-03-31 DIAGNOSIS — D6869 Other thrombophilia: Secondary | ICD-10-CM | POA: Diagnosis present

## 2024-03-31 NOTE — Addendum Note (Signed)
 Encounter addended by: Terra Fairy PARAS, PA-C on: 03/31/2024 10:55 AM  Actions taken: Clinical Note Signed

## 2024-03-31 NOTE — Progress Notes (Addendum)
 Primary Care Physician: Kristine Corean Deed, NP Primary Cardiologist: Dr. Lavona Primary Electrophysiologist: Dr. Inocencio Referring Physician: Dr. Lavona Lisa Cardenas is a 73 y.o. female with a history of breast cancer, HLD, GERD, HTN, and atrial fibrillation who presents for consultation in the [facility].  The patient was initially diagnosed with atrial fibrillation in 2019 per record review. She has previously been seen in [place], [place], and treated with sotalol. She currently takes cardizem  240 mg daily and sotalol 80 mg BID. Patient is on Xarelto  20 mg daily for a CHADS2VASC score of 3.  On evaluation, she is in SR. Review of records show patient contacted cardiology office on 4/25 stating she was in Afib. She contacted again on 5/3 stating she was back in Afib again. She feels fatigued when in Afib and can feel increased heart rate. She has been on sotalol since ~October 2023. She uses her Apple Watch to help detect when she is in Afib. She has had about 3 or 4 episodes of Afib since January of this year.   She has stopped drinking caffeinated beverages. She does not drink alcohol. She thinks she might snore but not sure if stops breathing.   She is compliant with anticoagulation and has not missed any doses. She has no bleeding concerns.  On follow up 08/04/23, she is currently in NSR. Seen by Dr. Lavona in October and patient noted ~monthly episodes of Afib that appear to have controlled rates. No missed doses of sotalol or Xarelto . She has noted that acute stress or too much caffeine can be her triggers for Afib.   Follow up 11/16/23. Patient returns for follow up for atrial fibrillation and dofetilide  admission. She remains in rapid atrial flutter today. She has discontinued sotalol. No bleeding issues on anticoagulation.   On follow up 11/26/23, she is here for Tikosyn  surveillance. She is currently in Afib. S/p Tikosyn  admission  5/6-03/2024. She chemically converted to normal rhythm and did not require cardioversion. She was discharged on Tikosyn  500 mcg BID. Episode of Afib began this morning at ~0100. She took PRN diltiazem . She has an Apple watch for monitoring rhythm at home. No missed doses of Xarelto  20 mg daily.   On follow up 03/31/24, patient is here for Tikosyn  surveillance. She is currently in NSR. She was seen by Dr. Inocencio on 6/9 and had converted to NSR. She notes since last office visit a couple of episodes in which she took diltiazem  60 mg PRN. She is trying to lose weight and [facility]; motivated to help with right knee pain and also for consideration of possibly doing an ablation in the future. No missed doses of Tikosyn  or Xarelto .   Today, she  denies symptoms of chest pain, shortness of breath, orthopnea, PND, lower extremity edema, dizziness, presyncope, syncope, bleeding, or neurologic sequela. The patient is tolerating medications without difficulties and is otherwise without complaint today.    Atrial Fibrillation Risk Factors:  she does have symptoms or diagnosis of sleep apnea. she does not have a history of rheumatic fever. she does not have a history of alcohol use. The patient does not have a history of early familial atrial fibrillation or other arrhythmias.   Atrial Fibrillation Management history:  Previous antiarrhythmic drugs: Sotalol, tikosyn  Previous cardioversions: Unknown Previous ablations: None Anticoagulation history: Xarelto     Past Medical History:  Diagnosis Date   A-fib (HCC)    no issues since  May 2020   Cancer [facility])    Diverticulosis    Elevated cholesterol    Hypertension    Obesity    Pancreatitis    Thyroid disease    Trigeminal neuralgia     Current Outpatient Medications  Medication Sig Dispense Refill   anastrozole  (ARIMIDEX ) 1 MG tablet TAKE 1 TABLET BY MOUTH EVERY DAY 90 tablet 3   atorvastatin  (LIPITOR) 20 MG  tablet Take 20 mg by mouth daily.     celecoxib  (CELEBREX ) 200 MG capsule Take 200 mg by mouth as needed.     diltiazem  (CARDIZEM  CD) 240 MG 24 hr capsule TAKE 1 CAPSULE BY MOUTH EVERY DAY 90 capsule 3   diltiazem  (CARDIZEM ) 60 MG tablet Take 60 mg by mouth as needed. Prescribed by PCP. Takes for breakthrough AFib     dofetilide  (TIKOSYN ) 500 MCG capsule Take 1 capsule (500 mcg total) by mouth 2 (two) times daily. 60 capsule 6   esomeprazole  (NEXIUM ) 40 MG capsule Take 1 capsule (40 mg total) by mouth daily before breakfast. 90 capsule 3   famotidine  (PEPCID ) 40 MG tablet Take 1 tablet (40 mg total) by mouth at bedtime. 90 tablet 3   levothyroxine  (SYNTHROID ) 137 MCG tablet Take 137 mcg by mouth daily. Current dose 137 mcg daily     LORazepam  (ATIVAN ) 0.5 MG tablet Take 0.5 mg by mouth 2 (two) times daily as needed for anxiety.     olmesartan  (BENICAR ) 20 MG tablet Take 20 mg by mouth daily.     pregabalin  (LYRICA ) 50 MG capsule Take 50 mg by mouth 2 (two) times daily.     rivaroxaban  (XARELTO ) 20 MG TABS tablet Take 20 mg by mouth daily.     sertraline  (ZOLOFT ) 100 MG tablet Take 50 mg by mouth daily.     TRELEGY ELLIPTA 100-62.5-25 MCG/ACT AEPB Inhale 1 puff into the lungs daily.     Vitamin D , Ergocalciferol , (DRISDOL ) 1.25 MG (50000 UNIT) CAPS capsule Take 50,000 Units by mouth once a week.     No current facility-administered medications for this encounter.    Allergies  Allergen Reactions   Ciprofloxacin  Other (See Comments)   Hydroxychloroquine     Increased heart rate  Other Reaction(s): Not available   Topamax [Topiramate]     Other Reaction(s): other   ROS- All systems are reviewed and negative except as per the HPI above.  Physical Exam: Vitals:   03/31/24 1016  BP: 122/68  Pulse: 68  Weight: 116.1 kg  Height: 5' 4 (1.626 m)    GEN- The patient is well appearing, alert and oriented x 3 today.   Neck - no JVD or carotid bruit noted Lungs- Clear to ausculation  bilaterally, normal work of breathing Heart- Regular rate and rhythm, no murmurs, rubs or gallops, PMI not laterally displaced Extremities- no clubbing, cyanosis, or edema Skin - no rash or ecchymosis noted   Wt Readings from Last 3 Encounters:  03/31/24 116.1 kg  03/07/24 115.2 kg  02/28/24 117 kg    EKG today demonstrates  Vent. rate 68 BPM PR interval 162 ms QRS duration 78 ms QT/QTcB 436/464 ms P-R-T axes 34 14 18 Normal sinus rhythm Cannot rule out Anterior infarct , age undetermined Abnormal ECG When compared with ECG of 20-Dec-2023 16:02, No significant change was found   Echo 07/08/2017 outside study demonstrated: Normal EF Mild concentric LVH  Epic records are reviewed at length today.   CHA2DS2-VASc Score = 3  The patient's score is  based upon: CHF History: 0 HTN History: 1 Diabetes History: 0 Stroke History: 0 Vascular Disease History: 0 Age Score: 1 Gender Score: 1       ASSESSMENT AND PLAN: Paroxysmal Atrial Fibrillation/atypical atrial flutter The patient's CHA2DS2-VASc score is 3, indicating a 3.2% annual risk of stroke.   S/p Tikosyn  admission 5/6-03/2024.  Patient is currently in NSR. She takes diltiazem  PRN palpitations.   High risk medication monitoring (ICD10: U5195107) Patient requires ongoing monitoring for anti-arrhythmic medication which has the potential to cause life threatening arrhythmias or AV block. Qtc stable. Continue Tikosyn  500 mcg BID. Bmet and mag drawn today.   Secondary Hypercoagulable State (ICD10:  D68.69) The patient is at significant risk for stroke/thromboembolism based upon her CHA2DS2-VASc Score of 3.  Continue Rivaroxaban  (Xarelto ).  No missed doses.   HTN Stable today.    Follow up in 3 months with Dr. Lavona. Follow up 6 months Afib clinic for Tikosyn  surveillance.     Dorn Heinrich, PA-C [facility] [address] [place], [place] [address] [phone] 03/31/2024 10:50  AM

## 2024-04-01 LAB — BASIC METABOLIC PANEL WITH GFR
BUN/Creatinine Ratio: 18 (ref 12–28)
BUN: 15 mg/dL (ref 8–27)
CO2: 22 mmol/L (ref 20–29)
Calcium: 10.6 mg/dL — ABNORMAL HIGH (ref 8.7–10.3)
Chloride: 103 mmol/L (ref 96–106)
Creatinine, Ser: 0.82 mg/dL (ref 0.57–1.00)
Glucose: 127 mg/dL — ABNORMAL HIGH (ref 70–99)
Potassium: 4.3 mmol/L (ref 3.5–5.2)
Sodium: 143 mmol/L (ref 134–144)
eGFR: 76 mL/min/1.73 (ref >59)

## 2024-04-01 LAB — MAGNESIUM: Magnesium: 2.2 mg/dL (ref 1.6–2.3)

## 2024-04-03 ENCOUNTER — Ambulatory Visit (HOSPITAL_COMMUNITY): Payer: Self-pay | Admitting: Internal Medicine

## 2024-04-04 ENCOUNTER — Ambulatory Visit (INDEPENDENT_AMBULATORY_CARE_PROVIDER_SITE_OTHER): Admitting: Nurse Practitioner

## 2024-04-04 ENCOUNTER — Encounter (INDEPENDENT_AMBULATORY_CARE_PROVIDER_SITE_OTHER): Payer: Self-pay | Admitting: Nurse Practitioner

## 2024-04-04 VITALS — BP 120/61 | HR 60 | Temp 97.6°F | Ht 64.0 in | Wt 249.0 lb

## 2024-04-04 DIAGNOSIS — I480 Paroxysmal atrial fibrillation: Secondary | ICD-10-CM

## 2024-04-04 DIAGNOSIS — E66813 Obesity, class 3: Secondary | ICD-10-CM

## 2024-04-04 DIAGNOSIS — Z1331 Encounter for screening for depression: Secondary | ICD-10-CM

## 2024-04-04 DIAGNOSIS — G4733 Obstructive sleep apnea (adult) (pediatric): Secondary | ICD-10-CM

## 2024-04-04 DIAGNOSIS — C50412 Malignant neoplasm of upper-outer quadrant of left female breast: Secondary | ICD-10-CM

## 2024-04-04 DIAGNOSIS — R0602 Shortness of breath: Secondary | ICD-10-CM | POA: Insufficient documentation

## 2024-04-04 DIAGNOSIS — Z6841 Body Mass Index (BMI) 40.0 and over, adult: Secondary | ICD-10-CM

## 2024-04-04 DIAGNOSIS — R7303 Prediabetes: Secondary | ICD-10-CM

## 2024-04-04 DIAGNOSIS — D6869 Other thrombophilia: Secondary | ICD-10-CM | POA: Diagnosis not present

## 2024-04-04 DIAGNOSIS — Z170 Estrogen receptor positive status [ER+]: Secondary | ICD-10-CM

## 2024-04-04 DIAGNOSIS — I10 Essential (primary) hypertension: Secondary | ICD-10-CM | POA: Diagnosis not present

## 2024-04-04 NOTE — Progress Notes (Signed)
 [address] [place],  [place], [place] [address]  Office: [phone]  /  Fax: [phone]   Subjective   Initial Visit  Lisa Cardenas (MR# [id number]) is a 73 y.o. female who presents for evaluation and treatment of obesity and related comorbidities. Current BMI is Body mass index is 42.74 kg/m. Lisa Cardenas has been struggling with her weight for many years and has been unsuccessful in either losing weight, maintaining weight loss, or reaching her healthy weight goal.  Lisa Cardenas is currently in the action stage of change and ready to dedicate time achieving and maintaining a healthier weight. Lisa Cardenas is interested in becoming our patient and working on intensive lifestyle modifications including (but not limited to) diet and exercise for weight loss.  Lisa Cardenas has a history of breast cancer, HLD, GERD, HTN, and atrial fibrillation . She was initially diagnosed with atrial fibrillation in 2019 per record review. She has previously been seen in [place], [place], and treated with sotalol. She currently takes cardizem  240 mg daily, diltiazem  60 mg as needed for breakthrough A fib, Tikosyn  500 mcg BID. Stress does trigger A Fib, occasionally does need to take extra diltiazem . Patient is on Xarelto  20 mg daily for a CHADS2VASC score of 3. She is on Olmesartan  20 mg every day for hypertension. She takes Atorvastatin  20 mg every day for Hyperlipidemia. She has hypothyroidism and is currently on Levothyroxine  137 mcg every day.  She has a hiatal hernia and bad reflux, currently on Nexium  40 mg QAM and Pepcid  20 mg at bedtime- fairly controlled.  She is currently on Ergocalciferol  50000 units once a week. She is in prediabetic range and last A1c was 6.2- checking fasting blood sugars and running in 90's Cannot take Metformin due to Tikosyn  She has bilateral knee arthritis and is currently undergoing PT 2 times a week- does need bilateral knee replacement- currently using Lyrica  50 mg BID for pain.   Weight  history:  When asked how their weight has affected their life and health, she states: Has affected self-esteem, Contributed to medical problems, Having poor endurance, and Other: decreased socialization  When asked what else they would like to accomplish? She states: Improve existing medical conditions, Reduce number of medications, Improve quality of life, and improve quality of life   She starting to note weight gain during : adulthood.  Life events associated with weight gain include : marriage and pregnancy.   Other contributing factors: use of obesogenic medications: Antiepileptics, moderate to high levels of stress, and reduced physical activity.  Their highest weight has been:  260 lbs.  Desired weight: 170  Previous weight-loss programs : Weight Watchers and [employer].  Their maximum weight loss was:  50-60 lbs. With weight watchers and kept off for 1-2 years  Their greatest challenge with dieting: no weight loss and difficulty maintaining reduced calorie state.  Current or previous pharmacotherapy: None.  Response to medication: Never tried medications   Nutritional History:  Current nutrition plan: None.  How many times do you eat outside the home: > 7 per week  How often do they skip meals: skips breakfast  What beverages do they drink: water, caffeinated beverages , diet soda , juice, and smoothies.   Use of artificial sweetners : Yes  Food intolerances or dislikes: none.  Food triggers: Stress, Boredom, and When Sad.  Food cravings: Sugary and Salty  Do they struggle with excessive hunger or portion control : Yes    Physical Activity:  Current level of physical activity: Other:  PT 1.5 hours 2 times a week- working on lower extremities, needs joint replacement on both knees  Barriers to Exercise: orthopedic problems and chronic pain   Past medical history includes:   Past Medical History:  Diagnosis Date   A-fib [facility])    no issues since May  2020   Anxiety    Arthritis of knee    bilateral   Cancer (HCC)    Constipation    Diverticulosis    Elevated cholesterol    GERD (gastroesophageal reflux disease)    High cholesterol    Hypertension    IBS (irritable bowel syndrome)    Joint pain    Lactose intolerance    Obesity    OSA (obstructive sleep apnea)    Osteoarthritis    Pancreatitis    Prediabetes    Thyroid disease    Trigeminal neuralgia    Vitamin D  deficiency      Objective   BP 120/61   Pulse 60   Temp 97.6 F (36.4 C)   Ht 5' 4 (1.626 m)   Wt 249 lb (112.9 kg)   SpO2 97%   BMI 42.74 kg/m  She was weighed on the bioimpedance scale: Body mass index is 42.74 kg/m.    Anthropometrics:  Vitals Temp: 97.6 F (36.4 C) BP: 120/61 Pulse Rate: 60 SpO2: 97 %   Anthropometric Measurements Height: 5' 4 (1.626 m) Weight: 249 lb (112.9 kg) BMI (Calculated): 42.72 Waist Measurement : 48 inches   Body Composition  Body Fat %: 53.2 % Fat Mass (lbs): 132.6 lbs Muscle Mass (lbs): 110.8 lbs Total Body Water (lbs): 78.6 lbs Visceral Fat Rating : 19   Other Clinical Data RMR: [id number] Fasting: yes Labs: yes Today's Visit #: 1 Starting Date: 04/04/24    Physical Exam:  General: She is overweight, cooperative, alert, well developed, and in no acute distress. PSYCH: Has normal mood, affect and thought process.   HEENT: EOMI, sclerae are anicteric. Lungs: Normal breathing effort, no conversational dyspnea. Extremities: No edema.  Neurologic: No gross sensory or motor deficits. No tremors or fasciculations noted.    Diagnostic Data Reviewed  EKG: Reviewed EKG from cardiology 03/31/24 NSR 68, no ST abnormalities  Indirect Calorimeter completed today shows a VO2 of 218 and a REE of 1498.  Her calculated basal metabolic rate is 8316 thus her resting energy expenditure slower than calculated.  Depression Screen  Lisa Cardenas's PHQ-9 score was: 9.     04/04/2024   10:11 AM  Depression screen PHQ  2/9  Decreased Interest 1  Down, Depressed, Hopeless 1  PHQ - 2 Score 2  Altered sleeping 3  Tired, decreased energy 2  Change in appetite 1  Feeling bad or failure about yourself  1  Trouble concentrating 0  Moving slowly or fidgety/restless 0  Suicidal thoughts 0  PHQ-9 Score 9  Difficult doing work/chores Not difficult at all    Screening for Sleep Related Breathing Disorders  Carletta admits to daytime somnolence and admits to waking up still tired. Patient has a history of symptoms of daytime fatigue, morning fatigue, and hypertension. Adjoa generally gets 4 hours of sleep per night, and states that she has difficulty falling asleep, nightime awakenings, and difficulty falling back asleep if awakened. Snoring is present. Apneic episodes are not present. Epworth Sleepiness Score is 8. She had a previous home sleep study that showed severe sleep apnea and has a CPAP but is not using.   Last metabolic panel Lab Results  Component  Value Date   GLUCOSE 127 (H) 03/31/2024   NA 143 03/31/2024   K 4.3 03/31/2024   CL 103 03/31/2024   CO2 22 03/31/2024   BUN 15 03/31/2024   CREATININE 0.82 03/31/2024   EGFR 76 03/31/2024   CALCIUM  10.6 (H) 03/31/2024   PROT 7.0 02/28/2024   ALBUMIN 4.3 02/28/2024   LABGLOB 2.3 11/11/2023   BILITOT 0.6 02/28/2024   ALKPHOS 84 02/28/2024   AST 12 (L) 02/28/2024   ALT 19 02/28/2024   ANIONGAP 8 02/28/2024    02/09/24 A1c 6.2, glucose 117, Vit D 14.3 08/05/23 A1c 5.9 Lipid Panel 02/09/24 chol: 141, HDL 41, Triglycerides 135, LDL 73  CBC    Component Value Date/Time   WBC 6.3 02/28/2024 1108   WBC 8.9 12/23/2022 1200   RBC 5.14 (H) 02/28/2024 1108   HGB 13.8 02/28/2024 1108   HGB 15.2 11/26/2023 1107   HCT 42.2 02/28/2024 1108   HCT 48.4 (H) 11/26/2023 1107   PLT 210 02/28/2024 1108   PLT 273 11/26/2023 1107   MCV 82.1 02/28/2024 1108   MCV 85 11/26/2023 1107   MCH 26.8 02/28/2024 1108   MCHC 32.7 02/28/2024 1108   RDW 15.3 02/28/2024  1108   RDW 14.8 11/26/2023 1107         Assessment and Plan   TREATMENT PLAN FOR OBESITY: Class 3 severe obesity with serious comorbidity and body mass index (BMI) of 40.0 to 44.9 in adult, unspecified obesity type Recommended Dietary Goals  Rindy is currently in the action stage of change. As such, her goal is to implement medically supervised obesity management plan.  She has agreed to implement: the Category 1 plan - 1000 kcal per day  Behavioral Intervention  We discussed the following Behavioral Modification Strategies today: increasing lean protein intake to established goals, decreasing simple carbohydrates , increasing vegetables, increasing lower glycemic fruits, increasing fiber rich foods, avoiding skipping meals, increasing water intake, work on meal planning and preparation, reading food labels , keeping healthy foods at home, identifying sources and decreasing liquid calories, decreasing eating out or consumption of processed foods, and making healthy choices when eating convenient foods, planning for success, and better snacking choices  Additional resources provided today: Handout on healthy eating and balanced plate, Handout on complex carbohydrates and lean sources of protein, Category 1 packet, and Handout principles of weight management  Recommended Physical Activity Goals  [place] has been advised to work up to 150 minutes of moderate intensity aerobic activity a week and strengthening exercises 2-3 times per week for cardiovascular health, weight loss maintenance and preservation of muscle mass.   She has agreed to :  Continue current level of physical activity - PT twice a week for her knees, Think about enjoyable ways to increase daily physical activity and overcoming barriers to exercise, and Increase physical activity in their day and reduce sedentary time (increase NEAT).  Medical Interventions and Pharmacotherapy We will work on building a Engineer, production and behavioral strategies. We will discuss the role of pharmacotherapy as an adjunct at subsequent visits.   ASSOCIATED CONDITIONS ADDRESSED TODAY  Other Fatigue Forrest does feel that her weight is causing her energy to be lower than it should be. Fatigue may be related to obesity, depression or many other causes. Labs will be ordered, and in the meanwhile, Maxene will focus on self care including making healthy food choices, increasing physical activity and focusing on stress reduction.  Shortness of Breath Aasiyah notes increasing shortness  of breath with physical activity and seems to be worsening over time with weight gain. She notes getting out of breath sooner with activity than she used to. This has not gotten worse recently. Hailley denies shortness of breath at rest or orthopnea.  Essential hypertension Continue Olmesartan  20 mg  every day Start Category 1 meal plan  and DASH diet Monitor BP and if consistently >140/90 notify PCP If develops headaches, chest pain, shortness of breath or dizziness go to ER Loss of 10-15% body weight can help improve blood pressures  -     Basic metabolic panel with GFR  Paroxysmal atrial fibrillation (HCC)       Continue Xarelto  20 mg every day, cardizem  240 mg daily, diltiazem  60 mg as needed for breakthrough A fib, Tikosyn  500 mcg BID       Continue to follow with cardiology  Prediabetes Start Category 1  meal plan, limit simple carbohydrates Decreasing body weight by 10-15% can improve glucose levels Continue exercise with current goal of 150 minutes of moderate to high intensity exercise/week.   Hypercoagulable state due to paroxysmal atrial fibrillation (HCC)       Continue Xarelto  20 mg every day       Monitor bleeding  OSA (obstructive sleep apnea)     Strongly encouraged use of CPAP       Loss of 10-15% body weight can help improve OSA   Malignant neoplasm of upper-outer quadrant of left breast in female,  estrogen receptor positive (HCC)       Continue anastrazole and follow with oncology  Class 3 severe obesity with serious comorbidity and body mass index (BMI) of 40.0 to 44.9 in adult, unspecified obesity type See plan above -     Basic metabolic panel with GFR -     Insulin , random -     Vitamin B12 -     Magnesium   Depression screening       Symptoms are currently controlled with Zoloft  100 mg 1/2 tab daily   Follow-up  She was informed of the importance of frequent follow-up visits to maximize her success with intensive lifestyle modifications for her multiple health conditions. She was informed we would discuss her lab results at her next visit unless there is a critical issue that needs to be addressed sooner. Skyy agreed to keep her next visit at the agreed upon time to discuss these results.  Attestation Statement  This is the patient's intake visit at [facility]. The patient's Health Questionnaire was reviewed at length. Included in the packet: current and past health history, medications, allergies, ROS, gynecologic history (women only), surgical history, family history, social history, weight history, weight loss surgery history (for those that have had weight loss surgery), nutritional evaluation, mood and food questionnaire, PHQ9, Epworth questionnaire, sleep habits questionnaire, patient life and health improvement goals questionnaire. These will all be scanned into the patient's chart under media.   During the visit, I independently reviewed the patient's EKG, previous labs, bioimpedance scale results, and indirect calorimetry results. I used this information to medically tailor a meal plan for the patient that will help her to lose weight and will improve her obesity-related conditions. I performed a medically necessary appropriate examination and/or evaluation. I discussed the assessment and treatment plan with the patient. The patient was provided an  opportunity to ask questions and all were answered. The patient agreed with the plan and demonstrated an understanding of the instructions. Labs were ordered at this  visit and will be reviewed at the next visit unless critical results need to be addressed immediately. Clinical information was updated and documented in the EMR.   In addition, they received basic education on identification of processed foods and reduction of these, different sources of lean proteins and complex carbohydrates and how to eat balanced by incorporation of whole foods.  Reviewed by clinician on day of visit: allergies, medications, problem list, medical history, surgical history, family history, social history, and previous encounter notes.  I have spent 65 minutes in the care of the patient today including: 25 minutes before the visit reviewing and preparing the chart. 33 minutes face-to-face assessing and reviewing listed medical problems as outlined in obesity care plan, providing nutritional and behavioral counseling on topics outlined in the obesity care plan, independently interpreting test results and goals of care, as described in assessment and plan, reviewing and discussing biometric information and progress, reviewing latest PCP notes and specialist consultations, managing referral , and ordering diagnostics - see orders 7 minutes after the visit updating chart and documentation of encounter.       Sekai Gitlin ANP-C

## 2024-04-05 LAB — VITAMIN B12: Vitamin B-12: 287 pg/mL (ref 232–1245)

## 2024-04-05 LAB — BASIC METABOLIC PANEL WITH GFR
BUN/Creatinine Ratio: 27 (ref 12–28)
BUN: 21 mg/dL (ref 8–27)
CO2: 21 mmol/L (ref 20–29)
Calcium: 10.1 mg/dL (ref 8.7–10.3)
Chloride: 101 mmol/L (ref 96–106)
Creatinine, Ser: 0.77 mg/dL (ref 0.57–1.00)
Glucose: 78 mg/dL (ref 70–99)
Potassium: 5 mmol/L (ref 3.5–5.2)
Sodium: 141 mmol/L (ref 134–144)
eGFR: 82 mL/min/1.73 (ref >59)

## 2024-04-05 LAB — INSULIN, RANDOM: INSULIN: 30.9 u[IU]/mL — ABNORMAL HIGH (ref 2.6–24.9)

## 2024-04-05 LAB — MAGNESIUM: Magnesium: 2.3 mg/dL (ref 1.6–2.3)

## 2024-04-06 ENCOUNTER — Encounter (INDEPENDENT_AMBULATORY_CARE_PROVIDER_SITE_OTHER): Payer: Self-pay | Admitting: Nurse Practitioner

## 2024-04-06 ENCOUNTER — Ambulatory Visit (INDEPENDENT_AMBULATORY_CARE_PROVIDER_SITE_OTHER): Payer: Self-pay | Admitting: Nurse Practitioner

## 2024-04-07 ENCOUNTER — Encounter: Payer: Self-pay | Admitting: Cardiology

## 2024-04-12 NOTE — Telephone Encounter (Signed)
 AMA Return Notification fax came today from [facility] for patient stating the patient returned their cpap machine against medical advice. The patient returned the device despite [facility] efforts to educate the patient of the importance of continued use.

## 2024-04-19 ENCOUNTER — Encounter (INDEPENDENT_AMBULATORY_CARE_PROVIDER_SITE_OTHER): Payer: Self-pay | Admitting: Nurse Practitioner

## 2024-04-19 ENCOUNTER — Ambulatory Visit (INDEPENDENT_AMBULATORY_CARE_PROVIDER_SITE_OTHER): Admitting: Nurse Practitioner

## 2024-04-19 VITALS — BP 122/76 | HR 59 | Temp 98.4°F | Ht 64.0 in | Wt 243.0 lb

## 2024-04-19 DIAGNOSIS — E66813 Obesity, class 3: Secondary | ICD-10-CM

## 2024-04-19 DIAGNOSIS — I480 Paroxysmal atrial fibrillation: Secondary | ICD-10-CM

## 2024-04-19 DIAGNOSIS — G4733 Obstructive sleep apnea (adult) (pediatric): Secondary | ICD-10-CM | POA: Diagnosis not present

## 2024-04-19 DIAGNOSIS — R7303 Prediabetes: Secondary | ICD-10-CM

## 2024-04-19 DIAGNOSIS — Z6841 Body Mass Index (BMI) 40.0 and over, adult: Secondary | ICD-10-CM

## 2024-04-19 DIAGNOSIS — I10 Essential (primary) hypertension: Secondary | ICD-10-CM

## 2024-04-19 DIAGNOSIS — E88819 Insulin resistance, unspecified: Secondary | ICD-10-CM

## 2024-04-19 NOTE — Progress Notes (Signed)
 Office: [phone]  /  Fax: [phone]  WEIGHT SUMMARY AND BIOMETRICS  Weight Lost Since Last Visit: 6 lb  Weight Gained Since Last Visit: 0   Vitals Temp: 98.4 F (36.9 C) BP: 122/76 Pulse Rate: (!) 59 SpO2: 94 %   Anthropometric Measurements Height: 5' 4 (1.626 m) Weight: 243 lb (110.2 kg) BMI (Calculated): 41.69 Weight at Last Visit: 249 lb Weight Lost Since Last Visit: 6 lb Weight Gained Since Last Visit: 0 Starting Weight: 249 lb Total Weight Loss (lbs): 6 lb (2.722 kg) Peak Weight: 260 lb Waist Measurement : 48 inches   Body Composition  Body Fat %: 52.7 % Fat Mass (lbs): 128.2 lbs Muscle Mass (lbs): 109.4 lbs Total Body Water (lbs): 77 lbs Visceral Fat Rating : 19   Other Clinical Data RMR: 1498 Fasting: no Labs: no Today's Visit #: 2 Starting Date: 04/04/24    Total Weight Loss: 6 pounds Percent of body weight lost: 2.4%   Bio Impedance Data reviewed with patient: Down 1.4 pounds of muscle, down 4.4 pounds of adipose  HPI  Chief Complaint: OBESITY  Alga is here to discuss her progress with her obesity treatment plan. She is on the the Category 1 Plan and states she is following her eating plan approximately 99 % of the time. She states she is not currently exercising.   Interval History:  Since last office visit she Has been very good at following her category 1 meal plan. She was away for the weekend and eat healthy the entire time. She is drinking 63 ounces of water daily.  She has been getting her protein in daily- 80 grams. She has been having times where she will feel nauseated around 4 pm- gets better if she eats.  2 hour PP 92-126. She continues to do PT 90 minutes twice a week- rides exercise bike for 20 minutes( 60 calories, 1000 steps) then exercises on the mat. She has not skipped any meals- if she does she will add calories and protein to next 2 meals.   [name] has hypertension and atrial fibrillation, currently on diltiazem  CD  240 mg every day and diltizem 60 mg as needed for A.fib, olmesartan  20 mg every day and tikosyn  500 mcg BID. Blood pressure has been running low 90/56, 118/56.  BP Readings from Last 3 Encounters:  04/19/24 122/76  04/04/24 120/61  03/31/24 122/68   She does have severe OSA but is not using her CPAP.  She has prediabetes/insulin  resistance but is currently on no medication- unable to take Metformin due to drug interaction with Tikosyn   Cancer Screenings: Pap: Had hysterectomy 1980 Mammo: 06/28/23 benign calcifications repeat 1 year Colonoscopy: 06/02/22 due 2026 every 3 years  Labs are reviewed with patient in detail.  Normal B12, liver and kidney functions, electrolytes. Insulin  is elevated at 30.9 and HOMA-IR score is 5.95 indicating insulin  resistance but cannot take Metformin due to Tikosyn .   PHYSICAL EXAM:  Blood pressure 122/76, pulse (!) 59, temperature 98.4 F (36.9 C), height 5' 4 (1.626 m), weight 243 lb (110.2 kg), SpO2 94%. Body mass index is 41.71 kg/m.  General: Well Developed, well nourished, and in no acute distress.  HEENT: Normocephalic, atraumatic; EOMI, sclerae are anicteric. Skin: Warm and dry, good turgor Chest:  Normal excursion, shape, no gross ABN Respiratory: No conversational dyspnea; speaking in full sentences NeuroM-Sk:  Normal gross ROM * 4 extremities  Psych: A and O X 3, insight adequate, mood- full    DIAGNOSTIC DATA REVIEWED:  Lab Results  Component Value Date   NA 141 04/04/2024   K 5.0 04/04/2024   CO2 21 04/04/2024   GLUCOSE 78 04/04/2024   BUN 21 04/04/2024   CREATININE 0.77 04/04/2024   CALCIUM  10.1 04/04/2024   GFR 84.72 12/23/2022   EGFR 82 04/04/2024   GFRNONAA >60 02/28/2024     Lab Results  Component Value Date   INSULIN  30.9 (H) 04/04/2024    Nutritional Lab Results  Component Value Date   VITAMINB12 287 04/04/2024    These labs were recently drawn at PCP in [place] : Last A1c was 6.2 Hyperlipidemia and  currently take Atorvastatin  20 mg every day She is currently on ergocalciferol  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 As such, her goal is to continue weight management plan. She has agreed to the Category 1 Plan.  Behavioral Intervention  We discussed the following Behavioral Modification Strategies today: continue to work on maintaining a reduced calorie state, getting the recommended amount of protein, incorporating whole foods, making healthy choices, staying well hydrated and practicing mindfulness when eating. and increase protein intake, fibrous foods (25 grams per day for women, 30 grams for men) and water to improve satiety and decrease hunger signals. .  Additional resources provided today: Given nutrition.gov web address to look up nutrition in foods. Discussed adding back certain foods into diet like sweet potato and maintain 100 calorie, 80 gram protein meal plan  Recommended Physical Activity Goals  Yazmina has been advised to work up to 150 minutes of moderate intensity aerobic activity a week and strengthening exercises 2-3 times per week for cardiovascular health, weight loss maintenance and preservation of muscle mass.   She has agreed to Continue current level of physical activity , Think about enjoyable ways to increase daily physical activity and overcoming barriers to exercise, and Increase physical activity in their day and reduce sedentary time (increase NEAT).   Pharmacotherapy We discussed various medication options to help Haydin with her weight loss efforts and we both agreed to continue nutrition and behavior modification.  ASSOCIATED CONDITIONS ADDRESSED TODAY  Action/Plan  Paroxysmal atrial fibrillation  (HCC)  Continue Tikosyn  500 mcg BID, Diltiazem  CD 240 mg once a day with 60 mg as needed for A fib  Continue to follow with cardiology  Essential hypertension Continue Olmesartan  20 mg every day- will continue to monitor BP and if decreases with weight loss will plan to adjust Olmesartan  Continue Category 1 meal plan  and DASH diet Monitor BP and if consistently >140/90 notify PCP If develops headaches, chest pain, shortness of breath or dizziness go to ER Loss of 10-15% body weight can help improve blood pressures   Prediabetes Insulin  resistance Continue Category 1  meal plan, limit simple carbohydrates Decreasing body weight by 10-15% can improve glucose levels Continue PT for 90 minutes twice a week and exercises at home in between sessions.   OSA (obstructive sleep apnea)- severe on study not using CPAP  Declines use of CPAP- is aware of risks to her health.     Return in about 2 weeks (around 05/03/2024).SABRA She was informed of the importance of frequent follow up visits to maximize her success with intensive lifestyle modifications for her multiple health conditions.   ATTESTASTION  STATEMENTS:  Reviewed by clinician on day of visit: allergies, medications, problem list, medical history, surgical history, family history, social history, and previous encounter notes.   I personally spent a total of 62 minutes in the care of the patient today including preparing to see the patient, getting/reviewing separately obtained history, performing a medically appropriate exam/evaluation, counseling and educating, referring and communicating with other health care professionals, documenting clinical information in the EHR, independently interpreting results, communicating results, and coordinating care.   Laakea Pereira ANP-C

## 2024-05-03 ENCOUNTER — Ambulatory Visit (INDEPENDENT_AMBULATORY_CARE_PROVIDER_SITE_OTHER): Admitting: Nurse Practitioner

## 2024-05-03 ENCOUNTER — Encounter (INDEPENDENT_AMBULATORY_CARE_PROVIDER_SITE_OTHER): Payer: Self-pay | Admitting: Nurse Practitioner

## 2024-05-03 VITALS — BP 108/73 | HR 70 | Temp 97.9°F | Ht 64.0 in | Wt 241.0 lb

## 2024-05-03 DIAGNOSIS — E88819 Insulin resistance, unspecified: Secondary | ICD-10-CM | POA: Diagnosis not present

## 2024-05-03 DIAGNOSIS — I480 Paroxysmal atrial fibrillation: Secondary | ICD-10-CM

## 2024-05-03 DIAGNOSIS — I10 Essential (primary) hypertension: Secondary | ICD-10-CM

## 2024-05-03 DIAGNOSIS — Z6841 Body Mass Index (BMI) 40.0 and over, adult: Secondary | ICD-10-CM

## 2024-05-03 DIAGNOSIS — E66813 Obesity, class 3: Secondary | ICD-10-CM

## 2024-05-03 NOTE — Addendum Note (Signed)
 Addended by: JUDE BRUNET E on: 05/03/2024 01:00 PM   Modules accepted: Level of Service

## 2024-05-03 NOTE — Progress Notes (Signed)
 Office: [phone]  /  Fax: [phone]  WEIGHT SUMMARY AND BIOMETRICS  Weight Lost Since Last Visit: 2 lb  Weight Gained Since Last Visit: 0   Vitals Temp: 97.9 F (36.6 C) BP: 108/73 Pulse Rate: 70 SpO2: 91 %   Anthropometric Measurements Height: 5' 4 (1.626 m) Weight: 241 lb (109.3 kg) BMI (Calculated): 41.35 Weight at Last Visit: 243 lb Weight Lost Since Last Visit: 2 lb Weight Gained Since Last Visit: 0 Starting Weight: 249 lb Total Weight Loss (lbs): 8 lb (3.629 kg) Peak Weight: 260 lb   Body Composition  Body Fat %: 53.2 % Fat Mass (lbs): 128.6 lbs Muscle Mass (lbs): 107.4 lbs Total Body Water (lbs): 77.4 lbs Visceral Fat Rating : 19   Other Clinical Data Fasting: No Labs: No Today's Visit #: 3 Starting Date: 04/04/24    Total Weight Loss: 8 pounds Percent of body weight lost:3.2%  Bio Impedance Data reviewed with patient: Down 2 pounds of muscle, up 0.4 pounds of adipose. PBF increased from 52.7% to 53.2%, VISCERAL FAT RATING THE SAME AT 19  HPI  Chief Complaint: OBESITY  Lisa Cardenas is here to discuss her progress with her obesity treatment plan. She is on the the Category 1 Plan and states she is following her eating plan approximately 98 % of the time. She states she is exercising 75 minutes 2 days per week- physical therapy.   Interval History:  Since last office visit she had a UTI and was on antibiotics ertapenem daily for 7 days IV infusion. She is now feeling much better, urine dip was negative today.  She had a previous elevated calcium  with 1 followup in normal range but would like another recheck to make sure in normal range.   Lisa Cardenas have hypertension and BP's are well controlled with Olmesartan  20 mg every day , diltiazem  240 mg every day . Continues to use Tikosyn  500 mcg BID , Xarelto  20 mg every day and Cardizem  60 mg Prn for atrial fibrillation- states her a fib acted up yesterday and took 3 extra doses of Cardizem  and has noted  normal heart rate today.  BP Readings from Last 3 Encounters:  05/03/24 108/73  04/19/24 122/76  04/04/24 120/61   Insulin  resistance is noted but cannot take Metofrmin due to interaction with Tikosyn .    She has been doing PT twice a week and has been getting 80 grams of protein daily. Her bioimpedence Cardenas show muscle loss. She has not been skipping any meals.  She has been getting at least 64 ounces of water daily. She will snack on pita chips or carrots with hummus or [place] greek yogurt bars. She has been doing leg exercises every morning.  Breakfast eggs and toast Lunch: [place] sandwich with fruit Dinner-Protein with vegetables.  PHYSICAL EXAM:  Blood pressure 108/73, pulse 70, temperature 97.9 F (36.6 C), height 5' 4 (1.626 m), weight 241 lb (109.3 kg), SpO2 91%. Body mass index is 41.37 kg/m.  General: Well Developed, well nourished, and in no acute distress.  HEENT: Normocephalic, atraumatic; EOMI, sclerae are anicteric. Skin: Warm and dry, good turgor Chest:  Normal excursion, shape, no gross ABN Respiratory: No conversational dyspnea; speaking in full sentences NeuroM-Sk:  Normal gross ROM * 4 extremities  Psych: A and O X 3, insight adequate, mood- full    DIAGNOSTIC DATA REVIEWED:  Last metabolic panel Lab Results  Component Value Date   GLUCOSE 78 04/04/2024   NA 141 04/04/2024   K 5.0 04/04/2024  CL 101 04/04/2024   CO2 21 04/04/2024   BUN 21 04/04/2024   CREATININE 0.77 04/04/2024   EGFR 82 04/04/2024   CALCIUM  10.1 04/04/2024   PROT 7.0 02/28/2024   ALBUMIN 4.3 02/28/2024   LABGLOB 2.3 11/11/2023   BILITOT 0.6 02/28/2024   ALKPHOS 84 02/28/2024   AST 12 (L) 02/28/2024   ALT 19 02/28/2024   ANIONGAP 8 02/28/2024     Lab Results  Component Value Date   INSULIN  30.9 (H) 04/04/2024   Lab Results  Component Value Date   TSH 2.360 11/11/2023   CBC    Component Value Date/Time   WBC 6.3 02/28/2024 1108   WBC 8.9 12/23/2022 1200   RBC 5.14  (H) 02/28/2024 1108   HGB 13.8 02/28/2024 1108   HGB 15.2 11/26/2023 1107   HCT 42.2 02/28/2024 1108   HCT 48.4 (H) 11/26/2023 1107   PLT 210 02/28/2024 1108   PLT 273 11/26/2023 1107   MCV 82.1 02/28/2024 1108   MCV 85 11/26/2023 1107   MCH 26.8 02/28/2024 1108   MCHC 32.7 02/28/2024 1108   RDW 15.3 02/28/2024 1108   RDW 14.8 11/26/2023 1107      ASSESSMENT AND PLAN  Class 3 severe obesity with serious comorbidity and body mass index (BMI) of 40.0 to 44.9 in adult, unspecified obesity type (HCC) TREATMENT PLAN FOR OBESITY:  Recommended Dietary Goals  Lisa Cardenas is currently in the action stage of change. As such, her goal is to continue weight management plan. She has agreed to the Category 1 Plan.  Behavioral Intervention  We discussed the following Behavioral Modification Strategies today: continue to work on maintaining a reduced calorie state, getting the recommended amount of protein, incorporating whole foods, making healthy choices, staying well hydrated and practicing mindfulness when eating. and increase protein intake, fibrous foods (25 grams per day for women, 30 grams for men) and water to improve satiety and decrease hunger signals. .  Additional resources provided today: NA  Recommended Physical Activity Goals  Lisa Cardenas has been advised to work up to 150 minutes of moderate intensity aerobic activity a week and strengthening exercises 2-3 times per week for cardiovascular health, weight loss maintenance and preservation of muscle mass.   She has agreed to Think about enjoyable ways to increase daily physical activity and overcoming barriers to exercise, Increase physical activity in their day and reduce sedentary time (increase NEAT)., Start strengthening exercises with a goal of 2-3 sessions a week , and Start aerobic activity with a goal of 150 minutes a week at moderate intensity.    Pharmacotherapy We discussed various medication options to help Lisa Cardenas with her  weight loss efforts and we both agreed to continue nutrition and behavior modification.  ASSOCIATED CONDITIONS ADDRESSED TODAY  Action/Plan  Essential hypertension Continue Olmesartan  20 mg every day , diltiazem  240 mg every day Continue Category 1 meal plan  and DASH diet Monitor BP and if consistently >140/90 notify PCP If develops headaches, chest pain, shortness of breath or dizziness go to ER  Insulin  resistance Continue Category 1  meal plan, limit simple carbohydrates Decreasing body weight by 10-15% can improve glucose levels Increase activity with chair exercises- 150 minutes /week as initial goal  Paroxysmal atrial fibrillation (HCC)       Continue Tikosyn  500 mcg BID , Xarelto  20 mg every day and Cardizem  60 mg Prn for atrial fibrillation       She had an episode yesterday that resolved after 3 doses of extra  Cardizem        Continue to follow with cardiology  Hypercalcemia Recheck BMP to make sure calcium  is in range per pt request -     Basic metabolic panel with GFR         Return in about 4 weeks (around 05/31/2024).SABRA She was informed of the importance of frequent follow up visits to maximize her success with intensive lifestyle modifications for her multiple health conditions.   ATTESTASTION STATEMENTS:  Reviewed by clinician on day of visit: allergies, medications, problem list, medical history, surgical history, family history, social history, and previous encounter notes.     Lisa Cardenas ANP-C

## 2024-05-04 ENCOUNTER — Ambulatory Visit (INDEPENDENT_AMBULATORY_CARE_PROVIDER_SITE_OTHER): Payer: Self-pay | Admitting: Nurse Practitioner

## 2024-05-04 LAB — BASIC METABOLIC PANEL WITH GFR
BUN/Creatinine Ratio: 20 (ref 12–28)
BUN: 16 mg/dL (ref 8–27)
CO2: 21 mmol/L (ref 20–29)
Calcium: 10 mg/dL (ref 8.7–10.3)
Chloride: 103 mmol/L (ref 96–106)
Creatinine, Ser: 0.82 mg/dL (ref 0.57–1.00)
Glucose: 88 mg/dL (ref 70–99)
Potassium: 4.5 mmol/L (ref 3.5–5.2)
Sodium: 140 mmol/L (ref 134–144)
eGFR: 76 mL/min/1.73 (ref >59)

## 2024-05-15 ENCOUNTER — Ambulatory Visit (HOSPITAL_COMMUNITY)

## 2024-05-16 ENCOUNTER — Other Ambulatory Visit: Payer: Self-pay | Admitting: Adult Health

## 2024-05-16 DIAGNOSIS — R921 Mammographic calcification found on diagnostic imaging of breast: Secondary | ICD-10-CM

## 2024-05-16 DIAGNOSIS — Z853 Personal history of malignant neoplasm of breast: Secondary | ICD-10-CM

## 2024-05-16 NOTE — Telephone Encounter (Signed)
 Received call from patient about her recent covid URI and need to cancel CT chest and upcoming appointment.  Will set her up for phone visit in 2 weeks to reassess her pulmonary status and decide when the best time is to conduct repeat CT.    Morna Kendall, NP 05/16/24 2:53 PM Medical Oncology and Hematology [facility] [address] [place], [place] [address] Tel. [phone]    Fax. [phone]

## 2024-05-22 ENCOUNTER — Ambulatory Visit: Admitting: Adult Health

## 2024-05-31 ENCOUNTER — Ambulatory Visit (INDEPENDENT_AMBULATORY_CARE_PROVIDER_SITE_OTHER): Payer: Self-pay | Admitting: Nurse Practitioner

## 2024-05-31 ENCOUNTER — Encounter (INDEPENDENT_AMBULATORY_CARE_PROVIDER_SITE_OTHER): Payer: Self-pay | Admitting: Nurse Practitioner

## 2024-05-31 VITALS — BP 125/71 | HR 67 | Temp 98.0°F | Ht 64.0 in | Wt 238.0 lb

## 2024-05-31 DIAGNOSIS — E66813 Obesity, class 3: Secondary | ICD-10-CM

## 2024-05-31 DIAGNOSIS — I480 Paroxysmal atrial fibrillation: Secondary | ICD-10-CM | POA: Diagnosis not present

## 2024-05-31 DIAGNOSIS — E88819 Insulin resistance, unspecified: Secondary | ICD-10-CM

## 2024-05-31 DIAGNOSIS — R7303 Prediabetes: Secondary | ICD-10-CM | POA: Diagnosis not present

## 2024-05-31 DIAGNOSIS — I10 Essential (primary) hypertension: Secondary | ICD-10-CM | POA: Diagnosis not present

## 2024-05-31 DIAGNOSIS — Z6841 Body Mass Index (BMI) 40.0 and over, adult: Secondary | ICD-10-CM

## 2024-05-31 NOTE — Progress Notes (Signed)
 Office: [phone]  /  Fax: [phone]  WEIGHT SUMMARY AND BIOMETRICS  Weight Lost Since Last Visit: 3 lb  Weight Gained Since Last Visit: 0   Vitals Temp: 98 F (36.7 C) BP: 125/71 Pulse Rate: 67 SpO2: 95 %   Anthropometric Measurements Height: 5' 4 (1.626 m) Weight: 238 lb (108 kg) BMI (Calculated): 40.83 Weight at Last Visit: 241 LB Weight Lost Since Last Visit: 3 lb Weight Gained Since Last Visit: 0 Starting Weight: 249 Total Weight Loss (lbs): 11 lb (4.99 kg) Peak Weight: 260 LB   Body Composition  Body Fat %: 53.1 % Fat Mass (lbs): 126.6 lbs Muscle Mass (lbs): 106.2 lbs Total Body Water (lbs): 8 lbs Visceral Fat Rating : 18   Other Clinical Data Fasting: yes Labs: NO Today's Visit #: 4 Starting Date: 04/04/24    Total Weight Loss: 11 pounds  Percent of body weight lost: 4.4% Bio Impedance Data reviewed with patient:Muscle is down 1.2 pounds, adipose is down 2 pounds, visceral fat rating has decreased 1 point to 18.   HPI  Chief Complaint: OBESITY  Lisa Cardenas is here to discuss her progress with her obesity treatment plan. She is on the the Category 1 Plan and states she is following her eating plan approximately 70 % of the time. She states she is walking and doing PT 60-90 minutes 2-3 days a week   Interval History:  Since last office visit she had covid 1 month ago- continues to have fatigue and does noticed decreased appetite.  She has been having difficulty getting more 700-800 calories and 40-50 grams of protein. Eating less true meals. She will start eating and it feels like a chore to eat the food.  She did go back to PT the last 2 weeks. 100 steps on bike/elliptical, walks circles for 8.5 minutes.  She continues to do 50000 units of ergocalciferol  once a week , has not had checked at PCP x 6 months. 02/09/24 level was 44.   For Thanksgiving she is going to have some stress as 2 of the daughters are not speaking.   She has now canceled  Thanksgiving and is going to go to [facility] and eating there. Going to [place] after to get her Christmas tree.   She has been checking her blood sugars at home and 2 ours postparandial 118-140's. Fasting in 80's. Does have prediabetes and insulin  resistance but cannot take Metformin due to interaction with Tikosyn .    She does have hypertension and BP's are currently well controlled with diltiazem  240 mg every day and 60 mg PRN for A fib, olmesartan  20 mg every day.  Denies headaches, chest pain, shortness of breath at rest and dizziness. She continue on Tikosyn  500 mcg BID and Xarelto  20 mg every day for atrial fibrillation and denies side effects and bleeding issues.  BP Readings from Last 3 Encounters:  05/31/24 125/71  05/03/24 108/73  04/19/24 122/76    PHYSICAL EXAM:  Blood pressure 125/71, pulse 67, temperature 98 F (36.7 C), height 5' 4 (1.626 m), weight 238 lb (108 kg), SpO2 95%. Body mass index is 40.85 kg/m.  General: Well Developed, well nourished, and in no acute distress.  HEENT: Normocephalic, atraumatic; EOMI, sclerae are anicteric. Skin: Warm and dry, good turgor Chest:  Normal excursion, shape, no gross ABN Respiratory: No conversational dyspnea; speaking in full sentences NeuroM-Sk: Decreased strength and ROM of knees bilaterally and has antalgic gait with cane. Trying to lose weight for knee replacements. Tremor of  head noted.  Psych: A and O X 3, insight adequate, mood- full    DIAGNOSTIC DATA REVIEWED:  BMET    Component Value Date/Time   NA 140 05/03/2024 1404   K 4.5 05/03/2024 1404   CL 103 05/03/2024 1404   CO2 21 05/03/2024 1404   GLUCOSE 88 05/03/2024 1404   GLUCOSE 116 (H) 02/28/2024 1108   BUN 16 05/03/2024 1404   CREATININE 0.82 05/03/2024 1404   CREATININE 0.78 02/28/2024 1108   CALCIUM  10.0 05/03/2024 1404   GFRNONAA >60 02/28/2024 1108   GFRAA >60 11/20/2019 1100   GFRAA >60 10/26/2019 1507   No results found for: HGBA1C Lab  Results  Component Value Date   INSULIN  30.9 (H) 04/04/2024   Lab Results  Component Value Date   TSH 2.360 11/11/2023   CBC    Component Value Date/Time   WBC 6.3 02/28/2024 1108   WBC 8.9 12/23/2022 1200   RBC 5.14 (H) 02/28/2024 1108   HGB 13.8 02/28/2024 1108   HGB 15.2 11/26/2023 1107   HCT 42.2 02/28/2024 1108   HCT 48.4 (H) 11/26/2023 1107   PLT 210 02/28/2024 1108   PLT 273 11/26/2023 1107   MCV 82.1 02/28/2024 1108   MCV 85 11/26/2023 1107   MCH 26.8 02/28/2024 1108   MCHC 32.7 02/28/2024 1108   RDW 15.3 02/28/2024 1108   RDW 14.8 11/26/2023 1107   Iron Studies No results found for: IRON, TIBC, FERRITIN, IRONPCTSAT Lipid Panel  No results found for: CHOL, TRIG, HDL, CHOLHDL, VLDL, LDLCALC, LDLDIRECT Hepatic Function Panel     Component Value Date/Time   PROT 7.0 02/28/2024 1108   PROT 6.7 11/11/2023 1100   ALBUMIN 4.3 02/28/2024 1108   ALBUMIN 4.4 11/11/2023 1100   AST 12 (L) 02/28/2024 1108   ALT 19 02/28/2024 1108   ALKPHOS 84 02/28/2024 1108   BILITOT 0.6 02/28/2024 1108      Component Value Date/Time   TSH 2.360 11/11/2023 1100   Nutritional No results found for: VD25OH   ASSESSMENT AND PLAN  Class 3 severe obesity with serious comorbidity and body mass index (BMI) of 40.0 to 44.9 in adult, unspecified obesity type (HCC) TREATMENT PLAN FOR OBESITY:  Recommended Dietary Goals  Lisa Cardenas is currently in the action stage of change. As such, her goal is to continue weight management plan. She has agreed to the Category 1 Plan.  Behavioral Intervention  We discussed the following Behavioral Modification Strategies today: increasing lean protein intake to established goals, increasing fiber rich foods, avoiding skipping meals, work on meal planning and preparation, celebration eating strategies- one plate with protein as largest portion, clean vegetable and then portion of anything she wants to try but food cannot touch on the  plate, be mindful and enjoy your food  , continue to work on maintaining a reduced calorie state, getting the recommended amount of protein, incorporating whole foods, making healthy choices, staying well hydrated and practicing mindfulness when eating., and increase protein intake, fibrous foods (25 grams per day for women, 30 grams for men) and water to improve satiety and decrease hunger signals. Discussed using protein shake as 1 meal replacement until she can increase her appetite after having Covid    Recommended Physical Activity Goals  Lanaysia has been advised to work up to 150 minutes of moderate intensity aerobic activity a week and strengthening exercises 2-3 times per week for cardiovascular health, weight loss maintenance and preservation of muscle mass.   She has agreed to  Think about enjoyable ways to increase daily physical activity and overcoming barriers to exercise, Increase physical activity in their day and reduce sedentary time (increase NEAT)., and Start aerobic activity with a goal of 150 minutes a week at moderate intensity.    Pharmacotherapy We discussed various medication options to help Benedicta with her weight loss efforts and we both agreed to continue nutrition, exercise and behavior modification.  ASSOCIATED CONDITIONS ADDRESSED TODAY  Action/Plan  Essential hypertension Continue Olmesartan  20 mg every day and diltiazem  240 mg  every day Continue Category 1 meal plan  and DASH diet Monitor BP and if consistently >140/90 notify PCP If develops headaches, chest pain, shortness of breath or dizziness go to ER Continue to follow regularly with cardiology  Prediabetes Insulin  resistance Continue Category 1  meal plan, limit simple carbohydrates Continue to increase exercise with current goal of 150 minutes of moderate to high intensity exercise/week.   Paroxysmal atrial fibrillation (HCC)       Continue to follow with cardiology regularly       Continue  Cardizem , Tikosyn  and Xarelto  as directed       Monitor symptoms and monitor for bleeding     Return in about 4 weeks (around 06/28/2024).SABRA She was informed of the importance of frequent follow up visits to maximize her success with intensive lifestyle modifications for her multiple health conditions.   ATTESTASTION STATEMENTS:  Reviewed by clinician on day of visit: allergies, medications, problem list, medical history, surgical history, family history, social history, and previous encounter notes.   I personally spent a total of 30 minutes in the care of the patient today including preparing to see the patient, getting/reviewing separately obtained history, performing a medically appropriate exam/evaluation, counseling and educating, and documenting clinical information in the EHR.   Lisa Cardenas ANP-C

## 2024-06-01 ENCOUNTER — Inpatient Hospital Stay: Attending: Adult Health | Admitting: Adult Health

## 2024-06-01 DIAGNOSIS — Z170 Estrogen receptor positive status [ER+]: Secondary | ICD-10-CM | POA: Diagnosis not present

## 2024-06-01 DIAGNOSIS — C50412 Malignant neoplasm of upper-outer quadrant of left female breast: Secondary | ICD-10-CM

## 2024-06-05 NOTE — Progress Notes (Signed)
 [facility] Cancer Follow up:    Lisa Cardenas, Lisa Deed, NP [address] [place] [address]   DIAGNOSIS: Cancer Staging  Malignant neoplasm of upper-outer quadrant of left breast in female, estrogen receptor positive (HCC) Staging form: Breast, AJCC 8th Edition - Clinical: Stage IA (cT1a, cN0, cM0, G2, ER+, PR+, HER2-) - Signed by Crawford Morna Pickle, NP on 11/01/2019 Stage prefix: Initial diagnosis Histologic grading system: 3 grade system - Pathologic stage from 11/23/2019: Stage IA (pT1b, pN0, cM0, G2, ER+, PR+, HER2-) - Signed by Crawford Morna Pickle, NP on 12/13/2019 Stage prefix: Initial diagnosis Histologic grading system: 3 grade system  I connected with Lisa Cardenas on 06/05/24 at  2:00 PM EST by none and verified that I am speaking with the correct person using two identifiers. I discussed the limitations, risks, security and privacy concerns of performing an evaluation and management service by telephone and the availability of in person appointments. I also discussed with the patient that there may be a patient responsible charge related to this service. The patient expressed understanding and agreed to proceed.   Patient location: home  Provider location:  [facility] office Others participating in call: none    SUMMARY OF ONCOLOGIC HISTORY: Oncology History  Malignant neoplasm of upper-outer quadrant of left breast in female, estrogen receptor positive (HCC)  10/26/2019 Initial Diagnosis   Malignant neoplasm of upper-outer quadrant of left breast in female, estrogen receptor positive (HCC)   11/01/2019 Cancer Staging   Staging form: Breast, AJCC 8th Edition - Clinical: Stage IA (cT1a, cN0, cM0, G2, ER+, PR+, HER2-) - Signed by Crawford Morna Pickle, NP on 11/01/2019   11/03/2019 Genetic Testing   Negative genetic testing. No pathogenic variants identified on the [facility] Breast Cancer STAT Panel + Common Hereditary Cancers Panel. The  report date is 11/03/2019.  The STAT Breast cancer panel offered by [facility] includes sequencing and rearrangement analysis for the following 9 genes:  ATM, BRCA1, BRCA2, CDH1, CHEK2, PALB2, PTEN, STK11 and TP53.    The Common Hereditary Cancers Panel offered by [facility] includes sequencing and/or deletion duplication testing of the following 48 genes: APC, ATM, AXIN2, BARD1, BMPR1A, BRCA1, BRCA2, BRIP1, CDH1, CDKN2A (p14ARF), CDKN2A (p16INK4a), CKD4, CHEK2, CTNNA1, DICER1, EPCAM (Deletion/duplication testing only), GREM1 (promoter region deletion/duplication testing only), KIT, MEN1, MLH1, MSH2, MSH3, MSH6, MUTYH, NBN, NF1, NHTL1, PALB2, PDGFRA, PMS2, POLD1, POLE, PTEN, RAD50, RAD51C, RAD51D, RNF43, SDHB, SDHC, SDHD, SMAD4, SMARCA4. STK11, TP53, TSC1, TSC2, and VHL.  The following genes were evaluated for sequence changes only: SDHA and HOXB13 c.251G>A variant only.   11/23/2019 Cancer Staging   Staging form: Breast, AJCC 8th Edition - Pathologic stage from 11/23/2019: Stage IA (pT1b, pN0, cM0, G2, ER+, PR+, HER2-) - Signed by Crawford Morna Pickle, NP on 12/13/2019   11/23/2019 Surgery   status post left lumpectomy and sentinel lymph node sampling for a pT1b pN0, stage IA invasive lobular carcinoma, with negative margins    11/23/2019 Oncotype testing   18/5%   01/02/2020 - 01/24/2020 Radiation Therapy   Site Technique Total Dose (Gy) Dose per Fx (Gy) Completed Fx Beam Energies  Breast, Left: Breast_Lt 3D 42.56/42.56 2.66 16/16 10X, 15X     02/2020 -  Anti-estrogen oral therapy   Anastrozole      CURRENT THERAPY: Anastrozole   INTERVAL HISTORY: Following up with patient about recent COVID infection as she had to have CT high res canceled due to it.  She tells me she is feeling much better since when  we talked a few weeks ago and she was struggling with COVID.     Patient Active Problem List   Diagnosis Date Noted   SOB (shortness of breath) on exertion 04/04/2024   OSA (obstructive sleep  apnea) 12/23/2023   Atypical atrial flutter (HCC) 11/16/2023   High risk medications (not anticoagulants) long-term use 08/04/2023   Hypercoagulable state due to paroxysmal atrial fibrillation (HCC) 11/23/2022   Essential hypertension 10/25/2022   Trigeminal neuralgia of left side of face 01/26/2020   Depression screening 11/07/2019   Malignant neoplasm of upper-outer quadrant of left breast in female, estrogen receptor positive (HCC) 10/26/2019   Atrial fibrillation (HCC) 10/26/2019   Chronic anticoagulation 10/26/2019   Morbid obesity with BMI of 40.0-44.9, adult (HCC) 10/26/2019   Family history of breast cancer    Family history of pancreatic cancer    Family history of prostate cancer    Family history of melanoma     is allergic to benadryl [diphenhydramine], ciprofloxacin , hydroxychloroquine, and topamax [topiramate].  MEDICAL HISTORY: Past Medical History:  Diagnosis Date   A-fib [facility])    no issues since May 2020   Anxiety    Arthritis of knee    bilateral   Cancer (HCC)    Constipation    Diverticulosis    Elevated cholesterol    GERD (gastroesophageal reflux disease)    High cholesterol    Hypertension    IBS (irritable bowel syndrome)    Joint pain    Lactose intolerance    Obesity    OSA (obstructive sleep apnea)    Osteoarthritis    Palpitations    Pancreatitis    Prediabetes    Thyroid disease    Trigeminal neuralgia    Vitamin D  deficiency     SURGICAL HISTORY: Past Surgical History:  Procedure Laterality Date   ABDOMINAL HYSTERECTOMY  1980   BLADDER REPAIR     BREAST BIOPSY Left 11/2019   BREAST LUMPECTOMY Left 11/23/2019   BREAST LUMPECTOMY WITH RADIOACTIVE SEED AND SENTINEL LYMPH NODE BIOPSY Left 11/23/2019   Procedure: LEFT BREAST LUMPECTOMY WITH RADIOACTIVE SEED AND LEFT AXILLARY SENTINEL LYMPH NODE BIOPSY;  Surgeon: Ebbie Cough, MD;  Location: [facility];  Service: General;  Laterality: Left;  PEC BLOCK   CEREBRAL  MICROVASCULAR DECOMPRESSION     2021   [facility]     every 3 years   [facility]ENOSCOPY     [facility]   KNEE ARTHROSCOPY Left    KNEE ARTHROSCOPY     1992   TONSILLECTOMY      SOCIAL HISTORY: Social History   Socioeconomic History   Marital status: Married    Spouse name: Dalaysia Harms   Number of children: 3   Years of education: Not on file   Highest education level: Not on file  Occupational History   Occupation: Retired RN,operation room  Tobacco Use   Smoking status: Never   Smokeless tobacco: Never   Tobacco comments:    Never smoked 11/16/23  Vaping Use   Vaping status: Never Used  Substance and Sexual Activity   Alcohol use: Not Currently   Drug use: Not Currently   Sexual activity: Not on file  Other Topics Concern   Not on file  Social History Narrative   Not on file   Social Drivers of Health   Financial Resource Strain: Not on file  Food Insecurity: No Food Insecurity (11/16/2023)   Hunger Vital Sign  Worried About Programme Researcher, Broadcasting/film/video in the Last Year: Never true    Ran Out of Food in the Last Year: Never true  Transportation Needs: No Transportation Needs (11/16/2023)   PRAPARE - Administrator, Civil Service (Medical): No    Lack of Transportation (Non-Medical): No  Physical Activity: Not on file  Stress: Not on file  Social Connections: Unknown (11/16/2023)   Social Connection and Isolation Panel    Frequency of Communication with Friends and Family: Three times a week    Frequency of Social Gatherings with Friends and Family: Three times a week    Attends Religious Services: More than 4 times per year    Active Member of Clubs or Organizations: No    Attends Banker Meetings: Patient declined    Marital Status: Patient declined  Intimate Partner Violence: Not At Risk (11/16/2023)   Humiliation, Afraid, Rape, and Kick questionnaire    Fear of Current or Ex-Partner: No    Emotionally Abused: No     Physically Abused: No    Sexually Abused: No    FAMILY HISTORY: Family History  Problem Relation Age of Onset   Anxiety disorder Mother    Cancer Mother    Stroke Mother    Hypertension Mother    Colon cancer Mother        dx 12   Thyroid disease Mother    Melanoma Daughter    Breast cancer Maternal Aunt    Breast cancer Maternal Aunt    Prostate cancer Maternal Uncle    Leukemia Maternal Uncle    Breast cancer Paternal Aunt    Breast cancer Paternal Aunt    Colon cancer Paternal Aunt    Melanoma Paternal Uncle    Breast cancer Maternal Grandmother     Review of Systems  Constitutional:  Negative for appetite change, chills, fatigue, fever and unexpected weight change.  HENT:   Negative for hearing loss, lump/mass and trouble swallowing.   Eyes:  Negative for eye problems and icterus.  Respiratory:  Negative for chest tightness, cough and shortness of breath.   Cardiovascular:  Negative for chest pain, leg swelling and palpitations.  Gastrointestinal:  Negative for abdominal distention, abdominal pain, constipation, diarrhea, nausea and vomiting.  Endocrine: Negative for hot flashes.  Genitourinary:  Negative for difficulty urinating.   Musculoskeletal:  Negative for arthralgias.  Skin:  Negative for itching and rash.  Neurological:  Negative for dizziness, extremity weakness, headaches and numbness.  Hematological:  Negative for adenopathy. Does not bruise/bleed easily.  Psychiatric/Behavioral:  Negative for depression. The patient is not nervous/anxious.       PHYSICAL EXAMINATION  Patient sounds well,in no apparent distress, mood and behavior are normal, speech is normal   ASSESSMENT and THERAPY PLAN:   Stage IA left breast cancer s/p lumpectomy, adjuvant radiation, and antiestrogen therapy with Anastrozole .    Continue anastrozole  daily Continue annual mammograms Follow up with Dr. Lanny in 10/2024.    Interstitial lung changes and pulmonary nodule on  previous CT scan at outside hospital  Will obtain CT high res in January, 2026 and will see patient a couple days after undergoing scan to review results.    RTC as noted above.     The patient was provided an opportunity to ask questions and all were answered. The patient agreed with the plan and demonstrated an understanding of the instructions.   The patient was advised to call back or seek an in-person evaluation if  the symptoms worsen or if the condition fails to improve as anticipated.   I provided 10 minutes of non face-to-face telephone visit time during this encounter, and > 50% was spent counseling as documented under my assessment & plan.     Morna Kendall, NP 06/05/24 3:28 PM Medical Oncology and Hematology [facility] [address] [place], [place] [address] Tel. [phone]    Fax. [phone]  *Total Encounter Time as defined by the Centers for Medicare and Medicaid Services includes, in addition to the face-to-face time of a patient visit (documented in the note above) non-face-to-face time: obtaining and reviewing outside history, ordering and reviewing medications, tests or procedures, care coordination (communications with other health care professionals or caregivers) and documentation in the medical record.

## 2024-06-15 ENCOUNTER — Other Ambulatory Visit (HOSPITAL_COMMUNITY): Payer: Self-pay | Admitting: *Deleted

## 2024-06-15 MED ORDER — DOFETILIDE 500 MCG PO CAPS: 500.0000 ug | ORAL_CAPSULE | Freq: Two times a day (BID) | ORAL | 6 refills | Status: AC

## 2024-06-19 DIAGNOSIS — I272 Pulmonary hypertension, unspecified: Secondary | ICD-10-CM | POA: Insufficient documentation

## 2024-06-19 NOTE — Progress Notes (Unsigned)
  Cardiology Office Note:   Date:  06/19/2024  ID:  Lesly, Pontarelli 1951/06/20, MRN [id number] PCP: Kristine Corean Deed, NP   [facility] Providers Cardiologist:  Lynwood Schilling, MD Electrophysiologist:  Will Gladis Norton, MD {  History of Present Illness:   Lisa Cardenas is a 73 y.o. female who presents for evaluation of atrial fibrillation.  She has previously been seen in [place] .  She has had paroxysmal atrial fibrillation since 2019.  She was treated with sotalol.  Echocardiogram previously has demonstrated well-preserved ejection fraction.  She is currently controlled on Tikosyn .   She has been found to have severe obstructive sleep apnea.     Since I last saw her ***   ***  her palpitations are improved.  The patient denies any new symptoms such as chest discomfort, neck or arm discomfort. There has been no new shortness of breath, PND or orthopnea. There have been no reported palpitations, presyncope or syncope.   She might have a rare skipped beat.  She is doing some physical therapy but she is limited by bilateral knee pain.   Of note late last year she had severe sleep apnea on screening stress testing but she did not answer the phone calls when she was called to be scheduled for follow-up.  ROS: ***  Studies Reviewed:    EKG:       ***  Risk Assessment/Calculations:   {Does this patient have ATRIAL FIBRILLATION?[phone]} No BP recorded.  {Refresh Note OR Click here to enter BP  :1}***        Physical Exam:   VS:  There were no vitals taken for this visit.   Wt Readings from Last 3 Encounters:  05/31/24 238 lb (108 kg)  05/03/24 241 lb (109.3 kg)  04/19/24 243 lb (110.2 kg)     GEN: Well nourished, well developed in no acute distress NECK: No JVD; No carotid bruits CARDIAC: ***RR, *** murmurs, rubs, gallops RESPIRATORY:  Clear to auscultation without rales, wheezing or rhonchi  ABDOMEN: Soft, non-tender,  non-distended EXTREMITIES:  No edema; No deformity   ASSESSMENT AND PLAN:   Hypertension:   Her blood pressure is ***  at target.  No change in therapy.     Atrial fibrillation: ***   She is tolerating anticoagulation.  She is tolerating Tikosyn .  I will check potassium and magnesium  in about 3 months.  She will continue the meds as listed.   Sleep apnea: ***  She had severe sleep apnea.  We had a long conversation about this again today and she consents to being seen and treated.  We will arrange follow-up.   Pulmonary HTN:  ***  This was mild on [place] echo.  She also had some mild TR and AI.  This was 2018.  I will follow-up with an echocardiogram.   Morbid obesity: ***  She consents to being seen by [facility] and I will make this referral.        Follow up ***  Signed, Lynwood Schilling, MD

## 2024-06-21 ENCOUNTER — Encounter: Payer: Self-pay | Admitting: Cardiology

## 2024-06-21 ENCOUNTER — Ambulatory Visit: Attending: Cardiology | Admitting: Cardiology

## 2024-06-21 VITALS — BP 120/68 | HR 74 | Ht 64.0 in | Wt 241.0 lb

## 2024-06-21 DIAGNOSIS — I272 Pulmonary hypertension, unspecified: Secondary | ICD-10-CM | POA: Insufficient documentation

## 2024-06-21 DIAGNOSIS — G4733 Obstructive sleep apnea (adult) (pediatric): Secondary | ICD-10-CM | POA: Diagnosis not present

## 2024-06-21 DIAGNOSIS — I482 Chronic atrial fibrillation, unspecified: Secondary | ICD-10-CM | POA: Insufficient documentation

## 2024-06-21 NOTE — Patient Instructions (Signed)

## 2024-06-29 ENCOUNTER — Ambulatory Visit (INDEPENDENT_AMBULATORY_CARE_PROVIDER_SITE_OTHER): Admitting: Nurse Practitioner

## 2024-06-29 ENCOUNTER — Inpatient Hospital Stay: Admission: RE | Admit: 2024-06-29 | Discharge: 2024-06-29 | Attending: Adult Health | Admitting: Adult Health

## 2024-06-29 VITALS — BP 117/73 | HR 74 | Temp 97.7°F | Ht 64.0 in | Wt 235.0 lb

## 2024-06-29 DIAGNOSIS — Z6841 Body Mass Index (BMI) 40.0 and over, adult: Secondary | ICD-10-CM | POA: Diagnosis not present

## 2024-06-29 DIAGNOSIS — I480 Paroxysmal atrial fibrillation: Secondary | ICD-10-CM

## 2024-06-29 DIAGNOSIS — R921 Mammographic calcification found on diagnostic imaging of breast: Secondary | ICD-10-CM

## 2024-06-29 DIAGNOSIS — E559 Vitamin D deficiency, unspecified: Secondary | ICD-10-CM | POA: Diagnosis not present

## 2024-06-29 DIAGNOSIS — E88819 Insulin resistance, unspecified: Secondary | ICD-10-CM

## 2024-06-29 DIAGNOSIS — I10 Essential (primary) hypertension: Secondary | ICD-10-CM

## 2024-06-29 DIAGNOSIS — E66813 Obesity, class 3: Secondary | ICD-10-CM | POA: Diagnosis not present

## 2024-06-29 DIAGNOSIS — Z853 Personal history of malignant neoplasm of breast: Secondary | ICD-10-CM

## 2024-06-29 NOTE — Progress Notes (Signed)
 Office: [phone]  /  Fax: [phone]  WEIGHT SUMMARY AND BIOMETRICS  Weight Lost Since Last Visit: 3 lb  Weight Gained Since Last Visit: 0   Vitals Temp: 97.7 F (36.5 C) BP: 117/73 Pulse Rate: 74 SpO2: 97 %   Anthropometric Measurements Height: 5' 4 (1.626 m) Weight: 235 lb (106.6 kg) BMI (Calculated): 40.32 Weight at Last Visit: 238 lb Weight Lost Since Last Visit: 3 lb Weight Gained Since Last Visit: 0 Starting Weight: 249 lb Total Weight Loss (lbs): 14 lb (6.35 kg) Peak Weight: 260 lb   Body Composition  Body Fat %: 52.5 % Fat Mass (lbs): 123.6 lbs Muscle Mass (lbs): 106.4 lbs Total Body Water (lbs): 77 lbs Visceral Fat Rating : 18   Other Clinical Data Fasting: yes Labs: no Today's Visit #: 5 Starting Date: 04/04/24    Total Weight Loss: 14 pounds Percent of body weight lost: 5.6%  Bio Impedance Data reviewed with patient: Muscle is up 0.2 pounds and adipose is down 3 pounds.   HPI  Chief Complaint: OBESITY  Lisa Cardenas is here to discuss her progress with her obesity treatment plan. She is on the the Category 1 Plan and states she is following her eating plan approximately 80 % of the time. She states she is exercising 90 minutes 2 days per week of PT and does walking, leg and arm exercises the other 4 days.   Interval History:  Since last office visit she has been getting at least 60-80 grams of protein daily.  She has not been drinking as much water- forgets to drink. She does think the fairlife chocolate milk- too thick.  She has not been eating as much fruits and vegetables. She continues to have additional stress with her daughters not talking to each other.  She did go to hotel for Thanksgiving with her husband and it was good. For Christmas she will be home.  She has not been hungry but is trying to stick to protein and calorie goal. She has been eating chili and brunswick stew.    Lisa Cardenas does have hypertension and her BP's are currently  well controlled on Cardizem  240 mg every day and olmesartan 20 mg every day. Denies headaches, chest pain shortness of breath at rest and dizziness  BP Readings from Last 3 Encounters:  06/29/24 117/73  06/21/24 120/68  05/31/24 125/71   She continues on Tikosyn  500 mcg BID and Xarelto  20 mg every day with Cardizem   60 mcg PRN for breakthrough Atrial fibrillation.  Symptoms are currently controlled and denies Chest pain, palpitations and bleeding. Did have 1 episode of breakthrough A fib over Thanksgiving and took an extra  Cardizem  60 mg. Took about 24 hours to resolve. She did see Dr. Lavona last week and he discussed ablation and she does not currently want to proceed.   She does have a history of Vit D deficiency. She continues on Ergocalciferol  50000 units once a week and denies symptoms.   She also has insulin  resistance and continues to work on nutrition, exercise and weight loss to improve. Unable to take Metformin due to Tikosyn    PHYSICAL EXAM:  Blood pressure 117/73, pulse 74, temperature 97.7 F (36.5 C), height 5' 4 (1.626 m), weight 235 lb (106.6 kg), SpO2 97%. Body mass index is 40.34 kg/m.  General: Well Developed, well nourished, and in no acute distress.  HEENT: Normocephalic, atraumatic; EOMI, sclerae are anicteric. Skin: Warm and dry, good turgor Chest:  Normal excursion, shape, no gross ABN  Respiratory: No conversational dyspnea; speaking in full sentences NeuroM-Sk:  Normal gross ROM with slightly antalgic gait with cane due to knee pain. Currently left knee is worse than right.  Psych: A and O X 3, insight adequate, mood- full    DIAGNOSTIC DATA REVIEWED:  Last metabolic panel Lab Results  Component Value Date   GLUCOSE 88 05/03/2024   NA 140 05/03/2024   K 4.5 05/03/2024   CL 103 05/03/2024   CO2 21 05/03/2024   BUN 16 05/03/2024   CREATININE 0.82 05/03/2024   EGFR 76 05/03/2024   CALCIUM  10.0 05/03/2024   PROT 7.0 02/28/2024   ALBUMIN 4.3  02/28/2024   LABGLOB 2.3 11/11/2023   BILITOT 0.6 02/28/2024   ALKPHOS 84 02/28/2024   AST 12 (L) 02/28/2024   ALT 19 02/28/2024   ANIONGAP 8 02/28/2024    Lab Results  Component Value Date   INSULIN  30.9 (H) 04/04/2024   Lab Results  Component Value Date   TSH 2.360 11/11/2023   CBC    Component Value Date/Time   WBC 6.3 02/28/2024 1108   WBC 8.9 12/23/2022 1200   RBC 5.14 (H) 02/28/2024 1108   HGB 13.8 02/28/2024 1108   HGB 15.2 11/26/2023 1107   HCT 42.2 02/28/2024 1108   HCT 48.4 (H) 11/26/2023 1107   PLT 210 02/28/2024 1108   PLT 273 11/26/2023 1107   MCV 82.1 02/28/2024 1108   MCV 85 11/26/2023 1107   MCH 26.8 02/28/2024 1108   MCHC 32.7 02/28/2024 1108   RDW 15.3 02/28/2024 1108   RDW 14.8 11/26/2023 1107     ASSESSMENT AND PLAN  Class 3 severe obesity with serious comorbidity and body mass index (BMI) of 40.0 to 44.9 in adult, unspecified obesity type (HCC)  TREATMENT PLAN FOR OBESITY:  Recommended Dietary Goals  Lisa Cardenas is currently in the action stage of change. As such, her goal is to continue weight management plan. She has agreed to the Category 2 Plan.  Behavioral Intervention  We discussed the following Behavioral Modification Strategies today: increasing lean protein intake to established goals, increasing vegetables, increasing lower glycemic fruits, increasing water intake , celebration eating strategies, continue to work on maintaining a reduced calorie state, getting the recommended amount of protein, incorporating whole foods, making healthy choices, staying well hydrated and practicing mindfulness when eating., and increase protein intake, fibrous foods (25 grams per day for women, 30 grams for men) and water to improve satiety and decrease hunger signals. Given handout of common proteins and calories/protein content   She wants to lose weight in December - She does  recognize that she must follow a structured plan and will eat before social  situation to meet her protein goals and minimze party foods - She will give away food gifts unless they are very low calories or high protein - She will avoid calorie-containing liquids such as holiday drinks, eggnog and alcohol -She will stick to her structured plan strictly most of the time - This strategy should help her lose 1-2 pounds in December   Recommended Physical Activity Goals  Lisa Cardenas has been advised to work up to 150 minutes of moderate intensity aerobic activity a week and strengthening exercises 2-3 times per week for cardiovascular health, weight loss maintenance and preservation of muscle mass.   She has agreed to Continue current level of physical activity  and Increase physical activity in their day and reduce sedentary time (increase NEAT).   Pharmacotherapy We discussed various medication options to  help Lisa Cardenas with her weight loss efforts and we both agreed to continue nutrition and behavior modification.  ASSOCIATED CONDITIONS ADDRESSED TODAY  Action/Plan  Insulin  resistance Continue Category 2  meal plan, limit simple carbohydrates. Increase water, fiber and lean protein Continue to follow regularly with PCP- has appointment in 08/2024 Continue exercise with current goal of 150 minutes of moderate to high intensity exercise/week.   Vitamin D  deficiency Low vitamin D  levels can be associated with adiposity and may result in leptin resistance and weight gain. Also associated with fatigue.  Currently on vitamin D  supplementation - ergocalciferol  50000 units weekly through her PCP, without any adverse effects such as nausea, vomiting or muscle weakness.    Essential hypertension Continue Cardizem  240 mg every day and olmesartan 20 mg every day Continue Category 2 meal plan  and DASH diet Monitor BP and if consistently >140/90 notify PCP If develops headaches, chest pain, shortness of breath or dizziness go to ER Recent appointment with cardiologist and will  continue current regimen  Paroxysmal atrial fibrillation (HCC)       Continues on Tikosyn  500 mcg BID and Xarelto  20 mg every day with Cardizem   60 mcg PRN for breakthrough Atrial fibrillation.       Recent visit with cardiology and discussed ablation but wishes to continue medication.        Monitor symptoms and follow regularly with Cardiology         Return in about 4 weeks (around 07/27/2024).SABRA She was informed of the importance of frequent follow up visits to maximize her success with intensive lifestyle modifications for her multiple health conditions.   ATTESTASTION STATEMENTS:  Reviewed by clinician on day of visit: allergies, medications, problem list, medical history, surgical history, family history, social history, and previous encounter notes.   I personally spent a total of 36 minutes in the care of the patient today including preparing to see the patient, getting/reviewing separately obtained history, performing a medically appropriate exam/evaluation, counseling and educating, and documenting clinical information in the EHR.   Lisa Cardenas ANP-C

## 2024-07-27 ENCOUNTER — Encounter: Payer: Self-pay | Admitting: Adult Health

## 2024-08-02 ENCOUNTER — Ambulatory Visit (INDEPENDENT_AMBULATORY_CARE_PROVIDER_SITE_OTHER): Admitting: Nurse Practitioner

## 2024-08-02 ENCOUNTER — Encounter (INDEPENDENT_AMBULATORY_CARE_PROVIDER_SITE_OTHER): Payer: Self-pay | Admitting: Nurse Practitioner

## 2024-08-02 VITALS — BP 118/74 | HR 56 | Temp 98.8°F | Ht 64.0 in | Wt 235.0 lb

## 2024-08-02 DIAGNOSIS — I480 Paroxysmal atrial fibrillation: Secondary | ICD-10-CM | POA: Diagnosis not present

## 2024-08-02 DIAGNOSIS — E039 Hypothyroidism, unspecified: Secondary | ICD-10-CM

## 2024-08-02 DIAGNOSIS — E66813 Obesity, class 3: Secondary | ICD-10-CM

## 2024-08-02 DIAGNOSIS — I10 Essential (primary) hypertension: Secondary | ICD-10-CM | POA: Diagnosis not present

## 2024-08-02 DIAGNOSIS — Z6841 Body Mass Index (BMI) 40.0 and over, adult: Secondary | ICD-10-CM

## 2024-08-02 DIAGNOSIS — E559 Vitamin D deficiency, unspecified: Secondary | ICD-10-CM

## 2024-08-02 NOTE — Progress Notes (Signed)
 " Office: [phone]  /  Fax: [phone]  WEIGHT SUMMARY AND BIOMETRICS  Weight Lost Since Last Visit: 0  Weight Gained Since Last Visit: 0   Vitals Temp: 98.8 F (37.1 C) BP: 118/74 Pulse Rate: (!) 56 SpO2: 100 %   Anthropometric Measurements Height: 5' 4 (1.626 m) Weight: 235 lb (106.6 kg) BMI (Calculated): 40.32 Weight at Last Visit: 235 lb Weight Lost Since Last Visit: 0 Weight Gained Since Last Visit: 0 Starting Weight: 249 lb Total Weight Loss (lbs): 14 lb (6.35 kg) Peak Weight: 260 lb   Body Composition  Body Fat %: 52 % Fat Mass (lbs): 122.6 lbs Muscle Mass (lbs): 107.4 lbs Total Body Water (lbs): 78.4 lbs Visceral Fat Rating : 18   Other Clinical Data Fasting: yes Labs: no Today's Visit #: 6 Starting Date: 04/04/24    Total Weight Loss:14 pounds Percent of body weight lost: 5.6%   Bio Impedance Data reviewed with patient: Muscle is up 1 pound. Adipose is down 1 pound.   HPI  Chief Complaint: OBESITY  Lisa Cardenas is here to discuss her progress with her obesity treatment plan. Lisa Cardenas is on the the Category 1 Plan and states Lisa Cardenas is following her eating plan approximately 50 % of the time. Lisa Cardenas states Lisa Cardenas is doing PT 2 days a week for 90 minutes, house walking 15 minutes 7 days a week.    Interval History:  Since last office visit Lisa Cardenas did have covid and flu the past month. Started 07/06/24- continues continues to have sinus congestion and post nasal drip. No current medications. Lisa Cardenas lost taste and smell but has returned. Lisa Cardenas had 1 episode of A fib that lasted 36 hours, improved  with extra diltiazem . Lisa Cardenas has been eating more comfort foods.  Lisa Cardenas has gotten back to getting 80 grams of protein daily.  Lisa Cardenas will occasionally skip a meal- usually gets up late eats breakfast and not lunch.  Her appetite is very decreased since getting sick. Doing greek yogurt.  Dinner has been steak, chicken with vegetables.  Snacking on Yasso bars.  Water intake remains  good at 48 ounces a day.   Lisa Cardenas has not been able to get her screening lung CA CT due to Covid so now scheduled for March.   Lisa Cardenas does have hypertension and her BP's are currently well controlled on Olmesartan 20 mg every day and diltiazem  240 mg every day. Denies headaches, chest pain shortness of breath at rest and dizziness  BP Readings from Last 3 Encounters:  08/02/24 118/74  06/29/24 117/73  06/21/24 120/68   Lisa Cardenas continues on Xarelto  20 mg every day, Tikosyn  500 mcg BID and diltiazem  240 mg every day for Paroxysmal atrial fibrillation.  Lisa Cardenas does take an extra Cardizem  60 mg for breakthrough atrial fibrillation.   Lisa Cardenas continues on Levothyroxine  137 mcg daily for hypothyroidism.  Lab Results  Component Value Date   TSH 2.360 11/11/2023     PHYSICAL EXAM:  Blood pressure 118/74, pulse (!) 56, temperature 98.8 F (37.1 C), height 5' 4 (1.626 m), weight 235 lb (106.6 kg), SpO2 100%. Body mass index is 40.34 kg/m.  General: Well Developed, well nourished, and in no acute distress.  HEENT: Normocephalic, atraumatic; EOMI, sclerae are anicteric. Skin: Warm and dry, good turgor Chest:  Normal excursion, shape, no gross ABN Respiratory: No conversational dyspnea; speaking in full sentences NeuroM-Sk:  Normal gross ROM * 4 extremities, antalgic gait with cane  Psych: A and O X 3, insight adequate, mood- full  DIAGNOSTIC DATA REVIEWED:  BMET    Component Value Date/Time   NA 140 05/03/2024 1404   K 4.5 05/03/2024 1404   CL 103 05/03/2024 1404   CO2 21 05/03/2024 1404   GLUCOSE 88 05/03/2024 1404   GLUCOSE 116 (H) 02/28/2024 1108   BUN 16 05/03/2024 1404   CREATININE 0.82 05/03/2024 1404   CREATININE 0.78 02/28/2024 1108   CALCIUM  10.0 05/03/2024 1404   GFRNONAA >60 02/28/2024 1108   GFRAA >60 11/20/2019 1100   GFRAA >60 10/26/2019 1507   No results found for: HGBA1C Lab Results  Component Value Date   INSULIN  30.9 (H) 04/04/2024   Lab Results  Component Value  Date   TSH 2.360 11/11/2023   CBC    Component Value Date/Time   WBC 6.3 02/28/2024 1108   WBC 8.9 12/23/2022 1200   RBC 5.14 (H) 02/28/2024 1108   HGB 13.8 02/28/2024 1108   HGB 15.2 11/26/2023 1107   HCT 42.2 02/28/2024 1108   HCT 48.4 (H) 11/26/2023 1107   PLT 210 02/28/2024 1108   PLT 273 11/26/2023 1107   MCV 82.1 02/28/2024 1108   MCV 85 11/26/2023 1107   MCH 26.8 02/28/2024 1108   MCHC 32.7 02/28/2024 1108   RDW 15.3 02/28/2024 1108   RDW 14.8 11/26/2023 1107      ASSESSMENT AND PLAN Class 3 severe obesity with serious comorbidity and body mass index (BMI) of 40.0 to 44.9 in adult, unspecified obesity type (HCC) TREATMENT PLAN FOR OBESITY:  Recommended Dietary Goals  Lisa Cardenas is currently in the action stage of change. As such, her goal is to continue weight management plan. Lisa Cardenas has agreed to the Category 1 Plan.  Behavioral Intervention  We discussed the following Behavioral Modification Strategies today: increasing lean protein intake to established goals, decreasing simple carbohydrates , increasing fiber rich foods, avoiding skipping meals, increasing water intake , and continue to work on maintaining a reduced calorie state, getting the recommended amount of protein, incorporating whole foods, making healthy choices, staying well hydrated and practicing mindfulness when eating..    Recommended Physical Activity Goals  Lisa Cardenas has been advised to work up to 150 minutes of moderate intensity aerobic activity a week and strengthening exercises 2-3 times per week for cardiovascular health, weight loss maintenance and preservation of muscle mass.   Lisa Cardenas has agreed to continue aerobic activity with a goal of 150 minutes a week at moderate intensity.    Pharmacotherapy We discussed various medication options to help Lisa Cardenas with her weight loss efforts and we both agreed to continue nutrition and behavior modification.  ASSOCIATED CONDITIONS ADDRESSED  TODAY  Action/Plan  Essential hypertension Continue Olmesartan 20 mg every day and diltiazem  240 mg every day Continue Category 1 meal plan  and DASH diet Monitor BP and if consistently >140/90 notify PCP If develops headaches, chest pain, shortness of breath or dizziness go to ER   Vitamin D  deficiency        Low vitamin D  levels can be associated with adiposity and may result in leptin resistance and weight gain. Also associated with fatigue.         Currently on vitamin D  supplementation- ergocalciferol  50000 units once a week through her PCP without any adverse effects such as nausea, vomiting or muscle weakness.    Paroxysmal atrial fibrillation (HCC)       Continue Xarelto  20 mg every day, Tikosyn  500 mcg BID and diltiazem  240 mg every day for Paroxysmal atrial fibrillation.  Lisa Cardenas does  take an extra Cardizem  60 mg for breakthrough atrial fibrillation       Continue to monitor for bleeding and monitor heart rhythm.  Continue to follow regularly with Cardiology  Hypothyroidism, unspecified type       Continue Levothyroxine  137 mcg daily       Continue to follow routinely with PCP         Return in about 4 weeks (around 08/30/2024).SABRA Lisa Cardenas was informed of the importance of frequent follow up visits to maximize her success with intensive lifestyle modifications for her multiple health conditions.   ATTESTASTION STATEMENTS:  Reviewed by clinician on day of visit: allergies, medications, problem list, medical history, surgical history, family history, social history, and previous encounter notes.   I personally spent a total of 30 minutes in the care of the patient today including preparing to see the patient, getting/reviewing separately obtained history, performing a medically appropriate exam/evaluation, counseling and educating, and documenting clinical information in the EHR.   Lonell Liverpool ANP-C "

## 2024-08-07 ENCOUNTER — Ambulatory Visit (HOSPITAL_COMMUNITY)

## 2024-08-07 ENCOUNTER — Inpatient Hospital Stay: Admitting: Adult Health

## 2024-08-10 ENCOUNTER — Inpatient Hospital Stay: Admitting: Adult Health

## 2024-08-31 ENCOUNTER — Ambulatory Visit (INDEPENDENT_AMBULATORY_CARE_PROVIDER_SITE_OTHER): Admitting: Nurse Practitioner

## 2024-09-12 ENCOUNTER — Ambulatory Visit (HOSPITAL_COMMUNITY)

## 2024-09-19 ENCOUNTER — Inpatient Hospital Stay: Attending: Adult Health | Admitting: Adult Health

## 2024-10-02 ENCOUNTER — Ambulatory Visit (HOSPITAL_COMMUNITY): Admitting: Internal Medicine

## 2024-10-30 ENCOUNTER — Inpatient Hospital Stay: Admitting: Hematology

## 2024-10-30 ENCOUNTER — Inpatient Hospital Stay: Attending: Adult Health
